# Patient Record
Sex: Female | Born: 1950 | Race: White | Hispanic: No | Marital: Married | Smoking: Never smoker
Health system: Southern US, Community
[De-identification: ages and names within clinical notes are randomized; demographics above are authoritative.]

## PROBLEM LIST (undated history)

## (undated) DIAGNOSIS — I499 Cardiac arrhythmia, unspecified: Secondary | ICD-10-CM

## (undated) DIAGNOSIS — F329 Major depressive disorder, single episode, unspecified: Secondary | ICD-10-CM

## (undated) DIAGNOSIS — F988 Other specified behavioral and emotional disorders with onset usually occurring in childhood and adolescence: Secondary | ICD-10-CM

## (undated) DIAGNOSIS — I639 Cerebral infarction, unspecified: Secondary | ICD-10-CM

## (undated) DIAGNOSIS — I341 Nonrheumatic mitral (valve) prolapse: Secondary | ICD-10-CM

## (undated) DIAGNOSIS — I480 Paroxysmal atrial fibrillation: Secondary | ICD-10-CM

## (undated) DIAGNOSIS — I514 Myocarditis, unspecified: Secondary | ICD-10-CM

## (undated) DIAGNOSIS — F32A Depression, unspecified: Secondary | ICD-10-CM

## (undated) DIAGNOSIS — G4730 Sleep apnea, unspecified: Secondary | ICD-10-CM

## (undated) DIAGNOSIS — C801 Malignant (primary) neoplasm, unspecified: Secondary | ICD-10-CM

## (undated) HISTORY — PX: CATARACT EXTRACTION: SUR2

## (undated) HISTORY — PX: OTHER SURGICAL HISTORY: SHX169

## (undated) HISTORY — DX: Myocarditis, unspecified: I51.4

## (undated) HISTORY — DX: Paroxysmal atrial fibrillation: I48.0

## (undated) HISTORY — DX: Cardiac arrhythmia, unspecified: I49.9

## (undated) HISTORY — PX: OOPHORECTOMY: SHX86

## (undated) HISTORY — DX: Cerebral infarction, unspecified: I63.9

## (undated) HISTORY — DX: Malignant (primary) neoplasm, unspecified: C80.1

---

## 1991-04-02 HISTORY — PX: BREAST SURGERY: SHX581

## 1999-04-02 DIAGNOSIS — C801 Malignant (primary) neoplasm, unspecified: Secondary | ICD-10-CM

## 1999-04-02 HISTORY — PX: BREAST LUMPECTOMY: SHX2

## 1999-04-02 HISTORY — DX: Malignant (primary) neoplasm, unspecified: C80.1

## 2009-08-15 ENCOUNTER — Encounter: Admission: RE | Admit: 2009-08-15 | Discharge: 2009-08-15 | Payer: Self-pay | Admitting: Family Medicine

## 2013-04-01 DIAGNOSIS — I639 Cerebral infarction, unspecified: Secondary | ICD-10-CM

## 2013-04-01 HISTORY — DX: Cerebral infarction, unspecified: I63.9

## 2013-07-20 ENCOUNTER — Ambulatory Visit (INDEPENDENT_AMBULATORY_CARE_PROVIDER_SITE_OTHER): Payer: No Typology Code available for payment source | Admitting: General Surgery

## 2013-07-20 ENCOUNTER — Telehealth (INDEPENDENT_AMBULATORY_CARE_PROVIDER_SITE_OTHER): Payer: Self-pay | Admitting: *Deleted

## 2013-07-20 ENCOUNTER — Encounter (INDEPENDENT_AMBULATORY_CARE_PROVIDER_SITE_OTHER): Payer: Self-pay | Admitting: General Surgery

## 2013-07-20 VITALS — BP 126/70 | HR 72 | Temp 98.7°F | Resp 14 | Ht 66.0 in | Wt 150.4 lb

## 2013-07-20 DIAGNOSIS — R928 Other abnormal and inconclusive findings on diagnostic imaging of breast: Secondary | ICD-10-CM

## 2013-07-20 NOTE — Patient Instructions (Signed)
The small mass in your right breast has been biopsied. It shows a complex sclerosing lesion or possibly a radial scar. This is not cancer, but there is low but definite risk that there could be an early breast cancer in the same area.  Because of this, and because of your past history of breast cancer, this area should be conservatively excised, as we have discussed.  You will be scheduled for a right breast lumpectomy with radioactive seed localization in the near future.     Lumpectomy A lumpectomy is a form of "breast conserving" or "breast preservation" surgery. It may also be referred to as a partial mastectomy. During a lumpectomy, the portion of the breast that contains the cancerous tumor or breast mass (the lump) is removed. Some normal tissue around the lump may also be removed to make sure all the tumor has been removed. This surgery should take 40 minutes or less. LET [facility] CARE PROVIDER KNOW ABOUT:  Any allergies you have.  All medicines you are taking, including vitamins, herbs, eye drops, creams, and over-the-counter medicines.  Previous problems you or members of your family have had with the use of anesthetics.  Any blood disorders you have.  Previous surgeries you have had.  Medical conditions you have. RISKS AND COMPLICATIONS Generally, this is a safe procedure. However, as with any procedure, complications can occur. Possible complications include:  Bleeding.  Infection.  Pain.  Temporary swelling.  Change in the shape of the breast, particularly if a large portion is removed. BEFORE THE PROCEDURE  Ask your health care provider about changing or stopping your regular medicines.  Do not eat or drink anything for 7 8 hours before the surgery or as directed by your health care provider. Ask your health care provider if you can take a sip of water with any approved medicines.  On the day of surgery, your healthcare provider will use a mammogram or  ultrasound to locate and mark the tumor in your breast. These markings on your breast will show where the cut (incision) will be made. PROCEDURE   An IV tube will be put into one of your veins.  You may be given medicine to help you relax before the surgery (sedative). You will be given one of the following:  A medicine that numbs the area (local anesthesia).  A medicine that makes you go to sleep (general anesthesia).  Your health care provider will use a kind of electric scalpel that uses heat to minimize bleeding (electrocautery knife).  A curved incision (like a smile or frown) that follows the natural curve of your breast is made, to allow for minimal scarring and better healing.  The tumor will be removed with some of the surrounding tissue. This will be sent to the lab for analysis. Your health care provider may also remove your lymph nodes at this time if needed.  Sometimes, but not always, a rubber tube called a drain will be surgically inserted into your breast area or armpit to collect excess fluid that may accumulate in the space where the tumor was. This drain is connected to a plastic bulb on the outside of your body. This drain creates suction to help remove the fluid.  The incisions will be closed with stitches (sutures).  A bandage may be placed over the incisions. AFTER THE PROCEDURE  You will be taken to the recovery area.  You will be given medicine for pain.  A small rubber drain may be placed  in the breast for 2 3 days to prevent a collection of blood (hematoma) from developing in the breast. You will be given instructions on caring for the drain before you go home.  A pressure bandage (dressing) will be applied for 1 2 days to prevent bleeding. Ask your health care provider how to care for your bandage at home. Document Released: 04/29/2006 Document Revised: 11/18/2012 Document Reviewed: 08/21/2012 [facility] Patient Information 2014 [place].

## 2013-07-20 NOTE — Telephone Encounter (Signed)
Pt's films have been placed for pick up by [facility].

## 2013-07-20 NOTE — Progress Notes (Signed)
Patient ID: Ashley White, female   DOB: [birth year], 63 y.o.   MRN: [id number]  Chief Complaint  Patient presents with  . New Evaluation    eval Rt sclerosing lesion    HPI Ashley White is a 63 y.o. female.  She is referred by Dr. Marcelo Baldy at [facility] for evaluation and management of a small right breast mass at 6:00 position which has been biopsied, showing a complex sclerosing lesion or a radial scar. Dr. Matilde Haymaker at [place]  is her PCP.   The patient has a history of a left partial mastectomy and sentinel node biopsy in [place] in 2001. She says this was stage I, she received radiation therapy, chemotherapy and 5 years of tamoxifen. No known recurrence. She also had a left breast biopsy for benign process in 1993.  She and her husband moved here in 2011. Last mammogram was about 18 months ago. Recent screening mammogram showed a 4 mm mass in the right breast, middle depth, 6:00 position that was taller than wide. Ultrasound confirmed the same dimensions. Axilla looked normal. Old lumpectomy scar in the upper left breast was noted but no abnormal findings on the left.  US guided biopsy shows sclerosing lesion, benign, consistent with radial scar or complex sclerosing lesion. She was referred for conservative excision  Morbidities include mitral valve prolapse but no sequelae and no regurgitation, cataract surgery, left salpingo-oophorectomy for dermoid tumor, but otherwise basically very healthy.  Family history reveals breast cancer in a paternal grandmother in her 17s.  HPI  Past Medical History  Diagnosis Date  . Heart murmur   . Cancer 2001    left breast    History reviewed. No pertinent past surgical history.  Family History  Problem Relation Age of Onset  . Heart disease Mother   . Cancer Father     colon  . Cancer Paternal Grandmother     breast    Social History History  Substance Use Topics  . Smoking status: Never Smoker   . Smokeless tobacco: Never Used  .  Alcohol Use: Yes     Comment: occasionally    Allergies  Allergen Reactions  . Penicillins     Current Outpatient Prescriptions  Medication Sig Dispense Refill  . cetirizine (ZYRTEC) 10 MG tablet Take 10 mg by mouth daily.      . fluticasone (FLONASE) 50 MCG/ACT nasal spray       . lamoTRIgine (LAMICTAL) 100 MG tablet       . PARoxetine (PAXIL) 20 MG tablet        No current facility-administered medications for this visit.    Review of Systems Review of Systems  Constitutional: Negative for fever, chills and unexpected weight change.  HENT: Negative for congestion, hearing loss, sore throat, trouble swallowing and voice change.   Eyes: Negative for visual disturbance.  Respiratory: Negative for cough and wheezing.        Allergies  Cardiovascular: Negative for chest pain, palpitations and leg swelling.  Gastrointestinal: Negative for nausea, vomiting, abdominal pain, diarrhea, constipation, blood in stool, abdominal distention and anal bleeding.  Genitourinary: Negative for hematuria, vaginal bleeding and difficulty urinating.  Musculoskeletal: Negative for arthralgias.  Skin: Negative for rash and wound.  Neurological: Negative for seizures, syncope and headaches.  Hematological: Negative for adenopathy. Does not bruise/bleed easily.  Psychiatric/Behavioral: Negative for confusion.    Blood pressure 126/70, pulse 72, temperature 98.7 F (37.1 C), temperature source Oral, resp. rate 14, height 5\' 6"  (  1.676 m), weight 150 lb 6.4 oz (68.221 kg).  Physical Exam Physical Exam  Constitutional: She is oriented to person, place, and time. She appears well-developed and well-nourished. No distress.  HENT:  Head: Normocephalic and atraumatic.  Nose: Nose normal.  Mouth/Throat: No oropharyngeal exudate.  Eyes: Conjunctivae and EOM are normal. Pupils are equal, round, and reactive to light. Left eye exhibits no discharge. No scleral icterus.  Neck: Neck supple. No JVD present. No  tracheal deviation present. No thyromegaly present.  Cardiovascular: Normal rate, regular rhythm, normal heart sounds and intact distal pulses.   No murmur heard. Pulmonary/Chest: Effort normal and breath sounds normal. No respiratory distress. She has no wheezes. She has no rales. She exhibits no tenderness.    2 scars left breast, left axillary scar. No mass or axillary mass in the left. Right breast reveals a small bruise and biopsy site 6:00. No mass no other skin changes. No axillary adenopathy.  Abdominal: Soft. Bowel sounds are normal. She exhibits no distension and no mass. There is no tenderness. There is no rebound and no guarding.  Small scar at the lower umbilical rim  Musculoskeletal: She exhibits no edema and no tenderness.  Lymphadenopathy:    She has no cervical adenopathy.  Neurological: She is alert and oriented to person, place, and time. She exhibits normal muscle tone. Coordination normal.  Skin: Skin is warm. No rash noted. She is not diaphoretic. No erythema. No pallor.  Psychiatric: She has a normal mood and affect. Her behavior is normal. Judgment and thought content normal.    Data Reviewed Mammograms, ultrasound, pathology report.  Assessment    4 mm right breast mass, 6 clock position, complex coursing lesion or radial scar. Conservative excision is indicated to exclude occult breast cancer  History of stage I invasive cancer left breast, upper inner quadrant. Diagnosis 2001. No evidence of recurrence 14 years following lobectomy, sentinel node biopsy, radiation therapy, chemotherapy, and tamoxifen.  Mitral valve prolapse, asymptomatic History cataract surgery History right tibial plateau fracture History left salpingo-oophorectomy for dermoid tumor     Plan    Scheduled for a left lumpectomy with radioactive seed localization.  I discussed the indications, details, techniques, and numerous risk of the surgery with the patient and her husband. She's  aware of the risk of bleeding, infection, cosmetic deformity, nerve damage, chronic pain, reoperation if cancer with positive margins, cardiac pulmonary and thromboembolic problems. She understands these issues well. At this time all her questions were answered. She agrees with this plan.        Edsel Petrin. Dalbert Batman, M.D., [facility] General and Minimally invasive Surgery Breast and Colorectal Surgery Office:   [phone] Pager:   [phone]  07/20/2013, 5:28 PM

## 2013-07-21 ENCOUNTER — Ambulatory Visit (INDEPENDENT_AMBULATORY_CARE_PROVIDER_SITE_OTHER): Payer: No Typology Code available for payment source | Admitting: General Surgery

## 2013-08-05 ENCOUNTER — Encounter (HOSPITAL_BASED_OUTPATIENT_CLINIC_OR_DEPARTMENT_OTHER): Payer: Self-pay | Admitting: *Deleted

## 2013-08-05 NOTE — Progress Notes (Signed)
Bring all medications and CPAP machine. Requested sleep study, last office notes and last EKG from Dr. Nehemiah Settle office.

## 2013-08-06 NOTE — H&P (Signed)
Ashley White   MRN:  [id number]   Description: 63 year old female  Provider: Adin Hector, MD  Department: [facility]               Diagnoses      Abnormal mammogram    -  Primary      793.80             Reason for Visit      New Evaluation      eval Rt sclerosing lesion              Current Vitals Most recent update: 07/20/2013  4:23 PM by Jeanann Lewandowsky, CMA      BP Pulse Temp(Src) Resp Ht Wt      126/70 72 98.7 F (37.1 C) (Oral) 14 5\' 6"  (1.676 m) 150 lb 6.4 oz (68.221 kg)      BMI 24.29 kg/m2                  Vitals History Recorded              History and Physical    Adin Hector, MD    Status: Signed            Patient ID: Ashley White, female   DOB: [birth year], 63 y.o.   MRN: [id number]            Note: This dictation was prepared with Dragon/digital dictation along with [facility] technology. Any transcriptional errors that result from this process are unintentional.    HPI Ashley White is a 63 y.o. female.  She is referred by Dr. Marcelo Baldy at [facility] for evaluation and management of a small right breast mass at 6:00 position which has been biopsied, showing a complex sclerosing lesion or a radial scar. Dr. Matilde Haymaker at [place]  is her PCP.    The patient has a history of a left partial mastectomy and sentinel node biopsy in [place] in 2001. She says this was stage I, she received radiation therapy, chemotherapy and 5 years of tamoxifen. No known recurrence. She also had a left breast biopsy for benign process in 1993.   She and her husband moved here in 2011. Last mammogram was about 18 months ago. Recent screening mammogram showed a 4 mm mass in the right breast, middle depth, 6:00 position that was taller than wide. Ultrasound confirmed the same dimensions. Axilla looked normal. Old lumpectomy scar in the upper left breast was noted but no abnormal findings on the left.   US guided biopsy shows sclerosing lesion, benign,  consistent with radial scar or complex sclerosing lesion. She was referred for conservative excision   Morbidities include mitral valve prolapse but no sequelae and no regurgitation, cataract surgery, left salpingo-oophorectomy for dermoid tumor, but otherwise basically very healthy.   Family history reveals breast cancer in a paternal grandmother in her 74s.         Past Medical History   Diagnosis  Date   .  Heart murmur     .  Cancer  2001       left breast        History reviewed. No pertinent past surgical history.    Family History   Problem  Relation  Age of Onset   .  Heart disease  Mother     .  Cancer  Father         colon   .  Cancer  Paternal Grandmother         breast        Social History History   Substance Use Topics   .  Smoking status:  Never Smoker    .  Smokeless tobacco:  Never Used   .  Alcohol Use:  Yes         Comment: occasionally         Allergies   Allergen  Reactions   .  Penicillins           Current Outpatient Prescriptions   Medication  Sig  Dispense  Refill   .  cetirizine (ZYRTEC) 10 MG tablet  Take 10 mg by mouth daily.         .  fluticasone (FLONASE) 50 MCG/ACT nasal spray           .  lamoTRIgine (LAMICTAL) 100 MG tablet           .  PARoxetine (PAXIL) 20 MG tablet                     Review of Systems   Constitutional: Negative for fever, chills and unexpected weight change.  HENT: Negative for congestion, hearing loss, sore throat, trouble swallowing and voice change.   Eyes: Negative for visual disturbance.  Respiratory: Negative for cough and wheezing.         Allergies  Cardiovascular: Negative for chest pain, palpitations and leg swelling.  Gastrointestinal: Negative for nausea, vomiting, abdominal pain, diarrhea, constipation, blood in stool, abdominal distention and anal bleeding.  Genitourinary: Negative for hematuria, vaginal bleeding and difficulty urinating.  Musculoskeletal: Negative for  arthralgias.  Skin: Negative for rash and wound.  Neurological: Negative for seizures, syncope and headaches.  Hematological: Negative for adenopathy. Does not bruise/bleed easily.  Psychiatric/Behavioral: Negative for confusion.      Blood pressure 126/70, pulse 72, temperature 98.7 F (37.1 C), temperature source Oral, resp. rate 14, height 5\' 6"  (1.676 m), weight 150 lb 6.4 oz (68.221 kg).   Physical Exam   Constitutional: She is oriented to person, place, and time. She appears well-developed and well-nourished. No distress.  HENT:   Head: Normocephalic and atraumatic.   Nose: Nose normal.   Mouth/Throat: No oropharyngeal exudate.  Eyes: Conjunctivae and EOM are normal. Pupils are equal, round, and reactive to light. Left eye exhibits no discharge. No scleral icterus.  Neck: Neck supple. No JVD present. No tracheal deviation present. No thyromegaly present.  Cardiovascular: Normal rate, regular rhythm, normal heart sounds and intact distal pulses.    No murmur heard. Pulmonary/Chest: Effort normal and breath sounds normal. No respiratory distress. She has no wheezes. She has no rales. She exhibits no tenderness.    2 scars left breast, left axillary scar. No mass or axillary mass in the left. Right breast reveals a small bruise and biopsy site 6:00. No mass no other skin changes. No axillary adenopathy.  Abdominal: Soft. Bowel sounds are normal. She exhibits no distension and no mass. There is no tenderness. There is no rebound and no guarding.  Small scar at the lower umbilical rim  Musculoskeletal: She exhibits no edema and no tenderness.  Lymphadenopathy:    She has no cervical adenopathy.  Neurological: She is alert and oriented to person, place, and time. She exhibits normal muscle tone. Coordination normal.  Skin: Skin is warm. No rash noted. She is not diaphoretic. No erythema. No pallor.  Psychiatric: She has a normal mood and  affect. Her behavior is normal. Judgment and  thought content normal.      Data Reviewed Mammograms, ultrasound, pathology report.   Assessment    4 mm right breast mass, 6 clock position, complex sclerosing lesion or radial scar. Conservative excision is indicated to exclude occult breast cancer   History of stage I invasive cancer left breast, upper inner quadrant. Diagnosis 2001. No evidence of recurrence 14 years following lobectomy, sentinel node biopsy, radiation therapy, chemotherapy, and tamoxifen.   Mitral valve prolapse, asymptomatic History cataract surgery History right tibial plateau fracture History left salpingo-oophorectomy for dermoid tumor      Plan    Scheduled for a left lumpectomy with radioactive seed localization.   I discussed the indications, details, techniques, and numerous risk of the surgery with the patient and her husband. She's aware of the risk of bleeding, infection, cosmetic deformity, nerve damage, chronic pain, reoperation if cancer with positive margins, cardiac pulmonary and thromboembolic problems. She understands these issues well. At this time all her questions were answered. She agrees with this plan.           Edsel Petrin. Dalbert Batman, M.D., [facility] General and Minimally invasive Surgery Breast and Colorectal Surgery Office:   [phone] Pager:   [phone]

## 2013-08-09 ENCOUNTER — Encounter (HOSPITAL_BASED_OUTPATIENT_CLINIC_OR_DEPARTMENT_OTHER): Payer: No Typology Code available for payment source | Admitting: Certified Registered"

## 2013-08-09 ENCOUNTER — Ambulatory Visit (HOSPITAL_BASED_OUTPATIENT_CLINIC_OR_DEPARTMENT_OTHER)
Admission: RE | Admit: 2013-08-09 | Discharge: 2013-08-09 | Disposition: A | Discharge status: Home / Self Care | Payer: No Typology Code available for payment source | Source: Ambulatory Visit | Attending: General Surgery | Admitting: General Surgery

## 2013-08-09 ENCOUNTER — Encounter (HOSPITAL_BASED_OUTPATIENT_CLINIC_OR_DEPARTMENT_OTHER): Payer: Self-pay | Admitting: Certified Registered"

## 2013-08-09 ENCOUNTER — Encounter (HOSPITAL_BASED_OUTPATIENT_CLINIC_OR_DEPARTMENT_OTHER)
Admission: RE | Disposition: A | Discharge status: Home / Self Care | Payer: Self-pay | Source: Ambulatory Visit | Attending: General Surgery

## 2013-08-09 ENCOUNTER — Ambulatory Visit (HOSPITAL_BASED_OUTPATIENT_CLINIC_OR_DEPARTMENT_OTHER): Payer: No Typology Code available for payment source | Admitting: Certified Registered"

## 2013-08-09 DIAGNOSIS — R928 Other abnormal and inconclusive findings on diagnostic imaging of breast: Secondary | ICD-10-CM | POA: Diagnosis present

## 2013-08-09 DIAGNOSIS — R920 Mammographic microcalcification found on diagnostic imaging of breast: Secondary | ICD-10-CM

## 2013-08-09 DIAGNOSIS — L905 Scar conditions and fibrosis of skin: Secondary | ICD-10-CM | POA: Insufficient documentation

## 2013-08-09 DIAGNOSIS — N6019 Diffuse cystic mastopathy of unspecified breast: Secondary | ICD-10-CM | POA: Insufficient documentation

## 2013-08-09 DIAGNOSIS — Z853 Personal history of malignant neoplasm of breast: Secondary | ICD-10-CM

## 2013-08-09 DIAGNOSIS — Z923 Personal history of irradiation: Secondary | ICD-10-CM | POA: Insufficient documentation

## 2013-08-09 DIAGNOSIS — Z9221 Personal history of antineoplastic chemotherapy: Secondary | ICD-10-CM | POA: Insufficient documentation

## 2013-08-09 DIAGNOSIS — Z880 Allergy status to penicillin: Secondary | ICD-10-CM | POA: Insufficient documentation

## 2013-08-09 DIAGNOSIS — Z79899 Other long term (current) drug therapy: Secondary | ICD-10-CM | POA: Insufficient documentation

## 2013-08-09 DIAGNOSIS — N6089 Other benign mammary dysplasias of unspecified breast: Secondary | ICD-10-CM

## 2013-08-09 DIAGNOSIS — Z9010 Acquired absence of unspecified breast and nipple: Secondary | ICD-10-CM | POA: Insufficient documentation

## 2013-08-09 DIAGNOSIS — Z803 Family history of malignant neoplasm of breast: Secondary | ICD-10-CM | POA: Insufficient documentation

## 2013-08-09 HISTORY — DX: Nonrheumatic mitral (valve) prolapse: I34.1

## 2013-08-09 HISTORY — DX: Other specified behavioral and emotional disorders with onset usually occurring in childhood and adolescence: F98.8

## 2013-08-09 HISTORY — DX: Major depressive disorder, single episode, unspecified: F32.9

## 2013-08-09 HISTORY — DX: Depression, unspecified: F32.A

## 2013-08-09 HISTORY — PX: BREAST LUMPECTOMY WITH RADIOACTIVE SEED LOCALIZATION: SHX6424

## 2013-08-09 HISTORY — DX: Sleep apnea, unspecified: G47.30

## 2013-08-09 LAB — POCT HEMOGLOBIN-HEMACUE: Hemoglobin: 14.6 g/dL (ref 12.0–15.0)

## 2013-08-09 SURGERY — BREAST LUMPECTOMY WITH RADIOACTIVE SEED LOCALIZATION
Anesthesia: General | Laterality: Right

## 2013-08-09 MED ORDER — HYDROCODONE-ACETAMINOPHEN 5-325 MG PO TABS: 1.0000 | ORAL_TABLET | Freq: Four times a day (QID) | ORAL | Status: DC | PRN

## 2013-08-09 MED ORDER — MIDAZOLAM HCL 5 MG/5ML IJ SOLN
INTRAMUSCULAR | Status: DC | PRN
  Administered 2013-08-09: 2 mg via INTRAVENOUS

## 2013-08-09 MED ORDER — BUPIVACAINE-EPINEPHRINE (PF) 0.5% -1:200000 IJ SOLN
INTRAMUSCULAR | Status: DC | PRN
  Administered 2013-08-09: 7 mL

## 2013-08-09 MED ORDER — FENTANYL CITRATE 0.05 MG/ML IJ SOLN
INTRAMUSCULAR | Status: AC
  Filled 2013-08-09: qty 6

## 2013-08-09 MED ORDER — FENTANYL CITRATE 0.05 MG/ML IJ SOLN: 50.0000 ug | INTRAMUSCULAR | Status: DC | PRN

## 2013-08-09 MED ORDER — HYDROMORPHONE HCL PF 1 MG/ML IJ SOLN
0.2500 mg | INTRAMUSCULAR | Status: DC | PRN
  Administered 2013-08-09: 0.5 mg via INTRAVENOUS

## 2013-08-09 MED ORDER — FENTANYL CITRATE 0.05 MG/ML IJ SOLN
INTRAMUSCULAR | Status: DC | PRN
  Administered 2013-08-09: 50 ug via INTRAVENOUS

## 2013-08-09 MED ORDER — MIDAZOLAM HCL 2 MG/2ML IJ SOLN
INTRAMUSCULAR | Status: AC
  Filled 2013-08-09: qty 2

## 2013-08-09 MED ORDER — CEFAZOLIN SODIUM-DEXTROSE 2-3 GM-% IV SOLR
2.0000 g | INTRAVENOUS | Status: AC
  Administered 2013-08-09: 2 g via INTRAVENOUS

## 2013-08-09 MED ORDER — DEXAMETHASONE SODIUM PHOSPHATE 4 MG/ML IJ SOLN
INTRAMUSCULAR | Status: DC | PRN
  Administered 2013-08-09: 10 mg via INTRAVENOUS

## 2013-08-09 MED ORDER — 0.9 % SODIUM CHLORIDE (POUR BTL) OPTIME
TOPICAL | Status: DC | PRN
  Administered 2013-08-09: 600 mL

## 2013-08-09 MED ORDER — OXYCODONE HCL 5 MG/5ML PO SOLN: 5.0000 mg | Freq: Once | ORAL | Status: AC | PRN

## 2013-08-09 MED ORDER — LACTATED RINGERS IV SOLN
INTRAVENOUS | Status: DC
  Administered 2013-08-09 (×2): via INTRAVENOUS

## 2013-08-09 MED ORDER — PROPOFOL 10 MG/ML IV BOLUS
INTRAVENOUS | Status: DC | PRN
  Administered 2013-08-09: 150 mg via INTRAVENOUS

## 2013-08-09 MED ORDER — PROMETHAZINE HCL 25 MG/ML IJ SOLN: 6.2500 mg | INTRAMUSCULAR | Status: DC | PRN

## 2013-08-09 MED ORDER — HYDROMORPHONE HCL PF 1 MG/ML IJ SOLN
INTRAMUSCULAR | Status: AC
  Filled 2013-08-09: qty 1

## 2013-08-09 MED ORDER — BUPIVACAINE-EPINEPHRINE (PF) 0.5% -1:200000 IJ SOLN
INTRAMUSCULAR | Status: AC
  Filled 2013-08-09: qty 30

## 2013-08-09 MED ORDER — CEFAZOLIN SODIUM-DEXTROSE 2-3 GM-% IV SOLR
INTRAVENOUS | Status: AC
  Filled 2013-08-09: qty 50

## 2013-08-09 MED ORDER — MIDAZOLAM HCL 2 MG/2ML IJ SOLN: 1.0000 mg | INTRAMUSCULAR | Status: DC | PRN

## 2013-08-09 MED ORDER — OXYCODONE HCL 5 MG PO TABS
ORAL_TABLET | ORAL | Status: AC
  Filled 2013-08-09: qty 1

## 2013-08-09 MED ORDER — LIDOCAINE HCL (CARDIAC) 20 MG/ML IV SOLN
INTRAVENOUS | Status: DC | PRN
  Administered 2013-08-09: 60 mg via INTRAVENOUS

## 2013-08-09 MED ORDER — OXYCODONE HCL 5 MG PO TABS
5.0000 mg | ORAL_TABLET | Freq: Once | ORAL | Status: AC | PRN
  Administered 2013-08-09: 5 mg via ORAL

## 2013-08-09 MED ORDER — ONDANSETRON HCL 4 MG/2ML IJ SOLN
INTRAMUSCULAR | Status: DC | PRN
  Administered 2013-08-09: 4 mg via INTRAVENOUS

## 2013-08-09 MED ORDER — CHLORHEXIDINE GLUCONATE 4 % EX LIQD: 1.0000 "application " | Freq: Once | CUTANEOUS | Status: DC

## 2013-08-09 SURGICAL SUPPLY — 61 items
APPLIER CLIP 9.375 MED OPEN (MISCELLANEOUS)
BENZOIN TINCTURE PRP APPL 2/3 (GAUZE/BANDAGES/DRESSINGS) IMPLANT
BINDER BREAST LRG (GAUZE/BANDAGES/DRESSINGS) IMPLANT
BINDER BREAST MEDIUM (GAUZE/BANDAGES/DRESSINGS) IMPLANT
BINDER BREAST XLRG (GAUZE/BANDAGES/DRESSINGS) IMPLANT
BINDER BREAST XXLRG (GAUZE/BANDAGES/DRESSINGS) IMPLANT
BLADE 10 SAFETY STRL DISP (BLADE) IMPLANT
BLADE 15 SAFETY STRL DISP (BLADE) ×2 IMPLANT
BLADE HEX COATED 2.75 (ELECTRODE) ×2 IMPLANT
CANISTER SUC SOCK COL 7IN (MISCELLANEOUS) ×2 IMPLANT
CANISTER SUCT 1200ML W/VALVE (MISCELLANEOUS) ×2 IMPLANT
CHLORAPREP W/TINT 26ML (MISCELLANEOUS) ×2 IMPLANT
CLIP APPLIE 9.375 MED OPEN (MISCELLANEOUS) IMPLANT
COVER MAYO STAND STRL (DRAPES) ×2 IMPLANT
COVER PROBE W GEL 5X96 (DRAPES) ×2 IMPLANT
COVER TABLE BACK 60X90 (DRAPES) ×2 IMPLANT
DECANTER SPIKE VIAL GLASS SM (MISCELLANEOUS) IMPLANT
DERMABOND ADVANCED (GAUZE/BANDAGES/DRESSINGS) ×1
DERMABOND ADVANCED .7 DNX12 (GAUZE/BANDAGES/DRESSINGS) ×1 IMPLANT
DEVICE DUBIN W/COMP PLATE 8390 (MISCELLANEOUS) ×2 IMPLANT
DRAIN CHANNEL 19F RND (DRAIN) IMPLANT
DRAPE LAPAROSCOPIC ABDOMINAL (DRAPES) ×2 IMPLANT
DRAPE UTILITY XL STRL (DRAPES) ×2 IMPLANT
DRSG PAD ABDOMINAL 8X10 ST (GAUZE/BANDAGES/DRESSINGS) IMPLANT
ELECT REM PT RETURN 9FT ADLT (ELECTROSURGICAL) ×2
ELECTRODE REM PT RTRN 9FT ADLT (ELECTROSURGICAL) ×1 IMPLANT
EVACUATOR SILICONE 100CC (DRAIN) IMPLANT
GLOVE BIOGEL PI IND STRL 6.5 (GLOVE) ×1 IMPLANT
GLOVE BIOGEL PI INDICATOR 6.5 (GLOVE) ×1
GLOVE ECLIPSE 6.5 STRL STRAW (GLOVE) ×2 IMPLANT
GLOVE EUDERMIC 7 POWDERFREE (GLOVE) ×2 IMPLANT
GLOVE EXAM NITRILE LRG STRL (GLOVE) ×2 IMPLANT
GOWN STRL REUS W/ TWL LRG LVL3 (GOWN DISPOSABLE) ×1 IMPLANT
GOWN STRL REUS W/ TWL XL LVL3 (GOWN DISPOSABLE) ×1 IMPLANT
GOWN STRL REUS W/TWL LRG LVL3 (GOWN DISPOSABLE) ×1
GOWN STRL REUS W/TWL XL LVL3 (GOWN DISPOSABLE) ×1
KIT MARKER MARGIN INK (KITS) IMPLANT
NEEDLE HYPO 25X1 1.5 SAFETY (NEEDLE) ×2 IMPLANT
NS IRRIG 1000ML POUR BTL (IV SOLUTION) ×2 IMPLANT
PACK BASIN DAY SURGERY FS (CUSTOM PROCEDURE TRAY) ×2 IMPLANT
PENCIL BUTTON HOLSTER BLD 10FT (ELECTRODE) ×2 IMPLANT
PIN SAFETY STERILE (MISCELLANEOUS) ×2 IMPLANT
SHEET MEDIUM DRAPE 40X70 STRL (DRAPES) IMPLANT
SLEEVE SCD COMPRESS KNEE MED (MISCELLANEOUS) ×2 IMPLANT
SPONGE GAUZE 4X4 12PLY STER LF (GAUZE/BANDAGES/DRESSINGS) IMPLANT
SPONGE LAP 18X18 X RAY DECT (DISPOSABLE) IMPLANT
SPONGE LAP 4X18 X RAY DECT (DISPOSABLE) ×2 IMPLANT
STRIP CLOSURE SKIN 1/2X4 (GAUZE/BANDAGES/DRESSINGS) IMPLANT
SUT ETHILON 3 0 FSL (SUTURE) IMPLANT
SUT MNCRL AB 4-0 PS2 18 (SUTURE) ×2 IMPLANT
SUT SILK 2 0 SH (SUTURE) ×2 IMPLANT
SUT VIC AB 2-0 CT1 27 (SUTURE)
SUT VIC AB 2-0 CT1 TAPERPNT 27 (SUTURE) IMPLANT
SUT VIC AB 3-0 SH 27 (SUTURE)
SUT VIC AB 3-0 SH 27X BRD (SUTURE) IMPLANT
SUT VICRYL 3-0 CR8 SH (SUTURE) ×2 IMPLANT
SYR CONTROL 10ML LL (SYRINGE) ×2 IMPLANT
TOWEL OR 17X24 6PK STRL BLUE (TOWEL DISPOSABLE) ×2 IMPLANT
TOWEL OR NON WOVEN STRL DISP B (DISPOSABLE) ×2 IMPLANT
TUBE CONNECTING 20X1/4 (TUBING) ×2 IMPLANT
YANKAUER SUCT BULB TIP NO VENT (SUCTIONS) ×2 IMPLANT

## 2013-08-09 NOTE — Op Note (Signed)
Patient Name:           Ashley White   Date of Surgery:        08/09/2013  Note: This dictation was prepared with Dragon/digital dictation along with [facility] technology. Any transcriptional errors that result from this process are unintentional.   Pre op Diagnosis:    4 mm right breast mass, 6 clock position, complex sclerosing lesion or radial scar.  Post op Diagnosis:    Same  Procedure:                 Left breast lumpectomy with radioactive seed localization  Surgeon:                     Edsel Petrin. Dalbert Batman, M.D., FACS  Assistant:                      None   Operative Indications:   Ashley White is a 63 y.o. female. She is referred by Dr. Marcelo Baldy at [facility] for evaluation and management of a small right breast mass at 6:00 position which has been biopsied, showing a complex sclerosing lesion or a radial scar. Dr. Matilde Haymaker at [place] is her PCP.  The patient has a history of a left partial mastectomy and sentinel node biopsy in [place] in 2001. She says this was stage I, she received radiation therapy, chemotherapy and 5 years of tamoxifen. No known recurrence.  She also had a left breast biopsy for benign process in 1993.  She and her husband moved here in 2011. Last mammogram was about 18 months ago. Recent screening mammogram showed a 4 mm mass in the right breast, middle depth, 6:00 position that was taller than wide. Ultrasound confirmed the same dimensions. Axilla looked normal. Old lumpectomy scar in the upper left breast was noted but no abnormal findings on the left.  US guided biopsy shows sclerosing lesion, benign, consistent with radial scar or complex sclerosing lesion. She was referred for conservative excision Exam reveals no palpable mass. She is brought to the operating room electively  Operative Findings:       Preoperatively we could localize the radiopharmaceutical at the 6:00 position of the left breast. The specimen mammogram showed a single radioactive seed.  Once the specimen was removed all the radioactivity was in the specimen and there was no radioactivity remaining in the breast. The more biopsy marker clip and the radioactive seeds appear centered within the specimen.  Procedure in Detail:          The radioactive seed was inserted into the left breast a few days ago. The patient was brought to the operating room and underwent general anesthesia with LMA device. The left breast was prepped and draped in a sterile fashion. The neoprobe isolated the maximal radioactivity which was at the 6:00 position about 2 cm below the areolar margin. 0.5% Marcaine with epinephrine was used as local infiltration anesthetic. A radially oriented incision was made at the 6 clock position the left breast. Dissection was carried into the breast tissue, and using the neoprobe as a guide, I dissected around the radioactivity. The specimen was removed. Specimen mammogram looked good as described above. The specimen was marked and sent to pathology. Hemostasis was excellent and achieved with electrocautery. It was irrigated with saline. The breast layers were closed with multiple interrupted sutures of 3-0 Vicryl and the skin closed with a running subcuticular suture of 4-0 Monocryl and  Dermabond. Patient tolerated procedure well taken to PACU in stable condition. EBL 10 cc. Counts correct. Complications none.     Edsel Petrin. Dalbert Batman, M.D., FACS General and Minimally Invasive Surgery Breast and Colorectal Surgery  08/09/2013 1:23 PM

## 2013-08-09 NOTE — Interval H&P Note (Signed)
History and Physical Interval Note:  08/09/2013 12:18 PM  Ashley White  has presented today for surgery, with the diagnosis of Abnormal mammogram right breast  The goals and the  various methods of treatment have been discussed with the patient and family. After consideration of risks, benefits and other options for treatment, the patient has consented to  Procedure(s): BREAST LUMPECTOMY WITH RADIOACTIVE SEED LOCALIZATION (Right) as a surgical intervention .  The patient's history has been reviewed, patient examined today, no change in status, stable for surgery.  I have reviewed the patient's chart and labs.  Questions were answered to the patient's satisfaction.     Adin Hector

## 2013-08-09 NOTE — Anesthesia Postprocedure Evaluation (Signed)
  Anesthesia Post-op Note  Patient: Ashley White  Procedure(s) Performed: Procedure(s): BREAST LUMPECTOMY WITH RADIOACTIVE SEED LOCALIZATION (Right)  Patient Location: PACU  Anesthesia Type:General  Level of Consciousness: awake and alert   Airway and Oxygen Therapy: Patient Spontanous Breathing  Post-op Pain: mild  Post-op Assessment: Post-op Vital signs reviewed  Post-op Vital Signs: stable  Last Vitals:  Filed Vitals:   08/09/13 1507  BP: 132/69  Pulse: 72  Temp: 36.4 C  Resp: 16    Complications: No apparent anesthesia complications

## 2013-08-09 NOTE — Anesthesia Procedure Notes (Addendum)
Procedure Name: LMA Insertion Date/Time: 08/09/2013 12:44 PM Performed by: Vercie Pokorny Pre-anesthesia Checklist: Patient identified, Emergency Drugs available, Suction available and Patient being monitored Patient Re-evaluated:Patient Re-evaluated prior to inductionOxygen Delivery Method: Circle System Utilized Preoxygenation: Pre-oxygenation with 100% oxygen Intubation Type: IV induction Ventilation: Mask ventilation without difficulty LMA: LMA inserted LMA Size: 4.0 Number of attempts: 1 Airway Equipment and Method: bite block Placement Confirmation: positive ETCO2 Tube secured with: Tape Dental Injury: Teeth and Oropharynx as per pre-operative assessment    Procedure Name: LMA Insertion Date/Time: 08/09/2013 12:44 PM Performed by: Dalton Mille Pre-anesthesia Checklist: Patient identified, Emergency Drugs available, Suction available and Patient being monitored Patient Re-evaluated:Patient Re-evaluated prior to inductionOxygen Delivery Method: Circle System Utilized Preoxygenation: Pre-oxygenation with 100% oxygen Intubation Type: IV induction Ventilation: Mask ventilation without difficulty LMA: LMA inserted LMA Size: 4.0 Number of attempts: 1 Airway Equipment and Method: bite block Placement Confirmation: positive ETCO2 Tube secured with: Tape Dental Injury: Teeth and Oropharynx as per pre-operative assessment

## 2013-08-09 NOTE — Discharge Instructions (Signed)
[facility] °Office Phone Number [phone] ° °BREAST BIOPSY/ PARTIAL MASTECTOMY: POST OP INSTRUCTIONS ° °Always review your discharge instruction sheet given to you by the facility where your surgery was performed. ° °IF YOU HAVE DISABILITY OR FAMILY LEAVE FORMS, YOU MUST BRING THEM TO THE OFFICE FOR PROCESSING.  DO NOT GIVE THEM TO YOUR DOCTOR. ° °1. A prescription for pain medication may be given to you upon discharge.  Take your pain medication as prescribed, if needed.  If narcotic pain medicine is not needed, then you may take acetaminophen (Tylenol) or ibuprofen (Advil) as needed. °2. Take your usually prescribed medications unless otherwise directed °3. If you need a refill on your pain medication, please contact your pharmacy.  They will contact our office to request authorization.  Prescriptions will not be filled after 5pm or on week-ends. °4. You should eat very light the first 24 hours after surgery, such as soup, crackers, pudding, etc.  Resume your normal diet the day after surgery. °5. Most patients will experience some swelling and bruising in the breast.  Ice packs and a good support bra will help.  Swelling and bruising can take several days to resolve.  °6. It is common to experience some constipation if taking pain medication after surgery.  Increasing fluid intake and taking a stool softener will usually help or prevent this problem from occurring.  A mild laxative (Milk of Magnesia or Miralax) should be taken according to package directions if there are no bowel movements after 48 hours. °7. Unless discharge instructions indicate otherwise, you may remove your bandages 24-48 hours after surgery, and you may shower at that time.  You may have steri-strips (small skin tapes) in place directly over the incision.  These strips should be left on the skin for 7-10 days.  If your surgeon used skin glue on the incision, you may shower in 24 hours.  The glue will flake off over the  next 2-3 weeks.  Any sutures or staples will be removed at the office during your follow-up visit. °8. ACTIVITIES:  You may resume regular daily activities (gradually increasing) beginning the next day.  Wearing a good support bra or sports bra minimizes pain and swelling.  You may have sexual intercourse when it is comfortable. °a. You may drive when you no longer are taking prescription pain medication, you can comfortably wear a seatbelt, and you can safely maneuver your car and apply brakes. °b. RETURN TO WORK:  ______________________________________________________________________________________ °9. You should see your doctor in the office for a follow-up appointment approximately two weeks after your surgery.  Your doctor’s nurse will typically make your follow-up appointment when she calls you with your pathology report.  Expect your pathology report 2-3 business days after your surgery.  You may call to check if you do not hear from us after three days. °10. OTHER INSTRUCTIONS: _______________________________________________________________________________________________ _____________________________________________________________________________________________________________________________________ °_____________________________________________________________________________________________________________________________________ °_____________________________________________________________________________________________________________________________________ ° °WHEN TO CALL YOUR DOCTOR: °1. Fever over 101.0 °2. Nausea and/or vomiting. °3. Extreme swelling or bruising. °4. Continued bleeding from incision. °5. Increased pain, redness, or drainage from the incision. ° °The clinic staff is available to answer your questions during regular business hours.  Please don’t hesitate to call and ask to speak to one of the nurses for clinical concerns.  If you have a medical emergency, go to the nearest  emergency room or call 911.  A surgeon from [facility] is always on call at the hospital. ° °For further questions, please visit [email or web address]  ° ° °  Post Anesthesia Home Care Instructions ° °Activity: °Get plenty of rest for the remainder of the day. A responsible adult should stay with you for 24 hours following the procedure.  °For the next 24 hours, DO NOT: °-Drive a car °-Operate machinery °-Drink alcoholic beverages °-Take any medication unless instructed by your physician °-Make any legal decisions or sign important papers. ° °Meals: °Start with liquid foods such as gelatin or soup. Progress to regular foods as tolerated. Avoid greasy, spicy, heavy foods. If nausea and/or vomiting occur, drink only clear liquids until the nausea and/or vomiting subsides. Call your physician if vomiting continues. ° °Special Instructions/Symptoms: °Your throat may feel dry or sore from the anesthesia or the breathing tube placed in your throat during surgery. If this causes discomfort, gargle with warm salt water. The discomfort should disappear within 24 hours. ° °

## 2013-08-09 NOTE — Transfer of Care (Signed)
Immediate Anesthesia Transfer of Care Note  Patient: Ashley White  Procedure(s) Performed: Procedure(s): BREAST LUMPECTOMY WITH RADIOACTIVE SEED LOCALIZATION (Right)  Patient Location: PACU  Anesthesia Type:General  Level of Consciousness: awake, alert , oriented and patient cooperative  Airway & Oxygen Therapy: Patient Spontanous Breathing and Patient connected to face mask oxygen  Post-op Assessment: Report given to PACU RN and Post -op Vital signs reviewed and stable  Post vital signs: Reviewed and stable  Complications: No apparent anesthesia complications

## 2013-08-09 NOTE — Anesthesia Preprocedure Evaluation (Signed)
Anesthesia Evaluation  Patient identified by MRN, date of birth, ID band Patient awake    Airway Mallampati: I  Neck ROM: Full    Dental  (+) Teeth Intact   Pulmonary sleep apnea ,  breath sounds clear to auscultation        Cardiovascular negative cardio ROS  + Valvular Problems/Murmurs MR Rhythm:Regular Rate:Normal  MVP   Neuro/Psych    GI/Hepatic negative GI ROS, Neg liver ROS,   Endo/Other  negative endocrine ROS  Renal/GU negative Renal ROS     Musculoskeletal   Abdominal   Peds  Hematology negative hematology ROS (+)   Anesthesia Other Findings   Reproductive/Obstetrics                           Anesthesia Physical Anesthesia Plan  ASA: II  Anesthesia Plan: General   Post-op Pain Management:    Induction: Intravenous  Airway Management Planned: LMA  Additional Equipment:   Intra-op Plan:   Post-operative Plan: Extubation in OR  Informed Consent: I have reviewed the patients History and Physical, chart, labs and discussed the procedure including the risks, benefits and alternatives for the proposed anesthesia with the patient or authorized representative who has indicated his/her understanding and acceptance.   Dental advisory given  Plan Discussed with: CRNA and Surgeon  Anesthesia Plan Comments:         Anesthesia Quick Evaluation

## 2013-08-10 ENCOUNTER — Encounter (HOSPITAL_BASED_OUTPATIENT_CLINIC_OR_DEPARTMENT_OTHER): Payer: Self-pay | Admitting: General Surgery

## 2013-08-11 NOTE — Progress Notes (Signed)
Quick Note:  Inform patient of Pathology report,.Tell her that there is no evidence of malignancy. Obviously excellent news. I will discuss in detail with her at next office visit.  hmi ______

## 2013-08-12 ENCOUNTER — Telehealth (INDEPENDENT_AMBULATORY_CARE_PROVIDER_SITE_OTHER): Payer: Self-pay

## 2013-08-12 NOTE — Telephone Encounter (Signed)
Per Dr Darrel Hoover request pt notified of path result. Pt will keep 5-26 ov.

## 2013-08-24 ENCOUNTER — Encounter (INDEPENDENT_AMBULATORY_CARE_PROVIDER_SITE_OTHER): Payer: Self-pay | Admitting: General Surgery

## 2013-08-24 ENCOUNTER — Ambulatory Visit (INDEPENDENT_AMBULATORY_CARE_PROVIDER_SITE_OTHER): Payer: No Typology Code available for payment source | Admitting: General Surgery

## 2013-08-24 ENCOUNTER — Other Ambulatory Visit (INDEPENDENT_AMBULATORY_CARE_PROVIDER_SITE_OTHER): Payer: Self-pay

## 2013-08-24 VITALS — BP 126/74 | HR 75 | Temp 97.2°F | Ht 66.0 in | Wt 146.0 lb

## 2013-08-24 DIAGNOSIS — D0590 Unspecified type of carcinoma in situ of unspecified breast: Secondary | ICD-10-CM

## 2013-08-24 DIAGNOSIS — R928 Other abnormal and inconclusive findings on diagnostic imaging of breast: Secondary | ICD-10-CM

## 2013-08-24 NOTE — Patient Instructions (Signed)
You are recovering from your right lumpectomy without any obvious surgical complications.  We have reviewed your pathology report which shows no cancer, radial scar, hyperplasia, fibrocystic disease, and calcifications.  Nothing further needs to be done at this point.  Be sure that you get annual mammography and annual position exam.  See Dr. Dalbert Batman if there are any problems.

## 2013-08-24 NOTE — Progress Notes (Signed)
Patient ID: Ashley White, female   DOB: 05-15-1950, 63 y.o.   MRN: [id number] History: This patient returns for a postop check. She underwent right breast lumpectomy with radioactive seed localization on 08/09/2013. She has a history of left breast cancer treated [place] in 2001.     Fortunately, her recent right lumpectomy is benign, showing radial score, usual ductal hyperplasia, calcifications. She feels good. She thinks she is healing well. She is here today with her husband.  Exam: Patient looks well. No distress. Right breast is healing well. The radial incision at 6:00 shows no hematoma or infection. Contour is actually very good. Minimal volume loss. Slight lift. 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 Diagnosis 2001.[place], [place].   No evidence of recurrence 14 years following lumpectomy, sentinel node biopsy, radiation therapy, chemotherapy, and tamoxifen.  3)   Mitral valve prolapse, asymptomatic  4)   History cataract surgery  5)   History right tibial plateau fracture  6)   History left salpingo-oophorectomy for dermoid tumor   Plan: We spent a long time discussing her history of breast cancer and her current histologic findings. I told her that her biggest risk was her history of left breast cancer. We talked about survival issues. We talked about wound healing and cosmetics.  My current recommendations are annual mammography and annual physician breast exam with her primary care physician. Return to see me as needed.   Edsel Petrin. Dalbert Batman, M.D., [facility] General and Minimally invasive Surgery Breast and Colorectal Surgery Office:   [phone] Pager:   [phone]

## 2013-08-26 ENCOUNTER — Encounter (INDEPENDENT_AMBULATORY_CARE_PROVIDER_SITE_OTHER): Payer: Self-pay

## 2013-11-23 ENCOUNTER — Encounter: Payer: Self-pay | Admitting: Cardiology

## 2013-11-24 ENCOUNTER — Other Ambulatory Visit: Payer: Self-pay | Admitting: Cardiology

## 2013-11-24 ENCOUNTER — Ambulatory Visit
Admission: RE | Admit: 2013-11-24 | Discharge: 2013-11-24 | Disposition: A | Discharge status: Home / Self Care | Payer: No Typology Code available for payment source | Source: Ambulatory Visit | Attending: Cardiology | Admitting: Cardiology

## 2013-11-24 DIAGNOSIS — I4891 Unspecified atrial fibrillation: Secondary | ICD-10-CM

## 2013-11-25 ENCOUNTER — Encounter: Payer: Self-pay | Admitting: Internal Medicine

## 2013-12-24 ENCOUNTER — Encounter (HOSPITAL_COMMUNITY): Payer: Self-pay | Admitting: Emergency Medicine

## 2013-12-24 ENCOUNTER — Inpatient Hospital Stay (HOSPITAL_COMMUNITY)
Admission: EM | Admit: 2013-12-24 | Discharge: 2013-12-27 | DRG: 062 | Disposition: A | Discharge status: Home / Self Care | Payer: No Typology Code available for payment source | Attending: Neurology | Admitting: Neurology

## 2013-12-24 ENCOUNTER — Emergency Department (HOSPITAL_COMMUNITY): Payer: No Typology Code available for payment source

## 2013-12-24 DIAGNOSIS — E876 Hypokalemia: Secondary | ICD-10-CM | POA: Diagnosis present

## 2013-12-24 DIAGNOSIS — R4701 Aphasia: Secondary | ICD-10-CM | POA: Diagnosis present

## 2013-12-24 DIAGNOSIS — F1010 Alcohol abuse, uncomplicated: Secondary | ICD-10-CM | POA: Diagnosis present

## 2013-12-24 DIAGNOSIS — I4891 Unspecified atrial fibrillation: Secondary | ICD-10-CM | POA: Diagnosis present

## 2013-12-24 DIAGNOSIS — F3289 Other specified depressive episodes: Secondary | ICD-10-CM | POA: Diagnosis present

## 2013-12-24 DIAGNOSIS — Z853 Personal history of malignant neoplasm of breast: Secondary | ICD-10-CM

## 2013-12-24 DIAGNOSIS — I480 Paroxysmal atrial fibrillation: Secondary | ICD-10-CM

## 2013-12-24 DIAGNOSIS — I6350 Cerebral infarction due to unspecified occlusion or stenosis of unspecified cerebral artery: Secondary | ICD-10-CM

## 2013-12-24 DIAGNOSIS — I6340 Cerebral infarction due to embolism of unspecified cerebral artery: Principal | ICD-10-CM | POA: Diagnosis present

## 2013-12-24 DIAGNOSIS — Z9989 Dependence on other enabling machines and devices: Secondary | ICD-10-CM

## 2013-12-24 DIAGNOSIS — G4733 Obstructive sleep apnea (adult) (pediatric): Secondary | ICD-10-CM | POA: Diagnosis present

## 2013-12-24 DIAGNOSIS — E785 Hyperlipidemia, unspecified: Secondary | ICD-10-CM

## 2013-12-24 DIAGNOSIS — G8190 Hemiplegia, unspecified affecting unspecified side: Secondary | ICD-10-CM | POA: Diagnosis present

## 2013-12-24 DIAGNOSIS — I639 Cerebral infarction, unspecified: Secondary | ICD-10-CM

## 2013-12-24 DIAGNOSIS — F329 Major depressive disorder, single episode, unspecified: Secondary | ICD-10-CM | POA: Diagnosis present

## 2013-12-24 LAB — COMPREHENSIVE METABOLIC PANEL
ALT: 23 U/L (ref 0–35)
ANION GAP: 13 (ref 5–15)
AST: 26 U/L (ref 0–37)
Albumin: 3.4 g/dL — ABNORMAL LOW (ref 3.5–5.2)
Alkaline Phosphatase: 77 U/L (ref 39–117)
BUN: 18 mg/dL (ref 6–23)
CALCIUM: 9.1 mg/dL (ref 8.4–10.5)
CO2: 25 mEq/L (ref 19–32)
Chloride: 103 mEq/L (ref 96–112)
Creatinine, Ser: 0.57 mg/dL (ref 0.50–1.10)
GFR calc Af Amer: >90 mL/min (ref >90)
Glucose, Bld: 106 mg/dL — ABNORMAL HIGH (ref 70–99)
Potassium: 3.9 mEq/L (ref 3.7–5.3)
Sodium: 141 mEq/L (ref 137–147)
Total Bilirubin: 0.2 mg/dL — ABNORMAL LOW (ref 0.3–1.2)
Total Protein: 6.2 g/dL (ref 6.0–8.3)

## 2013-12-24 LAB — URINALYSIS, ROUTINE W REFLEX MICROSCOPIC
BILIRUBIN URINE: NEGATIVE
GLUCOSE, UA: NEGATIVE mg/dL
HGB URINE DIPSTICK: NEGATIVE
Ketones, ur: NEGATIVE mg/dL
Leukocytes, UA: NEGATIVE
Nitrite: NEGATIVE
PH: 6.5 (ref 5.0–8.0)
Protein, ur: NEGATIVE mg/dL
SPECIFIC GRAVITY, URINE: 1.005 (ref 1.005–1.030)
UROBILINOGEN UA: 0.2 mg/dL (ref 0.0–1.0)

## 2013-12-24 LAB — CBC
HEMATOCRIT: 39 % (ref 36.0–46.0)
Hemoglobin: 13.6 g/dL (ref 12.0–15.0)
MCH: 30.6 pg (ref 26.0–34.0)
MCHC: 34.9 g/dL (ref 30.0–36.0)
MCV: 87.8 fL (ref 78.0–100.0)
Platelets: 195 10*3/uL (ref 150–400)
RBC: 4.44 MIL/uL (ref 3.87–5.11)
RDW: 12.4 % (ref 11.5–15.5)
WBC: 6.3 10*3/uL (ref 4.0–10.5)

## 2013-12-24 LAB — I-STAT CHEM 8, ED
BUN: 18 mg/dL (ref 6–23)
Calcium, Ion: 1.15 mmol/L (ref 1.13–1.30)
Chloride: 103 mEq/L (ref 96–112)
Creatinine, Ser: 0.6 mg/dL (ref 0.50–1.10)
Glucose, Bld: 107 mg/dL — ABNORMAL HIGH (ref 70–99)
HEMATOCRIT: 43 % (ref 36.0–46.0)
HEMOGLOBIN: 14.6 g/dL (ref 12.0–15.0)
Potassium: 3.6 mEq/L — ABNORMAL LOW (ref 3.7–5.3)
SODIUM: 142 meq/L (ref 137–147)
TCO2: 24 mmol/L (ref 0–100)

## 2013-12-24 LAB — DIFFERENTIAL
BASOS ABS: 0 10*3/uL (ref 0.0–0.1)
BASOS PCT: 1 % (ref 0–1)
Eosinophils Absolute: 0.1 10*3/uL (ref 0.0–0.7)
Eosinophils Relative: 2 % (ref 0–5)
Lymphocytes Relative: 36 % (ref 12–46)
Lymphs Abs: 2.3 10*3/uL (ref 0.7–4.0)
MONO ABS: 0.4 10*3/uL (ref 0.1–1.0)
MONOS PCT: 7 % (ref 3–12)
Neutro Abs: 3.5 10*3/uL (ref 1.7–7.7)
Neutrophils Relative %: 56 % (ref 43–77)

## 2013-12-24 LAB — RAPID URINE DRUG SCREEN, HOSP PERFORMED
Amphetamines: NOT DETECTED
Barbiturates: NOT DETECTED
Benzodiazepines: NOT DETECTED
COCAINE: NOT DETECTED
Opiates: NOT DETECTED
TETRAHYDROCANNABINOL: NOT DETECTED

## 2013-12-24 LAB — MRSA PCR SCREENING: MRSA by PCR: NEGATIVE

## 2013-12-24 LAB — PROTIME-INR
INR: 1.11 (ref 0.00–1.49)
Prothrombin Time: 14.3 seconds (ref 11.6–15.2)

## 2013-12-24 LAB — ETHANOL: Alcohol, Ethyl (B): <10 mg/dL (ref 0–11)

## 2013-12-24 LAB — I-STAT TROPONIN, ED: TROPONIN I, POC: 0 ng/mL (ref 0.00–0.08)

## 2013-12-24 LAB — APTT: aPTT: 28 seconds (ref 24–37)

## 2013-12-24 MED ORDER — SODIUM CHLORIDE 0.9 % IV SOLN
INTRAVENOUS | Status: DC
  Administered 2013-12-24 – 2013-12-26 (×3): via INTRAVENOUS

## 2013-12-24 MED ORDER — ACETAMINOPHEN 325 MG PO TABS
650.0000 mg | ORAL_TABLET | ORAL | Status: DC | PRN
  Filled 2013-12-24: qty 2

## 2013-12-24 MED ORDER — STROKE: EARLY STAGES OF RECOVERY BOOK
Freq: Once | Status: AC
  Administered 2013-12-24: 21:00:00
  Filled 2013-12-24: qty 1

## 2013-12-24 MED ORDER — INFLUENZA VAC SPLIT QUAD 0.5 ML IM SUSY
0.5000 mL | PREFILLED_SYRINGE | INTRAMUSCULAR | Status: AC
  Administered 2013-12-25: 0.5 mL via INTRAMUSCULAR
  Filled 2013-12-24: qty 0.5

## 2013-12-24 MED ORDER — PANTOPRAZOLE SODIUM 40 MG IV SOLR
40.0000 mg | Freq: Every day | INTRAVENOUS | Status: DC
  Administered 2013-12-24 – 2013-12-26 (×3): 40 mg via INTRAVENOUS
  Filled 2013-12-24 (×3): qty 40

## 2013-12-24 MED ORDER — LABETALOL HCL 5 MG/ML IV SOLN: 10.0000 mg | INTRAVENOUS | Status: DC | PRN

## 2013-12-24 MED ORDER — ACETAMINOPHEN 650 MG RE SUPP: 650.0000 mg | RECTAL | Status: DC | PRN

## 2013-12-24 MED ORDER — SENNOSIDES-DOCUSATE SODIUM 8.6-50 MG PO TABS
1.0000 | ORAL_TABLET | Freq: Every evening | ORAL | Status: DC | PRN
Start: 2013-12-24 — End: 2013-12-27
  Filled 2013-12-24: qty 1

## 2013-12-24 MED ORDER — ALTEPLASE (STROKE) FULL DOSE INFUSION
0.9000 mg/kg | Freq: Once | INTRAVENOUS | Status: AC
Start: 2013-12-24 — End: 2013-12-24
  Administered 2013-12-24: 63 mg via INTRAVENOUS
  Filled 2013-12-24: qty 63

## 2013-12-24 NOTE — Consult Note (Signed)
Referring Physician: ED    Chief Complaint: code stroke, aphasia, right hemiparesis, confusion  HPI:                                                                                                                                         Ashley White is an 63 y.o. female with a past medical history significant for newly diagnosed atrial fibrillation no taking anticoagulants, OSA on CPAP, left breast cancer status post lumpectomy, brought in by EMS as a code stroke due to acute onset of the above stated symptoms. Husband is at the bedside and stated that they were home after having dinner when she became less responsive and was not talking properly. Husband called EMS immediately and she was found to have right hemiparesis, aphasia, and confusion. Brought to the ED where she had NIHSS 3 and CT brain showed no acute intracranial abnormality. EKG showed sinus rhythm.   Date last known well: 12/24/13 Time last known well: 7:40pm tPA Given: yes NIHSS: 3   Past Medical History  Diagnosis Date  . Cancer 2001    left breast  . Mitral valve prolapse   . Heart murmur     mitral valve  . Sleep apnea     uses CPAP  . Depression   . ADD (attention deficit disorder)     Past Surgical History  Procedure Laterality Date  . Oophorectomy Left   . Right knee tibia surgery      2006  . Breast surgery      left cyst removed  . Breast lumpectomy with radioactive seed localization Right 08/09/2013    Procedure: BREAST LUMPECTOMY WITH RADIOACTIVE SEED LOCALIZATION;  Surgeon: Adin Hector, MD;  Location: [facility];  Service: General;  Laterality: Right;  . Cataract extraction      Family History  Problem Relation Age of Onset  . Heart disease Mother   . Cancer Father     colon  . Cancer Paternal Grandmother     breast   Social History:  reports that she has never smoked. She has never used smokeless tobacco. She reports that she drinks alcohol. She reports that she uses  illicit drugs.  Allergies:  Allergies  Allergen Reactions  . Penicillins     Medications:  I have reviewed the patient's current medications.  ROS:                                                                                                                                       History obtained from chart review ,EMS, and husband  General ROS: negative for - chills, fatigue, fever, night sweats, weight gain or weight loss Psychological ROS: negative for - behavioral disorder, hallucinations, memory difficulties, mood swings or suicidal ideation Ophthalmic ROS: negative for - blurry vision, double vision, eye pain or loss of vision ENT ROS: negative for - epistaxis, nasal discharge, oral lesions, sore throat, tinnitus or vertigo Allergy and Immunology ROS: negative for - hives or itchy/watery eyes Hematological and Lymphatic ROS: negative for - bleeding problems, bruising or swollen lymph nodes Endocrine ROS: negative for - galactorrhea, hair pattern changes, polydipsia/polyuria or temperature intolerance Respiratory ROS: negative for - cough, hemoptysis, shortness of breath or wheezing Cardiovascular ROS: negative for - chest pain, dyspnea on exertion, edema or irregular heartbeat Gastrointestinal ROS: negative for - abdominal pain, diarrhea, hematemesis, nausea/vomiting or stool incontinence Genito-Urinary ROS: negative for - dysuria, hematuria, incontinence or urinary frequency/urgency Musculoskeletal ROS: negative for - joint swelling or muscular weakness Neurological ROS: as noted in HPI Dermatological ROS: negative for rash and skin lesion changes  Physical exam: pleasant female in no apparent distress. Weight 70.2 kg (154 lb 12.2 oz). BP 130/70 P 82, afebrile. Head: normocephalic. Neck: supple, no bruits, no JVD. Cardiac: no murmurs. Lungs:  clear. Abdomen: soft, no tender, no mass. Extremities: no edema. Neurologic Examination:                                                                                                      General: Mental Status: Alert and awake, oriented, thought content appropriate. Global aphasia.  Able to follow simple step commands without difficulty. Cranial Nerves: II: Discs flat bilaterally; Visual fields grossly normal, pupils equal, round, reactive to light and accommodation III,IV, VI: ptosis not present, extra-ocular motions intact bilaterally V,VII: smile symmetric, facial light touch sensation normal bilaterally VIII: hearing normal bilaterally IX,X: gag reflex present XI: bilateral shoulder shrug XII: midline tongue extension without atrophy or fasciculations Motor: Right : Upper extremity   5/5    Left:     Upper extremity   5/5  Lower extremity   5/5     Lower extremity   5/5 Tone and bulk:normal tone throughout; no atrophy noted Sensory: Pinprick and light touch intact unreliable  Deep Tendon Reflexes:  Right: Upper Extremity   Left: Upper extremity   biceps (C-5 to C-6) 2/4   biceps (C-5 to C-6) 2/4 tricep (C7) 2/4    triceps (C7) 2/4 Brachioradialis (C6) 2/4  Brachioradialis (C6) 2/4  Lower Extremity Lower Extremity  quadriceps (L-2 to L-4) 2/4   quadriceps (L-2 to L-4) 2/4 Achilles (S1) 2/4   Achilles (S1) 2/4  Plantars: Right: downgoing   Left: downgoing Cerebellar: normal finger-to-nose,  normal heel-to-shin test Gait:  No tested. CV: pulses palpable throughout    Results for orders placed during the hospital encounter of 12/24/13 (from the past 48 hour(s))  CBC     Status: None   Collection Time    12/24/13  8:09 PM      Result Value Ref Range   WBC 6.3  4.0 - 10.5 K/uL   RBC 4.44  3.87 - 5.11 MIL/uL   Hemoglobin 13.6  12.0 - 15.0 g/dL   HCT 39.0  36.0 - 46.0 %   MCV 87.8  78.0 - 100.0 fL   MCH 30.6  26.0 - 34.0 pg   MCHC 34.9  30.0 - 36.0 g/dL   RDW 12.4   11.5 - 15.5 %   Platelets 195  150 - 400 K/uL  DIFFERENTIAL     Status: None   Collection Time    12/24/13  8:09 PM      Result Value Ref Range   Neutrophils Relative % 56  43 - 77 %   Neutro Abs 3.5  1.7 - 7.7 K/uL   Lymphocytes Relative 36  12 - 46 %   Lymphs Abs 2.3  0.7 - 4.0 K/uL   Monocytes Relative 7  3 - 12 %   Monocytes Absolute 0.4  0.1 - 1.0 K/uL   Eosinophils Relative 2  0 - 5 %   Eosinophils Absolute 0.1  0.0 - 0.7 K/uL   Basophils Relative 1  0 - 1 %   Basophils Absolute 0.0  0.0 - 0.1 K/uL  I-STAT TROPOININ, ED     Status: None   Collection Time    12/24/13  8:16 PM      Result Value Ref Range   Troponin i, poc 0.00  0.00 - 0.08 ng/mL   Comment 3            Comment: Due to the release kinetics of cTnI,     a negative result within the first hours     of the onset of symptoms does not rule out     myocardial infarction with certainty.     If myocardial infarction is still suspected,     repeat the test at appropriate intervals.  I-STAT CHEM 8, ED     Status: Abnormal   Collection Time    12/24/13  8:17 PM      Result Value Ref Range   Sodium 142  137 - 147 mEq/L   Potassium 3.6 (*) 3.7 - 5.3 mEq/L   Chloride 103  96 - 112 mEq/L   BUN 18  6 - 23 mg/dL   Creatinine, Ser 0.60  0.50 - 1.10 mg/dL   Glucose, Bld 107 (*) 70 - 99 mg/dL   Calcium, Ion 1.15  1.13 - 1.30 mmol/L   TCO2 24  0 - 100 mmol/L   Hemoglobin 14.6  12.0 - 15.0 g/dL   HCT 43.0  36.0 - 46.0 %   Ct Head Wo Contrast  12/24/2013   CLINICAL DATA:  Right-sided weakness  EXAM: CT HEAD WITHOUT CONTRAST  TECHNIQUE: Contiguous axial images were obtained from the base of the skull through the vertex without intravenous contrast.  COMPARISON:  None.  FINDINGS: There is no evidence of mass effect, midline shift or extra-axial fluid collections. There is no evidence of a space-occupying lesion or intracranial hemorrhage. There is no evidence of a cortical-based area of acute infarction. Mild periventricular  white matter low attenuation likely secondary to microangiopathy.  The ventricles and sulci are appropriate for the patient's age. The basal cisterns are patent.  Visualized portions of the orbits are unremarkable. The visualized portions of the paranasal sinuses and mastoid air cells are unremarkable.  The osseous structures are unremarkable.  IMPRESSION: No acute intracranial pathology. These results were called by telephone at the time of interpretation on 12/24/2013 at 8:26 pm to Dr. Aram Beecham, who verbally acknowledged these results.   Electronically Signed   By: Kathreen Devoid   On: 12/24/2013 20:26    Assessment: 63 y.o. female with acute onset global aphasia, right hemiparesis (resolved), and confusion. NIHSS 3. CT brain unremarkable for acute abnormality. 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 Although NIHSS 3, will pursue IV thrombolysis due to the potential disabling residual aphasia from this stroke. Admit to NICU and complete stroke work up. Follow post IV tpa protocol. Stroke team will resume care tomorrow.   Stroke Risk Factors - atrial fibrillation  Plan: 1. HgbA1c, fasting lipid panel 2. MRI, MRA  of the brain without contrast 3. Echocardiogram 4. Carotid dopplers 5. Prophylactic therapy-aspirin as per post IV tpa protocol. 6. Risk factor modification 7. Telemetry monitoring 8. Frequent neuro checks 9. PT/OT SLP   Dorian Pod ,MD [facility] [phone]  12/24/2013, 8:38 PM

## 2013-12-24 NOTE — Progress Notes (Signed)
RN attempted cpap with pt. But pt. Did not tolerate. Pt. Stated she will just wait til her husband brings her cpap from home. Pt. States she prefers to use her own mask as well. RT informed pt. To notify if she changed her mind and wanted try again.

## 2013-12-24 NOTE — Code Documentation (Signed)
63 yo wf brought to [facility] by [facility] for sudden onset garbled speech and Rt side weakness.  Per husband the pt became mute then developed jumbled speech & Rt side weakness.  By the time of arrival to [facility] the weakness had resolved but the pt still had significant word salad.  NIH 3 for aphasia though no dysarthria.  See doc flowsheets for code stroke times.  Decision made to give tPA by Dr. Armida Sans due to life altering deficits.  Plan to tx to 3M09

## 2013-12-24 NOTE — ED Notes (Signed)
Pt here from home with husband with sudden onset right sided weakness and aphasia. Pt husband at bedside. Pt able to follow commands, but is having trouble with finding her words. Pt is oriented to person.

## 2013-12-25 ENCOUNTER — Inpatient Hospital Stay (HOSPITAL_COMMUNITY): Payer: No Typology Code available for payment source

## 2013-12-25 DIAGNOSIS — I059 Rheumatic mitral valve disease, unspecified: Secondary | ICD-10-CM

## 2013-12-25 DIAGNOSIS — I6340 Cerebral infarction due to embolism of unspecified cerebral artery: Principal | ICD-10-CM

## 2013-12-25 DIAGNOSIS — E785 Hyperlipidemia, unspecified: Secondary | ICD-10-CM

## 2013-12-25 LAB — LIPID PANEL
Cholesterol: 186 mg/dL (ref 0–200)
HDL: 51 mg/dL (ref >39)
LDL Cholesterol: 120 mg/dL — ABNORMAL HIGH (ref 0–99)
Total CHOL/HDL Ratio: 3.6 RATIO
Triglycerides: 77 mg/dL (ref <150)
VLDL: 15 mg/dL (ref 0–40)

## 2013-12-25 LAB — HEMOGLOBIN A1C
HEMOGLOBIN A1C: 6.1 % — AB (ref <5.7)
MEAN PLASMA GLUCOSE: 128 mg/dL — AB (ref <117)

## 2013-12-25 MED ORDER — ASPIRIN 325 MG PO TABS
ORAL_TABLET | ORAL | Status: AC
  Administered 2013-12-25: 22:00:00
  Filled 2013-12-25: qty 1

## 2013-12-25 MED ORDER — ASPIRIN 325 MG PO TABS
325.0000 mg | ORAL_TABLET | Freq: Every day | ORAL | Status: DC
  Administered 2013-12-25 – 2013-12-26 (×2): 325 mg via ORAL
  Filled 2013-12-25: qty 1

## 2013-12-25 MED ORDER — ATORVASTATIN CALCIUM 10 MG PO TABS
10.0000 mg | ORAL_TABLET | Freq: Every day | ORAL | Status: DC
  Administered 2013-12-25: 10 mg via ORAL
  Filled 2013-12-25 (×2): qty 1

## 2013-12-25 NOTE — Progress Notes (Signed)
STROKE TEAM PROGRESS NOTE   HISTORY  Ashley White is a 63 y.o. female with a past medical history significant for newly diagnosed atrial fibrillation no taking anticoagulants, OSA on CPAP, left breast cancer status post lumpectomy, brought in by EMS as a code stroke due to acute onset of code stroke, aphasia, right hemiparesis, confusion Husband is at the bedside and stated that they were home after having dinner when she became less responsive and was not talking properly. Husband called EMS immediately and she was found to have right hemiparesis, aphasia, and confusion.  Brought to the ED where she had NIHSS 3 and CT brain showed no acute intracranial abnormality.  EKG showed sinus rhythm.   Date last known well: 12/24/13  Time last known well: 7:40pm  tPA Given: yes  NIHSS: 3    SUBJECTIVE (INTERVAL HISTORY) She reports moderate improvement in Speech-language. She still has some difficulties with word finding. She is sitting up in breakfast.  OBJECTIVE Temp:  [97.4 F (36.3 C)-98.2 F (36.8 C)] 97.4 F (36.3 C) (09/26 0815) Pulse Rate:  [52-81] 69 (09/26 0730) Cardiac Rhythm:  [-]  Resp:  [13-25] 16 (09/26 0730) BP: (83-140)/(40-74) 113/54 mmHg (09/26 0730) SpO2:  [94 %-100 %] 97 % (09/26 0730) Weight:  [154 lb 5.2 oz (70 kg)-154 lb 12.2 oz (70.2 kg)] 154 lb 5.2 oz (70 kg) (09/25 2115)  No results found for this basename: GLUCAP,  in the last 168 hours  Recent Labs Lab 12/24/13 2009 12/24/13 2017  NA 141 142  K 3.9 3.6*  CL 103 103  CO2 25  --   GLUCOSE 106* 107*  BUN 18 18  CREATININE 0.57 0.60  CALCIUM 9.1  --     Recent Labs Lab 12/24/13 2009  AST 26  ALT 23  ALKPHOS 77  BILITOT 0.2*  PROT 6.2  ALBUMIN 3.4*    Recent Labs Lab 12/24/13 2009 12/24/13 2017  WBC 6.3  --   NEUTROABS 3.5  --   HGB 13.6 14.6  HCT 39.0 43.0  MCV 87.8  --   PLT 195  --    No results found for this basename: CKTOTAL, CKMB, CKMBINDEX, TROPONINI,  in the last 168  hours  Recent Labs  12/24/13 2009  LABPROT 14.3  INR 1.11    Recent Labs  12/24/13 2040  COLORURINE YELLOW  LABSPEC 1.005  PHURINE 6.5  GLUCOSEU NEGATIVE  HGBUR NEGATIVE  BILIRUBINUR NEGATIVE  KETONESUR NEGATIVE  PROTEINUR NEGATIVE  UROBILINOGEN 0.2  NITRITE NEGATIVE  LEUKOCYTESUR NEGATIVE       Component Value Date/Time   CHOL 186 12/25/2013 0251   TRIG 77 12/25/2013 0251   HDL 51 12/25/2013 0251   CHOLHDL 3.6 12/25/2013 0251   VLDL 15 12/25/2013 0251   LDLCALC 120* 12/25/2013 0251   No results found for this basename: HGBA1C      Component Value Date/Time   LABOPIA NONE DETECTED 12/24/2013 2040   COCAINSCRNUR NONE DETECTED 12/24/2013 2040   LABBENZ NONE DETECTED 12/24/2013 2040   AMPHETMU NONE DETECTED 12/24/2013 2040   THCU NONE DETECTED 12/24/2013 2040   LABBARB NONE DETECTED 12/24/2013 2040     Recent Labs Lab 12/24/13 2009  ETH <10    Ct Head Wo Contrast 12/24/2013    No acute intracranial pathology.     PHYSICAL EXAM GENERAL: She is in no acute distress.  HEENT: Normocephalic and atraumatic.  ABDOMEN: soft  EXTREMITIES: No edema   BACK:Normal.  SKIN: Normal by inspection.  MENTAL STATUS: She is awake and alert. She has a mild to moderate word finding difficulties. She does comprehend well. Speech is normal. She is able to name 5 out of 5 objects with some difficulties however. There is also mild to moderate difficulty with repetition.   CRANIAL NERVES: Pupils are OD 4.5MM OS 4MM , round and reactive to light; extra ocular movements are full, there is no significant nystagmus; visual fields are full; upper and lower facial muscles are normal in strength and symmetric, there is no flattening of the nasolabial folds; tongue is midline; uvula is midline; shoulder elevation is normal.  MOTOR: Normal tone, bulk and strength; no pronator drift.  COORDINATION: Left finger to nose is normal, right finger to nose is normal, No rest tremor; no intention  tremor; no postural tremor; no bradykinesia.     ASSESSMENT/PLAN  Ms. Ashley White is a 63 y.o. female with history of newly diagnosed atrial fibrillation and obstructive sleep apnea  presenting as a code stroke, with aphasia, right hemiparesis, and confusion. She will need anticoagulation long-term but this can wait for a week or so. . She did receive 63 mg IV t-PA  at 2045 due to aphasia. Initial CT scan showed no acute intracranial pathology. A followup CT as well as an MRI/MRA is pending.  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  MRI  pending  MRA  pending  Carotid Doppler  pending  2D Echo  pending                                                                        aspirin 81 mg orally every day prior to admission, now on No anti coagulation or antithrombotics secondary to TPA therapy  LDL 120 - add statin   HgbA1c pending  SCDs for VTE prophylaxis  General with thin liquids.   Bedrest  Resultant Mild to moderate aphasia.  Therapy recommendations:  Pending  Ongoing aggressive risk factor management  Risk factor education  Disposition:  Pending   Hyperlipidemia  Home meds:  No statin prior to admission.  LDL 120, goal < 100 (<70 for diabetics) - no statin prior to admission - Lipitor started  Continue statin at discharge   Other Stroke Risk Factors Advanced age ETOH use Obstructive sleep apnea, on CPAP at home Atrial fibrillation  Other Active Problems  Mild hypokalemia  Other Pertinent History    Hospital day # 1  Lowry Ram [facility] Pager [phone] 12/25/2013, 8:38 AM     To contact Stroke Continuity provider, please refer to [email or web address] After hours, contact General Neurology

## 2013-12-25 NOTE — Progress Notes (Signed)
  Echocardiogram 2D Echocardiogram has been performed.  Darlina Sicilian M 12/25/2013, 11:17 AM

## 2013-12-25 NOTE — Progress Notes (Signed)
PT Cancellation Note  Patient Details Name: Ashley White MRN: [id number] DOB: June 28, 1950   Cancelled Treatment:    Reason Eval/Treat Not Completed: Medical issues which prohibited therapy;Patient not medically ready. Pt on bedrest till 9pm secondary to TPA administration.    Elie Confer Phil Campbell, [place] 12/25/2013, 2:15 PM

## 2013-12-26 ENCOUNTER — Inpatient Hospital Stay (HOSPITAL_COMMUNITY): Payer: No Typology Code available for payment source

## 2013-12-26 MED ORDER — ASPIRIN EC 81 MG PO TBEC: 81.0000 mg | DELAYED_RELEASE_TABLET | Freq: Every day | ORAL | Status: DC

## 2013-12-26 MED ORDER — ASPIRIN EC 325 MG PO TBEC
325.0000 mg | DELAYED_RELEASE_TABLET | Freq: Every day | ORAL | Status: DC
  Administered 2013-12-27: 325 mg via ORAL
  Filled 2013-12-26: qty 1

## 2013-12-26 NOTE — Evaluation (Addendum)
Occupational Therapy Evaluation Patient Details Name: Ashley White MRN: [id number] DOB: [birth year] Today's Date: 12/26/2013    History of Present Illness 63 y.o. female with a past medical history significant for newly diagnosed atrial fibrillation no taking anticoagulants, OSA on CPAP, left breast cancer status post lumpectomy, brought in by EMS as a code stroke due to acute onset of code stroke, aphasia, right hemiparesis, confusion.  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     Clinical Impression   Pt s/p above. Education provided during session. OT to sign off.     Follow Up Recommendations  No OT follow up    Equipment Recommendations  None recommended by OT    Recommendations for Other Services       Precautions / Restrictions Precautions Precautions: None Restrictions Weight Bearing Restrictions: No      Mobility Bed Mobility          General bed mobility comments: not assessed  Transfers Overall transfer level: Modified independent Equipment used: None                      ADL Overall ADL's : Modified independent    *educated on BE FAST stroke education and importance of getting help right away.                                         Vision    Pt wears glasses most of the time Pt reports no change from baseline Visual Fields: No apparent deficits   Tracking/Visual Pursuits: Able to track stimulus in all quads without difficulty (cues to keep head still) Saccades: Other (comment) (difficult)-educated on what pt could be doing to work on this; told pt to ask MD about driving * tried to have pt juggle and count by 2's and it was very difficult (pt/spouse report this was difficult PTA and pt states she has ADD)           Perception     Praxis      Pertinent Vitals/Pain Pain Assessment: No/denies pain     Hand Dominance     Extremity/Trunk  Assessment Upper Extremity Assessment Upper Extremity Assessment: Overall WFL for tasks assessed   Lower Extremity Assessment Lower Extremity Assessment: Defer to PT evaluation   Cervical / Trunk Assessment Cervical / Trunk Assessment: Normal   Communication Communication Communication: Expressive difficulties   Cognition Arousal/Alertness: Awake/alert Behavior During Therapy: WFL for tasks assessed/performed Overall Cognitive Status: History of cognitive impairments - at baseline (reports ADD)                     General Comments       Exercises       Shoulder Instructions      Home Living Family/patient expects to be discharged to:: Private residence Living Arrangements: Spouse/significant other Available Help at Discharge: Family;Available 24 hours/day Type of Home: House Home Access: Level entry     Home Layout: Two level;Bed/bath upstairs Alternate Level Stairs-Number of Steps: 9-10 Alternate Level Stairs-Rails: Left           Home Equipment: None      Lives With: Spouse    Prior Functioning/Environment Level of Independence: Independent        Comments: pt drives and is independent    OT Diagnosis:  OT Problem List:     OT Treatment/Interventions:         OT Frequency:     Barriers to D/C:            Co-evaluation              End of Session    Activity Tolerance: Patient tolerated treatment well Patient left: in bed;with family/visitor present   Time: 4765-4650 OT Time Calculation (min): 12 min Charges:  OT General Charges $OT Visit: 1 Procedure OT Evaluation $Initial OT Evaluation Tier I: 1 Procedure G-CodesBenito Mccreedy OTR/L [id number] 12/26/2013, 6:07 PM

## 2013-12-26 NOTE — Evaluation (Signed)
Speech Language Pathology Evaluation Patient Details Name: Ashley White MRN: [id number] DOB: [birth year] Today's Date: 12/26/2013 Time: 1194-1740 SLP Time Calculation (min): 20 min  Problem List:  Patient Active Problem List   Diagnosis Date Noted  . Stroke, acute, embolic 81/44/8185  . Abnormal mammogram 07/20/2013   Past Medical History:  Past Medical History  Diagnosis Date  . Cancer 2001    left breast  . Mitral valve prolapse   . Heart murmur     mitral valve  . Sleep apnea     uses CPAP  . Depression   . ADD (attention deficit disorder)    Past Surgical History:  Past Surgical History  Procedure Laterality Date  . Oophorectomy Left   . Right knee tibia surgery      2006  . Breast surgery      left cyst removed  . Breast lumpectomy with radioactive seed localization Right 08/09/2013    Procedure: BREAST LUMPECTOMY WITH RADIOACTIVE SEED LOCALIZATION;  Surgeon: Ashley Hector, MD;  Location: [facility];  Service: General;  Laterality: Right;  . Cataract extraction     HPI:  63 y.o. female with a past medical history significant for newly diagnosed atrial fibrillation no taking anticoagulants, OSA on CPAP, left breast cancer status post lumpectomy, brought in by EMS as a code stroke due to acute onset of code stroke, aphasia, right hemiparesis, confusion.  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    Assessment / Plan / Recommendation Clinical Impression  Pt presents with resolving aphasia, approaching baseline per spouse's and her own description.  Pt with baseline mild dysfluency, fast rate with frequent interjections.  She describes mild word-retrieval difficulties since her 48s.  Expressive and receptive language are [facility] per portions of the [facility] Diagnostic Aphasia Exam.  Pt with reading and writing performance [facility] as well.  No SLP f/u is warranted - pt/husband agree.  SLP will sign off.      SLP Assessment  Patient does not need any further Speech Lanaguage Pathology Services    Follow Up Recommendations  None    Frequency and Duration        Pertinent Vitals/Pain Pain Assessment: No/denies pain   SLP Goals     SLP Evaluation Prior Functioning  Cognitive/Linguistic Baseline: Within functional limits  Lives With: Spouse   Cognition  Overall Cognitive Status: Within Functional Limits for tasks assessed Orientation Level: Oriented X4    Comprehension  Auditory Comprehension Overall Auditory Comprehension: Appears within functional limits for tasks assessed Yes/No Questions: Within Functional Limits Commands: Within Functional Limits Conversation: Complex Visual Recognition/Discrimination Discrimination: Within Function Limits Reading Comprehension Reading Status: Within funtional limits    Expression Expression Primary Mode of Expression: Verbal Verbal Expression Overall Verbal Expression: Appears within functional limits for tasks assessed Level of Generative/Spontaneous Verbalization: Conversation Repetition: No impairment Naming: Impairment Responsive: 76-100% accurate (mild dysnomia at baseline) Confrontation: Within functional limits Convergent: 75-100% accurate Divergent: 75-100% accurate Pragmatics: No impairment Written Expression Written Expression: Within Functional Limits   Oral / Motor Oral Motor/Sensory Function Overall Oral Motor/Sensory Function: Appears within functional limits for tasks assessed Motor Speech Overall Motor Speech: Appears within functional limits for tasks assessed   Ashley White L. Tivis Ringer, [place] CCC/SLP Pager [phone]      Ashley White 12/26/2013, 11:40 AM

## 2013-12-26 NOTE — Progress Notes (Signed)
OT Cancellation Note  Patient Details Name: Ashley White MRN: [id number] DOB: 03-12-1951   Cancelled Treatment:    Reason Eval/Treat Not Completed: Patient at procedure or test/ unavailable  Benito Mccreedy OTR/L [phone] 12/26/2013, 10:15 AM

## 2013-12-26 NOTE — Progress Notes (Signed)
STROKE TEAM PROGRESS NOTE   HISTORY  Ashley White is a 63 y.o. female with a past medical history significant for newly diagnosed atrial fibrillation no taking anticoagulants, OSA on CPAP, left breast cancer status post lumpectomy, brought in by EMS as a code stroke due to acute onset of code stroke, aphasia, right hemiparesis, confusion Husband is at the bedside and stated that they were home after having dinner when she became less responsive and was not talking properly. Husband called EMS immediately and she was found to have right hemiparesis, aphasia, and confusion.  Brought to the ED where she had NIHSS 3 and CT brain showed no acute intracranial abnormality.  EKG showed sinus rhythm.   Date last known well: 12/24/13  Time last known well: 7:40pm  tPA Given: yes  NIHSS: 3    SUBJECTIVE (INTERVAL HISTORY) She reports marked improvement in Speech-language. She is at baseline. Apparently always had some word finding difficulty at baseline.   OBJECTIVE Temp:  [97.4 F (36.3 C)-98.2 F (36.8 C)] 97.4 F (36.3 C) (09/26 0815) Pulse Rate:  [52-81] 69 (09/26 0730) Cardiac Rhythm:  [-]  Resp:  [13-25] 16 (09/26 0730) BP: (83-140)/(40-74) 113/54 mmHg (09/26 0730) SpO2:  [94 %-100 %] 97 % (09/26 0730) Weight:  [154 lb 5.2 oz (70 kg)-154 lb 12.2 oz (70.2 kg)] 154 lb 5.2 oz (70 kg) (09/25 2115)  No results found for this basename: GLUCAP,  in the last 168 hours  Recent Labs Lab 12/24/13 2009 12/24/13 2017  NA 141 142  K 3.9 3.6*  CL 103 103  CO2 25  --   GLUCOSE 106* 107*  BUN 18 18  CREATININE 0.57 0.60  CALCIUM 9.1  --     Recent Labs Lab 12/24/13 2009  AST 26  ALT 23  ALKPHOS 77  BILITOT 0.2*  PROT 6.2  ALBUMIN 3.4*    Recent Labs Lab 12/24/13 2009 12/24/13 2017  WBC 6.3  --   NEUTROABS 3.5  --   HGB 13.6 14.6  HCT 39.0 43.0  MCV 87.8  --   PLT 195  --    No results found for this basename: CKTOTAL, CKMB, CKMBINDEX, TROPONINI,  in the last 168  hours  Recent Labs  12/24/13 2009  LABPROT 14.3  INR 1.11    Recent Labs  12/24/13 2040  COLORURINE YELLOW  LABSPEC 1.005  PHURINE 6.5  GLUCOSEU NEGATIVE  HGBUR NEGATIVE  BILIRUBINUR NEGATIVE  KETONESUR NEGATIVE  PROTEINUR NEGATIVE  UROBILINOGEN 0.2  NITRITE NEGATIVE  LEUKOCYTESUR NEGATIVE       Component Value Date/Time   CHOL 186 12/25/2013 0251   TRIG 77 12/25/2013 0251   HDL 51 12/25/2013 0251   CHOLHDL 3.6 12/25/2013 0251   VLDL 15 12/25/2013 0251   LDLCALC 120* 12/25/2013 0251   Lab Results  Component Value Date   HGBA1C 6.1* 12/25/2013      Component Value Date/Time   LABOPIA NONE DETECTED 12/24/2013 2040   COCAINSCRNUR NONE DETECTED 12/24/2013 2040   LABBENZ NONE DETECTED 12/24/2013 2040   AMPHETMU NONE DETECTED 12/24/2013 2040   THCU NONE DETECTED 12/24/2013 2040   LABBARB NONE DETECTED 12/24/2013 2040     Recent Labs Lab 12/24/13 2009  ETH <10    Ct Head Wo Contrast 12/24/2013    No acute intracranial pathology.   MRI / MRA HEAD 12/26/2013 1. Small, acute left MCA territory infarct in the region of the posterior left insula.  2. No major intracranial arterial occlusion or  significant proximal stenosis. Diminished flow versus occlusion of a left MCA insular  branch vessel.  3. Irregularity and mild narrowing of the distal right vertebral artery and basilar artery, although evaluation is limited by motion.      PHYSICAL EXAM GENERAL: She is in no acute distress.  HEENT: Normocephalic and atraumatic.  ABDOMEN: soft  EXTREMITIES: No edema   BACK:Normal.  SKIN: Normal by inspection.    MENTAL STATUS: She is awake and alert. She has a mild to moderate word finding difficulties. She does comprehend well. Speech is normal. She is able to name 5 out of 5 objects with mild difficulties however. There is also mild to moderate difficulty with repetition.   CRANIAL NERVES: Pupils are OD 4.5MM OS 4MM , round and reactive to light; extra ocular movements  are full, there is no significant nystagmus; visual fields are full; upper and lower facial muscles are normal in strength and symmetric, there is no flattening of the nasolabial folds; tongue is midline; uvula is midline; shoulder elevation is normal.  MOTOR: Normal tone, bulk and strength; no pronator drift.  COORDINATION: Left finger to nose is normal, right finger to nose is normal, No rest tremor; no intention tremor; no postural tremor; no bradykinesia.     ASSESSMENT/PLAN  Ms. Ashley White is a 63 y.o. female with history of newly diagnosed atrial fibrillation and obstructive sleep apnea  presenting as a code stroke, with aphasia, right hemiparesis, and confusion. She will need anticoagulation long-term but this can wait for a week or so..She did receive 63 mg IV t-PA  at 2045 due to aphasia. Initial CT scan showed no acute intracranial pathology. A followup CT as well as an MRI/MRA is pending.  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  MRI - Small, acute left MCA territory infarct in the region of the posterior left insula.   MRA - Irregularity and mild narrowing of the distal right vertebral artery and basilar artery, although evaluation is limited by motion.  Carotid Doppler  pending  2D Echo - ejection fraction 60-65%. No cardiac source of emboli identified.                                                                        aspirin 81 mg orally every day prior to admission, now on. Now on aspirin 325 mg daily.  LDL 120 - add statin   HgbA1c - 6.1  SCDs for VTE prophylaxis  General with thin liquids.   Bedrest  Resultant Mild to moderate aphasia.  Therapy recommendations:   Ongoing aggressive risk factor management  Risk factor education  Disposition:  Pending   Hyperlipidemia  Home meds:  No statin prior to admission.  LDL 120, goal < 100 (<70 for diabetics) - no statin prior to admission - Lipitor started  Continue statin at  discharge   Other Stroke Risk Factors Advanced age ETOH use Obstructive sleep apnea, on CPAP at home Atrial fibrillation - social worker consult to evaluate feasibility of Eliquis therapy.  Other Active Problems  Mild hypokalemia  S/P TPA therapy  Other Pertinent History    Hospital day # 2  Mikey Bussing [facility] Pager [phone] 12/26/2013, 8:13 AM  To contact Stroke Continuity provider, please refer to [email or web address] After hours, contact General Neurology

## 2013-12-26 NOTE — Evaluation (Signed)
Physical Therapy Evaluation Patient Details Name: Ashley White MRN: [id number] DOB: [birth year] Today's Date: 12/26/2013   History of Present Illness  63 y.o. female with a past medical history significant for newly diagnosed atrial fibrillation no taking anticoagulants, OSA on CPAP, left breast cancer status post lumpectomy, brought in by EMS as a code stroke due to acute onset of code stroke, aphasia, right hemiparesis, confusion.  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    Clinical Impression  Patient evaluated by Physical Therapy with no further acute PT needs identified. All education has been completed and the patient has no further questions. Pt demo good balance and scored 8 on DGI, indicating pt is at low risk for falls. Pt with some difficulties finding words, pt and husband report this is her baseline. See below for any follow-up Physial Therapy or equipment needs. PT is signing off. Thank you for this referral.     Follow Up Recommendations No PT follow up    Equipment Recommendations  None recommended by PT    Recommendations for Other Services       Precautions / Restrictions Precautions Precautions: None Restrictions Weight Bearing Restrictions: No      Mobility  Bed Mobility Overal bed mobility: Modified Independent                Transfers Overall transfer level: Modified independent Equipment used: None             General transfer comment: no LOB or sway noted  Ambulation/Gait Ambulation/Gait assistance: Supervision Ambulation Distance (Feet): 600 Feet (200', 400') Assistive device: None Gait Pattern/deviations: Step-through pattern;Decreased stride length;Scissoring (IR bil LEs; baseline for pt ) Gait velocity: decreased Gait velocity interpretation: Below normal speed for age/gender General Gait Details: per pt and husband pt at her baseline; pt with IR bil LEs; required  sitting break after 200' due to "light dizziness"; after subsided pt able to ambulate unit; see DGI  Stairs Stairs: Yes Stairs assistance: Supervision Stair Management: One rail Left;Alternating pattern;Forwards Number of Stairs: 5 General stair comments: no LOB noted  Wheelchair Mobility    Modified Rankin (Stroke Patients Only) Modified Rankin (Stroke Patients Only) Pre-Morbid Rankin Score: No symptoms Modified Rankin: Moderately severe disability     Balance Overall balance assessment:  (see DGI)                               Standardized Balance Assessment Standardized Balance Assessment : Dynamic Gait Index   Dynamic Gait Index Level Surface: Moderate Impairment Change in Gait Speed: Moderate Impairment Gait with Horizontal Head Turns: Moderate Impairment Gait with Vertical Head Turns: Moderate Impairment Gait and Pivot Turn: Moderate Impairment Step Over Obstacle: Moderate Impairment Step Around Obstacles: Moderate Impairment Steps: Moderate Impairment Total Score: 8       Pertinent Vitals/Pain Pain Assessment: No/denies pain    Home Living Family/patient expects to be discharged to:: Private residence Living Arrangements: Spouse/significant other Available Help at Discharge: Family;Available 24 hours/day Type of Home: House Home Access: Level entry     Home Layout: Two level;Bed/bath upstairs Home Equipment: None      Prior Function Level of Independence: Independent         Comments: pt drives and is independent     Hand Dominance        Extremity/Trunk Assessment   Upper Extremity Assessment: Defer to OT  evaluation           Lower Extremity Assessment: Overall WFL for tasks assessed      Cervical / Trunk Assessment: Normal  Communication   Communication: No difficulties  Cognition Arousal/Alertness: Awake/alert Behavior During Therapy: WFL for tasks assessed/performed Overall Cognitive Status: Within Functional  Limits for tasks assessed                      General Comments General comments (skin integrity, edema, etc.): educated pt and husband on s/s of stroke "BE FAST"    Exercises        Assessment/Plan    PT Assessment Patent does not need any further PT services  PT Diagnosis     PT Problem List    PT Treatment Interventions     PT Goals (Current goals can be found in the Care Plan section) Acute Rehab PT Goals Patient Stated Goal: home soon PT Goal Formulation: No goals set, d/c therapy    Frequency     Barriers to discharge        Co-evaluation               End of Session Equipment Utilized During Treatment: Gait belt Activity Tolerance: Patient tolerated treatment well Patient left: in chair;with call bell/phone within reach;with family/visitor present Nurse Communication: Mobility status         Time: 9357-0177 PT Time Calculation (min): 15 min   Charges:   PT Evaluation $Initial PT Evaluation Tier I: 1 Procedure PT Treatments $Gait Training: 8-22 mins   PT G CodesGustavus Bryant, [place]  [phone] 12/26/2013, 3:06 PM

## 2013-12-26 NOTE — Progress Notes (Signed)
VASCULAR LAB PRELIMINARY  PRELIMINARY  PRELIMINARY  PRELIMINARY  Carotid Dopplers completed.    Preliminary report:  1-39% ICA stenosis.  Vertebral artery flow is antegrade.   Mearl Olver, RVT 12/26/2013, 5:27 PM

## 2013-12-26 NOTE — Progress Notes (Signed)
Patient  Transferred from [facility] via bed. Patient alert and oriented x4 oriented to unit and room  Was informed at hand off from nurse that patient had a brief episode of nose bleed before transfer. No active bleeding noted. Will continue to monitor.

## 2013-12-27 DIAGNOSIS — I4891 Unspecified atrial fibrillation: Secondary | ICD-10-CM | POA: Diagnosis present

## 2013-12-27 DIAGNOSIS — E785 Hyperlipidemia, unspecified: Secondary | ICD-10-CM | POA: Diagnosis present

## 2013-12-27 DIAGNOSIS — Z9989 Dependence on other enabling machines and devices: Secondary | ICD-10-CM

## 2013-12-27 DIAGNOSIS — G4733 Obstructive sleep apnea (adult) (pediatric): Secondary | ICD-10-CM | POA: Diagnosis present

## 2013-12-27 LAB — GLUCOSE, CAPILLARY: Glucose-Capillary: 83 mg/dL (ref 70–99)

## 2013-12-27 MED ORDER — ATORVASTATIN CALCIUM 40 MG PO TABS: 40.0000 mg | ORAL_TABLET | Freq: Every day | ORAL | Status: DC

## 2013-12-27 MED ORDER — APIXABAN 5 MG PO TABS
5.0000 mg | ORAL_TABLET | Freq: Two times a day (BID) | ORAL | Status: DC
Start: 2013-12-27 — End: 2018-07-03

## 2013-12-27 MED ORDER — PANTOPRAZOLE SODIUM 40 MG PO TBEC
40.0000 mg | DELAYED_RELEASE_TABLET | Freq: Every day | ORAL | Status: DC
  Filled 2013-12-27: qty 1

## 2013-12-27 MED ORDER — APIXABAN 5 MG PO TABS
5.0000 mg | ORAL_TABLET | Freq: Two times a day (BID) | ORAL | Status: DC
  Administered 2013-12-27: 5 mg via ORAL
  Filled 2013-12-27: qty 1

## 2013-12-27 NOTE — Discharge Instructions (Signed)

## 2013-12-27 NOTE — Progress Notes (Signed)
CARE MANAGEMENT NOTE 12/27/2013  Patient:  Ashley White, Ashley White   Account Number:  [id number]  Date Initiated:  12/27/2013  Documentation initiated by:  Olga Coaster  Subjective/Objective Assessment:   ADMITTED WITH STROKE     Action/Plan:   CM FOLLOWING FOR DCP   Anticipated DC Date:  12/30/2013   Anticipated DC Plan:  [place]  CM consult          Status of service:  In process, will continue to follow Medicare Important Message given?   (If response is "NO", the following Medicare IM given date fields will be blank)  Per UR Regulation:  Reviewed for med. necessity/level of care/duration of stay  Comments:  9/28/2015Mindi Slicker RN,BSN,MHA [phone]

## 2013-12-27 NOTE — Progress Notes (Signed)
BENEFIT CHECK FOR ELIQUIS  ---12/27/2013 1426 by Juanito Doom- per rep at [facility]  eliquis: $0/ patient has met out of pocket for the year (if out of pocket wasn't met it would be a $65)/ Josem Kaufmann is required ph # [phone] op 4  patient can use any major retail pharmacies

## 2013-12-27 NOTE — Progress Notes (Signed)
Patient is discharged from room 4N31 at this time. Alert and in stable condition. IV site d/c'd as well as tele. Instructions read to patient and husband and understanding verbalized. Left unit via wheelchair with belongings at side.

## 2013-12-27 NOTE — Clinical Social Work Note (Signed)
CSW Consult Acknowledged:   CSW reviewed consult for evaluating affordability of Eliquis therapy. CSW informed Case Manager, RN regarding consult. CSW will sign off.    [place], MSW, [place]

## 2013-12-27 NOTE — Discharge Summary (Signed)
Stroke Discharge Summary  Patient ID: Ashley White   MRN: [id number]      DOB: [birth year]  Date of Admission: 12/24/2013 Date of Discharge: 12/27/2013  Attending Physician:  Rosalin Hawking, MD, Stroke MD  Patient's PCP:  Lynne Logan, MD  DISCHARGE DIAGNOSIS:  Principal Problem:   Stroke, acute, embolic - L MCA, secondary to known atrial fibrillation Active Problems:   Atrial fibrillation   Other and unspecified hyperlipidemia   OSA on CPAP  BMI: Body mass index is 24.16 kg/(m^2).  Past Medical History  Diagnosis Date  . Cancer 2001    left breast  . Mitral valve prolapse   . Heart murmur     mitral valve  . Sleep apnea     uses CPAP  . Depression   . ADD (attention deficit disorder)    Past Surgical History  Procedure Laterality Date  . Oophorectomy Left   . Right knee tibia surgery      2006  . Breast surgery      left cyst removed  . Breast lumpectomy with radioactive seed localization Right 08/09/2013    Procedure: BREAST LUMPECTOMY WITH RADIOACTIVE SEED LOCALIZATION;  Surgeon: Adin Hector, MD;  Location: [place];  Service: General;  Laterality: Right;  . Cataract extraction        Medication List    STOP taking these medications       aspirin EC 81 MG tablet      TAKE these medications       apixaban 5 MG Tabs tablet  Commonly known as:  ELIQUIS  Take 1 tablet (5 mg total) by mouth 2 (two) times daily.     atorvastatin 40 MG tablet  Commonly known as:  LIPITOR  Take 1 tablet (40 mg total) by mouth daily at 6 PM.     cetirizine 10 MG tablet  Commonly known as:  ZYRTEC  Take 10 mg by mouth at bedtime.     Fish Oil 1000 MG Caps  Take 1,000 mg by mouth at bedtime.     fluticasone 50 MCG/ACT nasal spray  Commonly known as:  FLONASE  Place 2 sprays into both nostrils at bedtime.     lamoTRIgine 100 MG tablet  Commonly known as:  LAMICTAL  Take 100 mg by mouth at bedtime.     metoprolol succinate 25 MG 24 hr tablet   Commonly known as:  TOPROL-XL  Take 25 mg by mouth daily with lunch.     PARoxetine 20 MG tablet  Commonly known as:  PAXIL  Take 20 mg by mouth at bedtime.        LABORATORY STUDIES CBC    Component Value Date/Time   WBC 6.3 12/24/2013 2009   RBC 4.44 12/24/2013 2009   HGB 14.6 12/24/2013 2017   HCT 43.0 12/24/2013 2017   PLT 195 12/24/2013 2009   MCV 87.8 12/24/2013 2009   MCH 30.6 12/24/2013 2009   MCHC 34.9 12/24/2013 2009   RDW 12.4 12/24/2013 2009   LYMPHSABS 2.3 12/24/2013 2009   MONOABS 0.4 12/24/2013 2009   EOSABS 0.1 12/24/2013 2009   BASOSABS 0.0 12/24/2013 2009   CMP    Component Value Date/Time   NA 142 12/24/2013 2017   K 3.6* 12/24/2013 2017   CL 103 12/24/2013 2017   CO2 25 12/24/2013 2009   GLUCOSE 107* 12/24/2013 2017   BUN 18 12/24/2013 2017   CREATININE 0.60 12/24/2013 2017   CALCIUM  9.1 12/24/2013 2009   PROT 6.2 12/24/2013 2009   ALBUMIN 3.4* 12/24/2013 2009   AST 26 12/24/2013 2009   ALT 23 12/24/2013 2009   ALKPHOS 77 12/24/2013 2009   BILITOT 0.2* 12/24/2013 2009   GFRNONAA >90 12/24/2013 2009   GFRAA >90 12/24/2013 2009   COAGS Lab Results  Component Value Date   INR 1.11 12/24/2013   Lipid Panel    Component Value Date/Time   CHOL 186 12/25/2013 0251   TRIG 77 12/25/2013 0251   HDL 51 12/25/2013 0251   CHOLHDL 3.6 12/25/2013 0251   VLDL 15 12/25/2013 0251   LDLCALC 120* 12/25/2013 0251   HgbA1C  Lab Results  Component Value Date   HGBA1C 6.1* 12/25/2013   Cardiac Panel (last 3 results) No results found for this basename: CKTOTAL, CKMB, TROPONINI, RELINDX,  in the last 72 hours Urinalysis    Component Value Date/Time   COLORURINE YELLOW 12/24/2013 2040   APPEARANCEUR CLEAR 12/24/2013 2040   LABSPEC 1.005 12/24/2013 2040   PHURINE 6.5 12/24/2013 2040   GLUCOSEU NEGATIVE 12/24/2013 2040   HGBUR NEGATIVE 12/24/2013 2040   BILIRUBINUR NEGATIVE 12/24/2013 2040   KETONESUR NEGATIVE 12/24/2013 2040   PROTEINUR NEGATIVE 12/24/2013 2040   UROBILINOGEN 0.2  12/24/2013 2040   NITRITE NEGATIVE 12/24/2013 2040   LEUKOCYTESUR NEGATIVE 12/24/2013 2040   Urine Drug Screen     Component Value Date/Time   LABOPIA NONE DETECTED 12/24/2013 2040   COCAINSCRNUR NONE DETECTED 12/24/2013 2040   LABBENZ NONE DETECTED 12/24/2013 2040   AMPHETMU NONE DETECTED 12/24/2013 2040   THCU NONE DETECTED 12/24/2013 2040   LABBARB NONE DETECTED 12/24/2013 2040    Alcohol Level    Component Value Date/Time   ETH <10 12/24/2013 2009     SIGNIFICANT DIAGNOSTIC STUDIES  Ct Head Wo Contrast 12/25/2013    New low density focus in the region of the posterior left insula, consistent with evolving acute infarct. No intracranial hemorrhage.    12/24/2013   No acute intracranial pathology.   Mr Brain Wo Contrast 12/26/2013     Small, acute left MCA territory infarct in the region of the posterior left insula.   Mr Jodene Nam Head/brain Wo Cm 12/26/2013   1. No major intracranial arterial occlusion or significant proximal stenosis. Diminished flow versus occlusion of a left MCA insular branch vessel. 2. Irregularity and mild narrowing of the distal right vertebral artery and basilar artery, although evaluation is limited by motion.     Carotid Doppler  No evidence of hemodynamically significant internal carotid artery stenosis. Vertebral artery flow is antegrade.   2D Echocardiogram  EF 55-60% with no source of embolus.      HISTORY OF PRESENT ILLNESS Ashley White is a 63 y.o. female with a past medical history significant for newly diagnosed atrial fibrillation not taking anticoagulants (low ChadsVasc2), OSA on CPAP, left breast cancer status post lumpectomy, brought in by EMS as a code stroke due to acute onset of code stroke, aphasia, right hemiparesis, confusion. Husband is at the bedside and stated that they were home after having dinner when she became less responsive and was not talking properly 12/24/2013 at 740p. Husband called EMS immediately and she was found to have right  hemiparesis, aphasia, and confusion. Brought to the ED where she had NIHSS 3 and CT brain showed no acute intracranial abnormality. EKG showed sinus rhythm. NIHSS: 3. She was administered IV tPA and admitted for further workup and treatment.    HOSPITAL COURSE Ms.  Ashley White is a 63 y.o. female with history of newly diagnosed atrial fibrillation and obstructive sleep apnea presenting as a code stroke, with aphasia, right hemiparesis, and confusion. She received IV t-PA due to aphasia. Initial CT scan showed no acute intracranial pathology.   Stroke:  L MCA infarct, embolic secondary to known atrial fibrillation.  MRI - Small, acute left MCA territory infarct in the region of the posterior left insula.  MRA - Irregularity and mild narrowing of the distal right vertebral artery and basilar artery, although evaluation is limited by motion.  Carotid Doppler no source of embolus 2D Echo - ejection fraction 60-65%. No cardiac source of emboli identified. aspirin 81 mg orally every day prior to admission, changed to Eliquis 5 mg bid at time of discharge given lower bleeding risk when compared with warfarin. Patient and husband agreed neither wanted to try warfarin and that a NOAC was the best option for them. LDL 120 - add statin  HgbA1c - 6.1  Resultant Mild to moderate aphasia.  Therapy recommendations: none Ongoing aggressive risk factor management  Hyperlipidemia  Home meds: No statin prior to admission.  LDL 120, goal < 100 (<70 for diabetics) - no statin prior to admission - Lipitor started  Continue statin at discharge  Other Stroke Risk Factors  ETOH use  Obstructive sleep apnea, on CPAP at home   Other Active Problems  Mild hypokalemia, 3.6    DISCHARGE EXAM Blood pressure 120/72, pulse 77, temperature 98.7 F (37.1 C), temperature source Oral, resp. rate 18, height 5\' 7"  (1.702 m), weight 70 kg (154 lb 5.2 oz), SpO2 98.00%.  GENERAL: She is in no acute distress.  HEENT:  Normocephalic and atraumatic.  ABDOMEN: soft  EXTREMITIES: No edema  BACK:Normal.  SKIN: Normal by inspection.  MENTAL STATUS: She is awake and alert. She has a mild to moderate word finding difficulties. She does comprehend well. Speech is normal. She is able to name 5 out of 5 objects with mild difficulties however. There is also mild to moderate difficulty with repetition.  CRANIAL NERVES: Pupils are OD 4.5MM OS 4MM , round and reactive to light; extra ocular movements are full, there is no significant nystagmus; visual fields are full; upper and lower facial muscles are normal in strength and symmetric, there is no flattening of the nasolabial folds; tongue is midline; uvula is midline; shoulder elevation is normal.  MOTOR: Normal tone, bulk and strength; no pronator drift.  COORDINATION: Left finger to nose is normal, right finger to nose is normal, No rest tremor; no intention tremor; no postural tremor; no bradykinesia.    Discharge Diet   General thin liquids  DISCHARGE PLAN  Disposition:  Home with husband   eliquis (apixaban) 5 mg bid for secondary stroke prevention. I called [employer] at [phone] for prior approval. They denied coverage because the patient has not tried warfarin. Case Manager gave patient a card for 30 days free drug. MD will have to follow up for prior authorization following discharge as process to override their protocol takes 24h and patient ready for discharge now. Request to override warfarin has to be in writing from medical provider to [employer]. Their # [phone]. I called and spoke with the patient and her husband to inform them.  Ongoing risk factor control by Primary Care Physician.  Follow-up Lynne Logan, MD in 2 weeks.  Follow-up with Dr. Rosalin Hawking, Stroke Clinic in 2 months.  45 minutes were spent preparing discharge.  Burnetta Sabin, MSN, RN, ANVP-BC, ANP-BC, [facility] Pager:  [phone] 12/27/2013 4:12 PM   I, the attending vascular neurologist, have personally obtained a history, examined the patient, evaluated laboratory data, individually viewed imaging studies, and formulated the assessment and plan of care.  I have made any additions or clarifications directly to the above note and agree with the findings and plan as currently documented.   Rosalin Hawking, MD PhD Stroke Neurology 12/28/2013 10:33 PM

## 2014-01-26 ENCOUNTER — Ambulatory Visit (INDEPENDENT_AMBULATORY_CARE_PROVIDER_SITE_OTHER): Payer: No Typology Code available for payment source | Admitting: Nurse Practitioner

## 2014-01-26 ENCOUNTER — Encounter: Payer: Self-pay | Admitting: Nurse Practitioner

## 2014-01-26 VITALS — BP 111/69 | HR 80 | Ht 66.0 in | Wt 149.0 lb

## 2014-01-26 DIAGNOSIS — I6340 Cerebral infarction due to embolism of unspecified cerebral artery: Secondary | ICD-10-CM

## 2014-01-26 DIAGNOSIS — I480 Paroxysmal atrial fibrillation: Secondary | ICD-10-CM

## 2014-01-26 DIAGNOSIS — I639 Cerebral infarction, unspecified: Secondary | ICD-10-CM

## 2014-01-26 NOTE — Patient Instructions (Signed)
Continue eliquis (apixaban) for atrial fibrillation and for secondary stroke prevention and maintain strict control of hypertension with blood pressure goal below 130/90, and lipids with LDL cholesterol goal below 100 mg/dL.  Followup in the future with Dr. Erlinda Hong in 3-4 months, sooner as needed.   Stroke Prevention Some medical conditions and behaviors are associated with an increased chance of having a stroke. You may prevent a stroke by making healthy choices and managing medical conditions. HOW CAN I REDUCE MY RISK OF HAVING A STROKE?   Stay physically active. Get at least 30 minutes of activity on most or all days.  Do not smoke. It may also be helpful to avoid exposure to secondhand smoke.  Limit alcohol use. Moderate alcohol use is considered to be:  No more than 2 drinks per day for men.  No more than 1 drink per day for nonpregnant women.  Eat healthy foods. This involves:  Eating 5 or more servings of fruits and vegetables a day.  Making dietary changes that address high blood pressure (hypertension), high cholesterol, diabetes, or obesity.  Manage your cholesterol levels.  Making food choices that are high in fiber and low in saturated fat, trans fat, and cholesterol may control cholesterol levels.  Take any prescribed medicines to control cholesterol as directed by your health care provider.  Manage your diabetes.  Controlling your carbohydrate and sugar intake is recommended to manage diabetes.  Take any prescribed medicines to control diabetes as directed by your health care provider.  Control your hypertension.  Making food choices that are low in salt (sodium), saturated fat, trans fat, and cholesterol is recommended to manage hypertension.  Take any prescribed medicines to control hypertension as directed by your health care provider.  Maintain a healthy weight.  Reducing calorie intake and making food choices that are low in sodium, saturated fat, trans fat,  and cholesterol are recommended to manage weight.  Stop drug abuse.  Avoid taking birth control pills.  Talk to your health care provider about the risks of taking birth control pills if you are over 68 years old, smoke, get migraines, or have ever had a blood clot.  Get evaluated for sleep disorders (sleep apnea).  Talk to your health care provider about getting a sleep evaluation if you snore a lot or have excessive sleepiness.  Take medicines only as directed by your health care provider.  For some people, aspirin or blood thinners (anticoagulants) are helpful in reducing the risk of forming abnormal blood clots that can lead to stroke. If you have the irregular heart rhythm of atrial fibrillation, you should be on a blood thinner unless there is a good reason you cannot take them.  Understand all your medicine instructions.  Make sure that other conditions (such as anemia or atherosclerosis) are addressed. SEEK IMMEDIATE MEDICAL CARE IF:   You have sudden weakness or numbness of the face, arm, or leg, especially on one side of the body.  Your face or eyelid droops to one side.  You have sudden confusion.  You have trouble speaking (aphasia) or understanding.  You have sudden trouble seeing in one or both eyes.  You have sudden trouble walking.  You have dizziness.  You have a loss of balance or coordination.  You have a sudden, severe headache with no known cause.  You have new chest pain or an irregular heartbeat. Any of these symptoms may represent a serious problem that is an emergency. Do not wait to see if the  symptoms will go away. Get medical help at once. Call your local emergency services (911 in [place] Do not drive yourself to the hospital. Document Released: 04/25/2004 Document Revised: 08/02/2013 Document Reviewed: 09/18/2012 [facility] Patient Information 2015 [place], [place]. This information is not intended to replace advice given to you by your health care  provider. Make sure you discuss any questions you have with your health care provider.

## 2014-01-26 NOTE — Progress Notes (Signed)
PATIENT: Ashley White DOB: [birth year]  REASON FOR VISIT: hospital follow up for stroke HISTORY FROM: patient and husband  HISTORY OF PRESENT ILLNESS: Ashley White is a 63 y.o. Female who comes to the office for first hospital follow up post hospital discharge for stroke. She has a past medical history significant for newly diagnosed atrial fibrillation not taking anticoagulants, on baby aspirin daily only (low ChadsVasc2), OSA on CPAP, left breast cancer status post lumpectomy, brought in by EMS as a code stroke due to acute onset of aphasia, right hemiparesis, confusion. Husband stated that they were home after having dinner when she became less responsive and was not talking properly 12/24/2013 at 740p. Husband called EMS immediately and she was found to have right hemiparesis, aphasia, and confusion. Brought to the ED where she had NIHSS 3 and CT brain showed no acute intracranial abnormality. EKG showed sinus rhythm. NIHSS: 3. She was administered IV tPA and admitted for further workup and treatment. MRI showed a small, acute left MCA territory infarct in the region of the posterior left insula.  MRA showed irregularity and mild narrowing of the distal right vertebral artery and basilar artery, although evaluation was limited by motion. Carotid Doppler with no source of embolus. 2D Echo with ejection fraction of 60-65%. No cardiac source of emboli identified. She tolerated tPa well and was back to baseline in 24 hours. She had no therapy needs. She was started on Eliquis daily and has tolerated it well without side effects. She has chosen not to continue with a statin medication, trying to reduce lipids with diet and exercise.  REVIEW OF SYSTEMS: Full 14 system review of systems performed and notable only for: palpitations, apnea, depression  ALLERGIES: Allergies  Allergen Reactions  . Penicillins Other (See Comments)    Childhood allergic reaction    HOME MEDICATIONS: Outpatient  Prescriptions Prior to Visit  Medication Sig Dispense Refill  . apixaban (ELIQUIS) 5 MG TABS tablet Take 1 tablet (5 mg total) by mouth 2 (two) times daily.  60 tablet  2  . cetirizine (ZYRTEC) 10 MG tablet Take 10 mg by mouth at bedtime.       . fluticasone (FLONASE) 50 MCG/ACT nasal spray Place 2 sprays into both nostrils at bedtime.       . lamoTRIgine (LAMICTAL) 100 MG tablet Take 100 mg by mouth at bedtime.       . metoprolol succinate (TOPROL-XL) 25 MG 24 hr tablet Take 25 mg by mouth daily with lunch.       . Omega-3 Fatty Acids (FISH OIL) 1000 MG CAPS Take 1,000 mg by mouth at bedtime.       [name] PARoxetine (PAXIL) 20 MG tablet Take 20 mg by mouth at bedtime.       [name] atorvastatin (LIPITOR) 40 MG tablet Take 1 tablet (40 mg total) by mouth daily at 6 PM.  30 tablet  2   No facility-administered medications prior to visit.    PHYSICAL EXAM Filed Vitals:   01/26/14 1124  BP: 111/69  Pulse: 80  Height: 5\' 6"  (1.676 m)  Weight: 149 lb (67.586 kg)   Body mass index is 24.06 kg/(m^2).  Visual Acuity Screening   Right eye Left eye Both eyes  Without correction:     With correction: 20/40 20/40 20/40    Generalized: Well developed, in no acute distress, pleasant Caucasian female Head: normocephalic and atraumatic. Oropharynx benign  Neck: Supple, no carotid bruits  Cardiac: Irregular rate and  rhythm, no murmur  Musculoskeletal: No deformity   Neurological examination  Mentation: Alert oriented to time, place, history taking. Follows all commands. She has a mild to moderate word finding difficulties. She does comprehend well.  Cranial nerve II-XII: Fundoscopic exam not done. Pupils were equal round reactive to light extraocular movements were full, visual field were full on confrontational test. Facial sensation and strength were normal. hearing was intact to finger rubbing bilaterally. Uvula tongue midline. head turning and shoulder shrug and were normal and symmetric.Tongue protrusion  into cheek strength was normal. Motor: The motor testing reveals 5 over 5 strength of all 4 extremities. Good symmetric motor tone is noted throughout.  Sensory: Sensory testing is intact to soft touch on all 4 extremities. No evidence of extinction is noted.  Coordination: Cerebellar testing reveals good finger-nose-finger and heel-to-shin bilaterally.  Gait and station: Gait is normal. Tandem gait is normal. Romberg is negative. Reflexes: Deep tendon reflexes are symmetric and normal bilaterally.  NIHSS: 0 MRs: 1  ASSESSMENT: [name] is a 63 y.o. female with history of newly diagnosed atrial fibrillation and obstructive sleep apnea presenting as a code stroke, with aphasia, right hemiparesis, and confusion. She received IV t-PA due to aphasia.  MRI showed a Left MCA infarct, embolic secondary to known atrial fibrillation. She is back to her baseline with mild anomia.  PLAN: I had a long discussion with the patient and family regarding her recent stroke, discussed results of evaluation in the hospital and answered questions. Continue eliquis (apixaban) for atrial fibrillation and for secondary stroke prevention and maintain strict control of hypertension with blood pressure goal below 130/90, and lipids with LDL cholesterol goal below 100 mg/dL.  Followup in the future with Dr. Erlinda Hong in 3-4 months, sooner as needed.  Rudi Rummage Floetta Brickey, MSN, FNP-BC, A/GNP-C 01/26/2014, 11:34 AM [facility] [address], [place], [place] [address] [phone]  Note: This document was prepared with digital dictation and possible smart phrase technology. Any transcriptional errors that result from this process are unintentional.

## 2014-04-27 NOTE — Progress Notes (Signed)
I reviewed above note and agree with the assessment and plan.  Rosalin Hawking, MD PhD Stroke Neurology 04/27/2014 12:48 PM

## 2014-04-28 ENCOUNTER — Ambulatory Visit (INDEPENDENT_AMBULATORY_CARE_PROVIDER_SITE_OTHER): Payer: No Typology Code available for payment source | Admitting: Neurology

## 2014-04-28 ENCOUNTER — Encounter: Payer: Self-pay | Admitting: Neurology

## 2014-04-28 VITALS — BP 93/64 | HR 140 | Ht 66.0 in | Wt 153.0 lb

## 2014-04-28 DIAGNOSIS — I6340 Cerebral infarction due to embolism of unspecified cerebral artery: Secondary | ICD-10-CM

## 2014-04-28 DIAGNOSIS — I480 Paroxysmal atrial fibrillation: Secondary | ICD-10-CM

## 2014-04-28 DIAGNOSIS — I639 Cerebral infarction, unspecified: Secondary | ICD-10-CM

## 2014-04-28 DIAGNOSIS — E785 Hyperlipidemia, unspecified: Secondary | ICD-10-CM

## 2014-04-28 NOTE — Patient Instructions (Signed)
-   continue eliquis for stroke prevention, do not miss doses - continue diet and exercise for HLD control. Recommend to check fasting lipid in March with PCP for cholesterol monitoring. - continue to follow up with cardiology for afib  - Follow up with your primary care physician for stroke risk factor modification. Recommend maintain blood pressure goal <130/80, diabetes with hemoglobin A1c goal below 6.5% and lipids with LDL cholesterol goal below 70 mg/dL.  - follow up in 6 months.

## 2014-04-28 NOTE — Progress Notes (Signed)
STROKE NEUROLOGY FOLLOW UP NOTE  NAME: Ashley White DOB: January 18, 1951  REASON FOR VISIT: stroke follow up HISTORY FROM: chart and pt  Today we had the pleasure of seeing Ashley White in follow-up at our Neurology Clinic. Pt was accompanied by no one.   History Summary Ashley White is a 64 y.o. Female with PMH significant for newly diagnosed atrial fibrillation not taking anticoagulants, on baby aspirin daily only (low ChadsVasc2), OSA on CPAP, left breast cancer status post lumpectomy was admitted on 12/24/13 due to acute onset of aphasia, right hemiparesis, confusion. In ER her NIHSS 3 and CT brain showed no acute intracranial abnormality. EKG showed sinus rhythm. She was administered IV tPA and admitted for further workup and treatment. MRI showed a small, acute left MCA territory infarct in the region of the posterior left insula. MRA showed irregularity and mild narrowing of the distal right vertebral artery and basilar artery, although evaluation was limited by motion. Carotid Doppler with no source of embolus. 2D Echo with ejection fraction of 60-65%. No cardiac source of emboli identified. She tolerated tPA well and was back to baseline in 24 hours. She had no therapy needs. She was discharged on Eliquis and lipitor.  Follow up 01/26/14 (LL) - she is on eliquis and has tolerated it well without side effects. However, she did not take lipitor and she has chosen not to continue with a statin medication, trying to reduce lipids with diet and exercise.  Interval History During the interval time, the patient has been doing well. No residue deficit left and no recurrent neuro symptoms. She is back to her baseline. She continues with eliquis compliant without side effect. She is going to see her PCP in March. BP 101/69.    REVIEW OF SYSTEMS: Full 14 system review of systems performed and notable only for those listed below and in HPI above, all others are negative:  Constitutional:     Cardiovascular:  Ear/Nose/Throat:   Skin:  Eyes:   Respiratory:   Gastroitestinal:  Nausea, abdominal pain Genitourinary:  Hematology/Lymphatic:   Endocrine: heat intolerance Musculoskeletal:  Back pain, neck pain Allergy/Immunology:   Neurological:   Psychiatric:  Sleep: apnea  The following represents the patient's updated allergies and side effects list: Allergies  Allergen Reactions  . Penicillins Other (See Comments)    Childhood allergic reaction    The neurologically relevant items on the patient's problem list were reviewed on today's visit.  Neurologic Examination  A problem focused neurological exam (12 or more points of the single system neurologic examination, vital signs counts as 1 point, cranial nerves count for 8 points) was performed.  Blood pressure 93/64, pulse 140, height 5\' 6"  (1.676 m), weight 153 lb (69.4 kg).  General - Well nourished, well developed, in no apparent distress.  Ophthalmologic - Sharp disc margins OU.  Cardiovascular - Regular rate and rhythm with no murmur, no afib but tachycardia.  Mental Status -  Level of arousal and orientation to time, place, and person were intact. Language including expression, naming, repetition, comprehension was assessed and found intact.  Cranial Nerves II - XII - II - Visual field intact OU. III, IV, VI - Extraocular movements intact. V - Facial sensation intact bilaterally. VII - Facial movement intact bilaterally. VIII - Hearing & vestibular intact bilaterally. X - Palate elevates symmetrically. XI - Chin turning & shoulder shrug intact bilaterally. XII - Tongue protrusion intact.  Motor Strength - The patient's strength was normal in all  extremities and pronator drift was absent.  Bulk was normal and fasciculations were absent.   Motor Tone - Muscle tone was assessed at the neck and appendages and was normal.  Reflexes - The patient's reflexes were normal in all extremities and she had no  pathological reflexes.  Sensory - Light touch, temperature/pinprick, vibration and proprioception, and Romberg testing were assessed and were normal.    Coordination - The patient had normal movements in the hands and feet with no ataxia or dysmetria.  Tremor was absent.  Gait and Station - The patient's transfers, posture, gait, station, and turns were observed as normal.  Data reviewed: I personally reviewed the images and agree with the radiology interpretations.  Ct Head Wo Contrast 12/25/2013 New low density focus in the region of the posterior left insula, consistent with evolving acute infarct. No intracranial hemorrhage.  12/24/2013 No acute intracranial pathology.   Mr Brain Wo Contrast 12/26/2013 Small, acute left MCA territory infarct in the region of the posterior left insula.   Mr Jodene Nam Head/brain Wo Cm 12/26/2013 1. No major intracranial arterial occlusion or significant proximal stenosis. Diminished flow versus occlusion of a left MCA insular branch vessel. 2. Irregularity and mild narrowing of the distal right vertebral artery and basilar artery, although evaluation is limited by motion.   Carotid Doppler No evidence of hemodynamically significant internal carotid artery stenosis. Vertebral artery flow is antegrade.   2D Echocardiogram EF 55-60% with no source of embolus  Component     Latest Ref Rng 12/25/2013  Cholesterol     0 - 200 mg/dL 186  Triglycerides     <150 mg/dL 77  HDL     >39 mg/dL 51  Total CHOL/HDL Ratio      3.6  VLDL     0 - 40 mg/dL 15  LDL (calc)     0 - 99 mg/dL 120 (H)  Hgb A1c MFr Bld     <5.7 % 6.1 (H)  Mean Plasma Glucose     <117 mg/dL 128 (H)    Assessment: As you may recall, she is a 64 y.o. Caucasian female with PMH of newly diagnosed atrial fibrillation not taking anticoagulants, on baby aspirin daily only (low ChadsVasc2), OSA on CPAP, left breast cancer s/p lumpectomy in 2001 was admitted on 12/24/13 due to acute  onset of aphasia, right hemiparesis, confusion. NIHSS=3, s/p tPA. MRI showed MRI showed a small, acute left MCA territory infarct in the region of the posterior left insula.She was started on eliquis and lipitor. Currently, she is doing well without side effect from eliquis. She optioned no statin but diet and exercise control. No residue deficit.  Plan:  - continue eliquis for stroke prevention - diet and exercise for HLD. Encourage to check fasting lipid panel in March with PCP and decide on statin based on the result - continue to follow up with cardiology for afib - Follow up with your primary care physician for stroke risk factor modification. Recommend maintain blood pressure goal <130/80, diabetes with hemoglobin A1c goal below 6.5% and lipids with LDL cholesterol goal below 70 mg/dL.  - RTC in 6 months  No orders of the defined types were placed in this encounter.    Meds ordered this encounter  Medications  . DISCONTD: flecainide (TAMBOCOR) 50 MG tablet    Sig: Take 50 mg by mouth 2 (two) times daily.    Refill:  12  . flecainide (TAMBOCOR) 50 MG tablet    Sig: Take 50  mg by mouth 2 (two) times daily. 2 TABLETS EVERY MORNING & 1 TABLET EVERY EVENING    Patient Instructions  - continue eliquis for stroke prevention, do not miss doses - continue diet and exercise for HLD control. Recommend to check fasting lipid in March with PCP for cholesterol monitoring. - continue to follow up with cardiology for afib  - Follow up with your primary care physician for stroke risk factor modification. Recommend maintain blood pressure goal <130/80, diabetes with hemoglobin A1c goal below 6.5% and lipids with LDL cholesterol goal below 70 mg/dL.  - follow up in 6 months.    Rosalin Hawking, MD PhD [facility] [address] [place], [place] [address] [phone]

## 2014-05-09 ENCOUNTER — Telehealth: Payer: Self-pay | Admitting: Neurology

## 2014-05-09 NOTE — Telephone Encounter (Signed)
Spoke with patient and states that testing was for fasting lipid panel, called Dr Lynnda Child office and was informed that normally they do not draw blood at another doctor's request but will make an exception this one time. Gave Tammy the Dx code E78.5 for Hyperlipidemia, informed that patient must be fasting. Also faxed over office notes from patient's last visit on 04/28/14, and Tammy was given our fax number to send results of blood work

## 2014-05-09 NOTE — Telephone Encounter (Signed)
Patient is calling about her upcoming cholestrol test and need to know the diagnosis code for exactly what you want.  Please call.  Can leave message.

## 2014-10-27 ENCOUNTER — Encounter: Payer: Self-pay | Admitting: Neurology

## 2014-10-27 ENCOUNTER — Ambulatory Visit (INDEPENDENT_AMBULATORY_CARE_PROVIDER_SITE_OTHER): Payer: 59 | Admitting: Neurology

## 2014-10-27 VITALS — BP 92/66 | HR 91 | Ht 65.0 in | Wt 153.4 lb

## 2014-10-27 DIAGNOSIS — I480 Paroxysmal atrial fibrillation: Secondary | ICD-10-CM

## 2014-10-27 DIAGNOSIS — I639 Cerebral infarction, unspecified: Secondary | ICD-10-CM

## 2014-10-27 DIAGNOSIS — I6340 Cerebral infarction due to embolism of unspecified cerebral artery: Secondary | ICD-10-CM

## 2014-10-27 DIAGNOSIS — E785 Hyperlipidemia, unspecified: Secondary | ICD-10-CM | POA: Diagnosis not present

## 2014-10-27 NOTE — Patient Instructions (Signed)
-   continue eliquis and lipitor for stroke prevention - continue to follow up with Cardiology for afib - Follow up with your primary care physician for stroke risk factor modification. Recommend maintain blood pressure goal <130/80, diabetes with hemoglobin A1c goal below 6.5% and lipids with LDL cholesterol goal below 70 mg/dL.  - fax [place] the recent cholesterol result for documentation.  - RTC in 6 months

## 2014-10-27 NOTE — Progress Notes (Signed)
STROKE NEUROLOGY FOLLOW UP NOTE  NAME: Ashley White DOB: 13-Jun-1950  REASON FOR VISIT: stroke follow up HISTORY FROM: chart and pt  Today we had the pleasure of seeing [name] in follow-up at our Neurology Clinic. Pt was accompanied by no one.   History Summary Ashley White is a 64 y.o. Female with PMH significant for newly diagnosed atrial fibrillation not taking anticoagulants, on baby aspirin daily only (low ChadsVasc2), OSA on CPAP, left breast cancer status post lumpectomy was admitted on 12/24/13 due to acute onset of aphasia, right hemiparesis, confusion. In ER her NIHSS 3 and CT brain showed no acute intracranial abnormality. EKG showed sinus rhythm. She was administered IV tPA and admitted for further workup and treatment. MRI showed a small, acute left MCA territory infarct in the region of the posterior left insula. MRA showed irregularity and mild narrowing of the distal right vertebral artery and basilar artery, although evaluation was limited by motion. Carotid Doppler with no source of embolus. 2D Echo with ejection fraction of 60-65%. No cardiac source of emboli identified. She tolerated tPA well and was back to baseline in 24 hours. She had no therapy needs. She was discharged on Eliquis and lipitor.  Follow up 01/26/14 (LL) - she is on eliquis and has tolerated it well without side effects. However, she did not take lipitor and she has chosen not to continue with a statin medication, trying to reduce lipids with diet and exercise.  Follow up 04/28/14 - the patient has been doing well. No residue deficit left and no recurrent neuro symptoms. She is back to her baseline. She continues with eliquis compliant without side effect. She is going to see her PCP in March. BP 101/69.  Interval History During the interval time, pt has been doing well. On eliquis and liptor, no complains. Bp in good control at home 110/80 but today in clinic 92/44. She follow up with her cardiologist and  put on lipitor 40mg . Did not remember the latest LDL level with PCP. She is going to have colonoscopy done soon and may have to stop eliquis briefly for the procedure.  REVIEW OF SYSTEMS: Full 14 system review of systems performed and notable only for those listed below and in HPI above, all others are negative:  Constitutional:   Cardiovascular:  Ear/Nose/Throat:   Skin:  Eyes:   Respiratory:   Gastroitestinal:   Genitourinary:  Hematology/Lymphatic:   Endocrine: heat intolerance Musculoskeletal:   Allergy/Immunology:   Neurological:   Psychiatric: nervous and anxious Sleep:   The following represents the patient's updated allergies and side effects list: Allergies  Allergen Reactions  . Penicillins Other (See Comments)    Childhood allergic reaction    The neurologically relevant items on the patient's problem list were reviewed on today's visit.  Neurologic Examination  A problem focused neurological exam (12 or more points of the single system neurologic examination, vital signs counts as 1 point, cranial nerves count for 8 points) was performed.  Blood pressure 92/66, pulse 91, height 5\' 5"  (1.651 m), weight 153 lb 6.4 oz (69.582 kg).  General - Well nourished, well developed, in no apparent distress.  Ophthalmologic - Sharp disc margins OU.  Cardiovascular - Regular rate and rhythm with no murmur, no afib rhythm.  Mental Status -  Level of arousal and orientation to time, place, and person were intact. Language including expression, naming, repetition, comprehension was assessed and found intact.  Cranial Nerves II - XII - II -  Visual field intact OU. III, IV, VI - Extraocular movements intact. V - Facial sensation intact bilaterally. VII - Facial movement intact bilaterally. VIII - Hearing & vestibular intact bilaterally. X - Palate elevates symmetrically. XI - Chin turning & shoulder shrug intact bilaterally. XII - Tongue protrusion intact.  Motor  Strength - The patient's strength was normal in all extremities and pronator drift was absent.  Bulk was normal and fasciculations were absent.   Motor Tone - Muscle tone was assessed at the neck and appendages and was normal.  Reflexes - The patient's reflexes were normal in all extremities and she had no pathological reflexes.  Sensory - Light touch, temperature/pinprick, vibration and proprioception, and Romberg testing were assessed and were normal.    Coordination - The patient had normal movements in the hands and feet with no ataxia or dysmetria.  Tremor was absent.  Gait and Station - The patient's transfers, posture, gait, station, and turns were observed as normal.  Data reviewed: I personally reviewed the images and agree with the radiology interpretations.  Ct Head Wo Contrast 12/25/2013 New low density focus in the region of the posterior left insula, consistent with evolving acute infarct. No intracranial hemorrhage.  12/24/2013 No acute intracranial pathology.   Mr Brain Wo Contrast 12/26/2013 Small, acute left MCA territory infarct in the region of the posterior left insula.   Mr Jodene Nam Head/brain Wo Cm 12/26/2013 1. No major intracranial arterial occlusion or significant proximal stenosis. Diminished flow versus occlusion of a left MCA insular branch vessel. 2. Irregularity and mild narrowing of the distal right vertebral artery and basilar artery, although evaluation is limited by motion.   Carotid Doppler No evidence of hemodynamically significant internal carotid artery stenosis. Vertebral artery flow is antegrade.   2D Echocardiogram EF 55-60% with no source of embolus  Component     Latest Ref Rng 12/25/2013  Cholesterol     0 - 200 mg/dL 186  Triglycerides     <150 mg/dL 77  HDL     >39 mg/dL 51  Total CHOL/HDL Ratio      3.6  VLDL     0 - 40 mg/dL 15  LDL (calc)     0 - 99 mg/dL 120 (H)  Hgb A1c MFr Bld     <5.7 % 6.1 (H)  Mean Plasma  Glucose     <117 mg/dL 128 (H)    Assessment: As you may recall, she is a 64 y.o. Caucasian female with PMH of newly diagnosed atrial fibrillation not taking anticoagulants, on baby aspirin daily only (low ChadsVasc2), OSA on CPAP, left breast cancer s/p lumpectomy in 2001 was admitted on 12/24/13 due to acute onset of aphasia, right hemiparesis, confusion. NIHSS=3, s/p tPA. MRI showed MRI showed a small, acute left MCA territory infarct in the region of the posterior left insula.She was started on eliquis and lipitor. Currently, she is doing well without side effect from eliquis. She was started with lipitor 40 mg by her cardiology. No residue deficit.  Plan:  - continue eliquis and lipitor for stroke prevention - continue to follow up with Cardiology for afib - Follow up with your primary care physician for stroke risk factor modification. Recommend maintain blood pressure goal <130/80, diabetes with hemoglobin A1c goal below 6.5% and lipids with LDL cholesterol goal below 70 mg/dL.  - check BP at home  - RTC in 6 months  No orders of the defined types were placed in this encounter.    No  orders of the defined types were placed in this encounter.    Patient Instructions  - continue eliquis and lipitor for stroke prevention - continue to follow up with Cardiology for afib - Follow up with your primary care physician for stroke risk factor modification. Recommend maintain blood pressure goal <130/80, diabetes with hemoglobin A1c goal below 6.5% and lipids with LDL cholesterol goal below 70 mg/dL.  - fax [place] the recent cholesterol result for documentation.  - RTC in 6 months    Rosalin Hawking, MD PhD [facility] [address] [place], [place] [address] [phone]

## 2014-11-08 ENCOUNTER — Telehealth: Payer: Self-pay | Admitting: Neurology

## 2014-11-08 NOTE — Telephone Encounter (Signed)
Pt is calling back with lipid panel results: overall 210, triglycerides 62, LDL 133, HDL 65 and new dosage of lipitor 40mg  tablet once per day that she is taking

## 2014-11-10 NOTE — Telephone Encounter (Signed)
Called pt back and discussed with her over the phone. The numbers below are from 05/2014. She just started lipitor 40mg  about 2 weeks ago and she is going to follow up with PCP to keep monitoring the LDL. She expressed understanding and appreciation.  Rosalin Hawking, MD PhD Stroke Neurology 11/10/2014 4:16 PM

## 2015-01-30 ENCOUNTER — Other Ambulatory Visit: Payer: Self-pay | Admitting: Gastroenterology

## 2015-01-30 ENCOUNTER — Other Ambulatory Visit: Payer: Self-pay | Admitting: Neurology

## 2015-01-30 DIAGNOSIS — Z800 Family history of malignant neoplasm of digestive organs: Secondary | ICD-10-CM

## 2015-01-30 NOTE — Progress Notes (Signed)
New labs from PCP  01-05-2015  Cholesterol total 146 LDL 74 HDL 63 TG 46  Rosalin Hawking, MD PhD Stroke Neurology 01/30/2015 8:05 AM

## 2015-02-07 ENCOUNTER — Ambulatory Visit
Admission: RE | Admit: 2015-02-07 | Discharge: 2015-02-07 | Disposition: A | Discharge status: Home / Self Care | Payer: 59 | Source: Ambulatory Visit | Attending: Gastroenterology | Admitting: Gastroenterology

## 2015-02-07 DIAGNOSIS — Z800 Family history of malignant neoplasm of digestive organs: Secondary | ICD-10-CM

## 2015-05-08 ENCOUNTER — Ambulatory Visit: Payer: 59 | Admitting: Neurology

## 2015-05-09 ENCOUNTER — Encounter: Payer: Self-pay | Admitting: Neurology

## 2015-08-30 ENCOUNTER — Encounter: Payer: Self-pay | Admitting: Internal Medicine

## 2015-08-30 ENCOUNTER — Encounter: Payer: Self-pay | Admitting: Cardiology

## 2015-08-30 NOTE — Progress Notes (Signed)
Patient ID: Dynisha Badua Burkey, female   DOB: 04/05/1950, 65 y.o.   MRN: [id number]   [place], [place]    Date of visit:  08/30/2015 DOB:  1950/07/18    Age:  65 yrs. Medical record number:  [id number]     Account number:  [id number] Primary Care Provider: [name] ____________________________ CURRENT DIAGNOSES  1. Paroxysmal atrial fibrillation  2. Personal history of malignant neoplasm of breast  3. Cerebral Infarction Due To Embolism Of Left Middle Cerebral Artery  4. Long term (current) use of anticoagulants  5. Hyperlipidemia  6. Sleep apnea ____________________________ ALLERGIES  penicillin, Rash ____________________________ MEDICATIONS  1. lamotrigine 100 mg tablet, 1 p.o. daily  2. paroxetine 20 mg tablet, 1 p.o. daily  3. Zyrtec 10 mg tablet, 1 p.o. daily  4. Fish Oil Concentrate 1,000 mg capsule, Take as directed  5. atorvastatin 40 mg tablet, 1 p.o. daily  6. metoprolol succinate ER 25 mg tablet,extended release 24 hr, 1 p.o. daily  7. flecainide 50 mg tablet, 2 qam  1 qpm  8. Eliquis 5 mg tablet, BID  9. Flonase Allergy Relief 50 mcg/actuation nasal spray,suspension, PRN  10. flecainide 100 mg tablet, one tablet twice a day ____________________________ CHIEF COMPLAINTS  Followup of Paroxysmal atrial fibrillation ____________________________ HISTORY OF PRESENT ILLNESS The patient seen earlier for evaluation of atrial fibrillation. She has a history of paroxysmal atrial fibrillation with a previous CHA2DS2VASC score of one but had a stroke that she made a reasonable recovery from. Following that she was placed on anticoagulation and has been on flecainide and low-dose metoprolol and has been feeling well. She has had very little in the way of atrial fibrillation but recently developed recurrent atrial fibrillation. She noted that this caused difficulty with fatigue as well as lack of energy walking up hills. She has now been out of rhythm for about a week or so. She has been compliant with  her anticoagulation. She denies angina and has no PND orthopnea or edema. She does have known sleep apnea and wears a CPAP device. She denies PND, orthopnea or edema. ____________________________ PAST HISTORY  Past Medical Illnesses:  history of breast cancer 2001 treated with radiation, partial mastectomy and tamoxifen ([address]), depression, sleep apnea, CVA without deficits;  Cardiovascular Illnesses:  mitral valve prolapse, atrial fibrillation;  Surgical Procedures:  breast lumpectomy, cataract extraction, oophorectomy, left partial mastectomy 2001;  NYHA Classification:  I;  Canadian Angina Classification:  Class 0: Asymptomatic;  Cardiology Procedures-Invasive:  no previous interventional or invasive cardiology procedures;  Cardiology Procedures-Noninvasive:  event monitor August 2015, echocardiogram September 2015;  LVEF of 60% documented via echocardiogram on 12/25/2013,  CHADS Score:  2,  CHA2DS2-VASC Score:  3 ____________________________ CARDIO-PULMONARY TEST DATES EKG Date:  08/30/2015;  Holter/Event Monitor Date: 11/25/2013;  Echocardiography Date: 12/13/2013;  Chest Xray Date: 11/24/2013;   ____________________________ FAMILY HISTORY Father -- Father dead, Colorectal cancer Mother -- Mother dead, Congestive heart failure, Heart Attack Sister -- Sister alive with problem, Malignant melanoma ____________________________ SOCIAL HISTORY Alcohol Use:  socially;  Smoking:  nonsmoker;  Diet:  regular diet;  Exercise:  some exercise;  Occupation:  retired;  Residence:  lives with husband;   ____________________________ PHYSICAL EXAMINATION VITAL SIGNS  Blood Pressure:  100/64 Sitting, Right arm, regular cuff  , 102/58 Standing, Right arm and regular cuff   Pulse:  80/min. Weight:  163.00 lbs. Height:  66"BMI: 26  Constitutional:  pleasant white female, in no acute distress Skin:  warm and dry to touch, no  apparent skin lesions, or masses noted. Head:  normocephalic, normal hair  pattern, no masses or tenderness Chest:  normal symmetry, clear to auscultation. Cardiac:  regular rhythm, normal S1 and S2, No S3 or S4, no murmurs, gallops or rubs detected. Peripheral Pulses:  the femoral,dorsalis pedis, and posterior tibial pulses are full and equal bilaterally with no bruits auscultated. Neurological:  no gross motor or sensory deficits noted, affect appropriate, oriented x3. ____________________________ MOST RECENT LIPID PANEL 01/05/15  CHOL TOTL 146 mg/dl, LDL 74 NM, HDL 63 mg/dl, TRIGLYCER 46 mg/dl and CHOL/HDL 2.3 (Calc) ____________________________ IMPRESSIONS/PLAN  1. Paroxysmal atrial fibrillation with recent recurrence 2. Previous history of MCA stroke 3. Sleep apnea treated  RECOMMENDATIONS:  Patient has recurrent atrial fibrillation which is symptomatic. Extensive conversation about atrial fibrillation and its management. Recommended an echocardiogram to reassess atrial size. I would like her to increase her flecainide 100 mg twice daily. We talked about potential role of ablation at her age in terms of reduction in atrial fibrillation and also talked about its role in the overall management of atrial fibrillation as well as possibility of success with reduction or elimination of atrial fibrillation being around 60-70% with the first ablation. We talked about the role of subsequent ablations and she and her husband would be interested in talking with the electrophysiologist about this. EKG shows atrial fibrillation controlled response.  ____________________________ TODAYS ORDERS  1. 12 Lead EKG: Today  2.  Consult Dr. Rayann Heman: Schedule at convenience                       ____________________________ Cardiology Physician:  Kerry Hough MD [facility]

## 2015-09-18 ENCOUNTER — Encounter: Payer: Self-pay | Admitting: Internal Medicine

## 2015-09-20 ENCOUNTER — Ambulatory Visit (INDEPENDENT_AMBULATORY_CARE_PROVIDER_SITE_OTHER): Payer: BLUE CROSS/BLUE SHIELD | Admitting: Internal Medicine

## 2015-09-20 ENCOUNTER — Encounter: Payer: Self-pay | Admitting: Internal Medicine

## 2015-09-20 VITALS — BP 124/66 | HR 84 | Ht 66.0 in | Wt 165.0 lb

## 2015-09-20 DIAGNOSIS — I480 Paroxysmal atrial fibrillation: Secondary | ICD-10-CM

## 2015-09-20 DIAGNOSIS — G4733 Obstructive sleep apnea (adult) (pediatric): Secondary | ICD-10-CM | POA: Diagnosis not present

## 2015-09-20 DIAGNOSIS — Z9989 Dependence on other enabling machines and devices: Secondary | ICD-10-CM

## 2015-09-20 NOTE — Progress Notes (Signed)
Electrophysiology Office Note   Date:  09/20/2015   ID:  Ashley White, DOB 08-24-1950, MRN [id number]  PCP:  Lynne Logan, MD  Cardiologist:  Dr Wynonia Lawman Primary Electrophysiologist: Thompson Grayer, MD    Chief Complaint  Patient presents with  . Atrial Fibrillation     History of Present Illness: Ashley White is a 65 y.o. female who presents today for electrophysiology evaluation.   The patient reports initially being diagnosed with atrial fibrillation in 2015 after presenting to Dr Wynonia Lawman for evaluation of palpitations.  She wore an monitor which documented atrial fibrillation as the cause in August 2017.  She had a stroke 12/25/15.  She was initiated on eliquis.  She was placed on metoprolol.  She continues to have episodes of tachycardia.  She was then placed on flecainide 50mg  BID around October 2016.  She continued to have episodes of atrial fibrillation.  08/30/15 she was evaluated by Dr Wynonia Lawman and was placed on flecainide 100mg  BID.   She has done well without any further episodes of atrial fibrillation.She is tolerating this dose without side effects.  Today, she denies symptoms of palpitations, chest pain, shortness of breath, orthopnea, PND, lower extremity edema, claudication, dizziness, presyncope, syncope, bleeding, or neurologic sequela. The patient is tolerating medications without difficulties and is otherwise without complaint today.    Past Medical History  Diagnosis Date  . Cancer [facility]) 2001    left breast, treated with lumpectomy, radiation, and chemotherapy  . Mitral valve prolapse   . Sleep apnea     uses CPAP followed by Dr Maxwell Caul  . Depression   . ADD (attention deficit disorder)   . Stroke ([place]) 2015  . Paroxysmal atrial fibrillation (HCC)   . 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 Oophorectomy Left   . Right knee tibia surgery      2006  . Breast surgery  1993    left cyst removed  . Breast lumpectomy with  radioactive seed localization Right 08/09/2013    benign  . Cataract extraction    . Breast lumpectomy  2001    malignancy removed     Current Outpatient Prescriptions  Medication Sig Dispense Refill  . apixaban (ELIQUIS) 5 MG TABS tablet Take 1 tablet (5 mg total) by mouth 2 (two) times daily. 60 tablet 2  . atorvastatin (LIPITOR) 40 MG tablet Take 1 tablet (40 mg total) by mouth daily at 6 PM. 30 tablet 2  . cetirizine (ZYRTEC) 10 MG tablet Take 10 mg by mouth at bedtime.     . flecainide (TAMBOCOR) 100 MG tablet Take 1 tablet by mouth 2 (two) times daily.  12  . lamoTRIgine (LAMICTAL) 100 MG tablet Take 100 mg by mouth at bedtime.     . metoprolol succinate (TOPROL-XL) 25 MG 24 hr tablet Take 25 mg by mouth daily with lunch.     . Omega-3 Fatty Acids (FISH OIL) 1000 MG CAPS Take 1,000 mg by mouth at bedtime.     [name] PARoxetine (PAXIL) 20 MG tablet Take 20 mg by mouth at bedtime.      No current facility-administered medications for this visit.    Allergies:   Penicillins   Social History:  The patient  reports that she has never smoked. She has never used smokeless tobacco. She reports that she drinks alcohol. She reports that she does not use illicit drugs.   Family History:  The patient's  family history includes Cancer in her father and paternal grandmother; Heart disease in her mother.    ROS:  Please see the history of present illness.   All other systems are reviewed and negative.    PHYSICAL EXAM: VS:  BP 124/66 mmHg  Pulse 84  Ht 5\' 6"  (1.676 m)  Wt 165 lb (74.844 kg)  BMI 26.64 kg/m2 , BMI Body mass index is 26.64 kg/(m^2). GEN: Well nourished, well developed, in no acute distress HEENT: normal Neck: no JVD, carotid bruits, or masses Cardiac: RRR; no murmurs, rubs, or gallops,no edema  Respiratory:  clear to auscultation bilaterally, normal work of breathing GI: soft, nontender, nondistended, + BS MS: no deformity or atrophy Skin: warm and dry  Neuro:  Strength  and sensation are intact Psych: euthymic mood, full affect  EKG:  EKG is ordered today. The ekg ordered today shows sinus rhythm with PACs  Lipid Panel     Component Value Date/Time   CHOL 186 12/25/2013 0251   TRIG 77 12/25/2013 0251   HDL 51 12/25/2013 0251   CHOLHDL 3.6 12/25/2013 0251   VLDL 15 12/25/2013 0251   LDLCALC 120* 12/25/2013 0251     Wt Readings from Last 3 Encounters:  09/20/15 165 lb (74.844 kg)  10/27/14 153 lb 6.4 oz (69.582 kg)  04/28/14 153 lb (69.4 kg)      Other studies Reviewed: Additional studies/ records that were reviewed today include: Dr Ezekiel Slocumb office notes, recent event monitor, echo 09/18/14  Review of the above records today demonstrates: preserved EF, normal LA size, mild MR, mild TR   ASSESSMENT AND PLAN:  1.  Paroxysmal atrial fibrillation The patient has symptomatic paroxysmal atrial fibrillation.  She is better controlled currently with recently increased flecainide.  She is appropriately anticoagulated with eliquis (chads2vasc score is 3) given prior stroke. Therapeutic strategies for afib including medicine and ablation were discussed in detail with the patient today. Risk, benefits, and alternatives to EP study and radiofrequency ablation for afib were also discussed in detail today.  At this time, she would favor ongoing medical therapy.  She would like to follow-up in 3 months to see if she continues to have controlled afib.  If not, she would like to consider ablation at that time.  Lifestyle modification discussed at length today.  2. OSA Compliance with CPAP encouraged  3. Prior stroke Will require lifelong anticoagulation  Follow-up:  In 3 months Continue to follow with Dr Wynonia Lawman as scheduled  Current medicines are reviewed at length with the patient today.   The patient does not have concerns regarding her medicines.  The following changes were made today:  none   Signed, Thompson Grayer, MD  09/20/2015 11:25 AM     [place] [address] [place] [id number] [phone] (office) [phone] (fax)

## 2015-09-20 NOTE — Patient Instructions (Signed)

## 2015-12-11 ENCOUNTER — Encounter: Payer: Self-pay | Admitting: *Deleted

## 2015-12-25 ENCOUNTER — Ambulatory Visit (INDEPENDENT_AMBULATORY_CARE_PROVIDER_SITE_OTHER): Payer: BLUE CROSS/BLUE SHIELD | Admitting: Internal Medicine

## 2015-12-25 ENCOUNTER — Encounter: Payer: Self-pay | Admitting: Internal Medicine

## 2015-12-25 VITALS — BP 118/70 | HR 68 | Ht 66.0 in | Wt 164.8 lb

## 2015-12-25 DIAGNOSIS — G4733 Obstructive sleep apnea (adult) (pediatric): Secondary | ICD-10-CM

## 2015-12-25 DIAGNOSIS — I480 Paroxysmal atrial fibrillation: Secondary | ICD-10-CM | POA: Diagnosis not present

## 2015-12-25 LAB — CBC WITH DIFFERENTIAL/PLATELET
BASOS PCT: 0 %
Basophils Absolute: 0 cells/uL (ref 0–200)
Eosinophils Absolute: 207 cells/uL (ref 15–500)
Eosinophils Relative: 3 %
HCT: 43.5 % (ref 35.0–45.0)
HEMOGLOBIN: 14.4 g/dL (ref 11.7–15.5)
LYMPHS PCT: 27 %
Lymphs Abs: 1863 cells/uL (ref 850–3900)
MCH: 30.3 pg (ref 27.0–33.0)
MCHC: 33.1 g/dL (ref 32.0–36.0)
MCV: 91.6 fL (ref 80.0–100.0)
MONO ABS: 759 {cells}/uL (ref 200–950)
MPV: 11.2 fL (ref 7.5–12.5)
Monocytes Relative: 11 %
NEUTROS PCT: 59 %
Neutro Abs: 4071 cells/uL (ref 1500–7800)
Platelets: 254 10*3/uL (ref 140–400)
RBC: 4.75 MIL/uL (ref 3.80–5.10)
RDW: 13.5 % (ref 11.0–15.0)
WBC: 6.9 10*3/uL (ref 3.8–10.8)

## 2015-12-25 LAB — BASIC METABOLIC PANEL
BUN: 17 mg/dL (ref 7–25)
CHLORIDE: 105 mmol/L (ref 98–110)
CO2: 31 mmol/L (ref 20–31)
Calcium: 9.6 mg/dL (ref 8.6–10.4)
Creat: 0.82 mg/dL (ref 0.50–0.99)
GLUCOSE: 62 mg/dL — AB (ref 65–99)
Potassium: 4.3 mmol/L (ref 3.5–5.3)
SODIUM: 143 mmol/L (ref 135–146)

## 2015-12-25 NOTE — Patient Instructions (Signed)
Medication Instructions:  Your physician recommends that you continue on your current medications as directed. Please refer to the Current Medication list given to you today.   Labwork: Your physician recommends that you return for lab work today:BMP/CBC   Testing/Procedures: Your physician has requested that you have cardiac CT. Cardiac computed tomography (CT) is a painless test that uses an x-ray machine to take clear, detailed pictures of your heart. For further information please visit [facility]. Please follow instruction sheet as given.---office will arrange and call you with date time and instructions, week of 01/01/16   Your physician has recommended that you have an ablation. Catheter ablation is a medical procedure used to treat some cardiac arrhythmias (irregular heartbeats). During catheter ablation, a long, thin, flexible tube is put into a blood vessel in your groin (upper thigh), or neck. This tube is called an ablation catheter. It is then guided to your heart through the blood vessel. Radio frequency waves destroy small areas of heart tissue where abnormal heartbeats may cause an arrhythmia to start. Please see the instruction sheet given to you today.---01/09/16   Please check in at [facility] at 8:30am Do not eat or drink after midnight the night before your procedure Do not take any medications the morning of the procedure Plan for one night stay    Follow-Up:  Your physician recommends that you schedule a follow-up appointment in: 4 weeks from 01/09/16 with Roderic Palau, NP and 3 months from 10/10 with Dr Rayann Heman

## 2015-12-25 NOTE — Progress Notes (Signed)
Electrophysiology Office Note   Date:  12/25/2015   ID:  Ashley White, DOB 1950-05-15, MRN [id number]  PCP:  Ashley Logan, MD  Cardiologist:  Dr Wynonia Lawman Primary Electrophysiologist: Thompson Grayer, MD    Chief Complaint  Patient presents with  . Atrial Fibrillation     History of Present Illness: Ashley White is a 65 y.o. female who presents today for electrophysiology evaluation.   The patient reports initially being diagnosed with atrial fibrillation in 2015 after presenting to Dr Wynonia Lawman for evaluation of palpitations.  She wore an monitor which documented atrial fibrillation as the cause in August 2015.  She had a stroke 12/24/13.  She was initiated on eliquis.  She was placed on metoprolol.  She continues to have episodes of symptomatic afib.  She continues to have episodes despite medical therapy with flecainide.  She reports having fatigue with her afib. Today, she denies symptoms of palpitations, chest pain, shortness of breath, orthopnea, PND, lower extremity edema, claudication, dizziness, presyncope, syncope, bleeding, or neurologic sequela. The patient is tolerating medications without difficulties and is otherwise without complaint today.    Past Medical History:  Diagnosis Date  . ADD (attention deficit disorder)   . Cancer [facility]) 2001   left breast, treated with lumpectomy, radiation, and chemotherapy  . Depression   . Mitral valve prolapse   . 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 Paroxysmal atrial fibrillation (HCC)   . Sleep apnea    uses CPAP followed by Dr Maxwell Caul  . Stroke [facility]) 2015   Past Surgical History:  Procedure Laterality Date  . BREAST LUMPECTOMY  2001   malignancy removed  . BREAST LUMPECTOMY WITH RADIOACTIVE SEED LOCALIZATION Right 08/09/2013   benign  . BREAST SURGERY  1993   left cyst removed  . CATARACT EXTRACTION    . OOPHORECTOMY Left   . right knee tibia surgery     2006     Current Outpatient Prescriptions  Medication Sig Dispense Refill    . apixaban (ELIQUIS) 5 MG TABS tablet Take 1 tablet (5 mg total) by mouth 2 (two) times daily. 60 tablet 2  . cetirizine (ZYRTEC) 10 MG tablet Take 10 mg by mouth at bedtime.     . flecainide (TAMBOCOR) 100 MG tablet Take 1 tablet by mouth 2 (two) times daily.  12  . lamoTRIgine (LAMICTAL) 200 MG tablet Take 1 tablet by mouth at bedtime.  5  . metoprolol succinate (TOPROL-XL) 25 MG 24 hr tablet Take 25 mg by mouth daily with breakfast.     . Omega-3 Fatty Acids (FISH OIL) 1000 MG CAPS Take 1,000 mg by mouth at bedtime.     Ashley White PARoxetine (PAXIL) 20 MG tablet Take 20 mg by mouth at bedtime.      No current facility-administered medications for this visit.     Allergies:   Penicillins   Social History:  The patient  reports that she has never smoked. She has never used smokeless tobacco. She reports that she drinks alcohol. She reports that she does not use drugs.   Family History:  The patient's  family history includes Breast cancer in her paternal grandmother; Colon cancer in her father; Heart disease in her mother.    ROS:  Please see the history of present illness.   All other systems are reviewed and negative.    PHYSICAL EXAM: VS:  BP 118/70   Pulse 68   Ht 5\' 6"  (1.676 m)   Wt  164 lb 12.8 oz (74.8 kg)   BMI 26.60 kg/m  , BMI Body mass index is 26.6 kg/m. GEN: Well nourished, well developed, in no acute distress  HEENT: normal  Neck: no JVD, carotid bruits, or masses Cardiac: RRR; no murmurs, rubs, or gallops,no edema  Respiratory:  clear to auscultation bilaterally, normal work of breathing GI: soft, nontender, nondistended, + BS MS: no deformity or atrophy  Skin: warm and dry  Neuro:  Strength and sensation are intact Psych: euthymic mood, full affect  EKG:  EKG is ordered today. The ekg ordered today shows sinus rhythm with biatrial enlargement  Lipid Panel     Component Value Date/Time   CHOL 186 12/25/2013 0251   TRIG 77 12/25/2013 0251   HDL 51 12/25/2013  0251   CHOLHDL 3.6 12/25/2013 0251   VLDL 15 12/25/2013 0251   LDLCALC 120 (H) 12/25/2013 0251     Wt Readings from Last 3 Encounters:  12/25/15 164 lb 12.8 oz (74.8 kg)  09/20/15 165 lb (74.8 kg)  10/27/14 153 lb 6.4 oz (69.6 kg)      Other studies: echo 09/18/14  reveals preserved EF, normal LA size, mild MR, mild TR   ASSESSMENT AND PLAN:  1.  Paroxysmal atrial fibrillation The patient has symptomatic paroxysmal atrial fibrillation.  She has failed medical therapy with flecainide.  She is appropriately anticoagulated with eliquis (chads2vasc score is 3) given prior stroke. Therapeutic strategies for afib including medicine and ablation were discussed in detail with the patient today. Risk, benefits, and alternatives to EP study and radiofrequency ablation for afib were also discussed in detail today. These risks include but are not limited to stroke, bleeding, vascular damage, tamponade, perforation, damage to the esophagus, lungs, and other structures, pulmonary vein stenosis, worsening renal function, and death. The patient understands these risk and wishes to proceed.  We will therefore proceed with catheter ablation at the next available time.  Will obtain cardiac CT within 1 week of ablation.  2. OSA Compliance with CPAP encouraged  3. Prior stroke Will require lifelong anticoagulation  Signed, Thompson Grayer, MD  12/25/2015 10:25 AM     [place] [id number] [phone] (office) [phone] (fax)

## 2015-12-26 ENCOUNTER — Encounter: Payer: Self-pay | Admitting: Internal Medicine

## 2016-01-03 ENCOUNTER — Telehealth: Payer: Self-pay | Admitting: Internal Medicine

## 2016-01-03 NOTE — Telephone Encounter (Signed)
New Message  Pt voiced wanting to know if she can take her medications prior to her CT that's scheduled for tomorrow.  Please f/u with pt

## 2016-01-03 NOTE — Telephone Encounter (Signed)
Okay to take medications the morning of the procedure if needed with a small sip of water

## 2016-01-04 ENCOUNTER — Ambulatory Visit (HOSPITAL_COMMUNITY)
Admission: RE | Admit: 2016-01-04 | Discharge: 2016-01-04 | Disposition: A | Discharge status: Home / Self Care | Payer: BLUE CROSS/BLUE SHIELD | Source: Ambulatory Visit | Attending: Internal Medicine | Admitting: Internal Medicine

## 2016-01-04 ENCOUNTER — Encounter (HOSPITAL_COMMUNITY): Payer: Self-pay

## 2016-01-04 DIAGNOSIS — I480 Paroxysmal atrial fibrillation: Secondary | ICD-10-CM | POA: Diagnosis not present

## 2016-01-04 DIAGNOSIS — R911 Solitary pulmonary nodule: Secondary | ICD-10-CM | POA: Insufficient documentation

## 2016-01-04 MED ORDER — IOPAMIDOL (ISOVUE-370) INJECTION 76%
INTRAVENOUS | Status: AC
  Administered 2016-01-04: 80 mL
  Filled 2016-01-04: qty 100

## 2016-01-09 ENCOUNTER — Ambulatory Visit (HOSPITAL_BASED_OUTPATIENT_CLINIC_OR_DEPARTMENT_OTHER): Payer: BLUE CROSS/BLUE SHIELD

## 2016-01-09 ENCOUNTER — Encounter (HOSPITAL_COMMUNITY)
Admission: RE | Disposition: A | Discharge status: Home / Self Care | Payer: Self-pay | Source: Ambulatory Visit | Attending: Internal Medicine

## 2016-01-09 ENCOUNTER — Ambulatory Visit (HOSPITAL_COMMUNITY): Payer: BLUE CROSS/BLUE SHIELD | Admitting: Certified Registered Nurse Anesthetist

## 2016-01-09 ENCOUNTER — Ambulatory Visit (HOSPITAL_COMMUNITY)
Admission: RE | Admit: 2016-01-09 | Discharge: 2016-01-10 | Disposition: A | Discharge status: Home / Self Care | Payer: BLUE CROSS/BLUE SHIELD | Source: Ambulatory Visit | Attending: Internal Medicine | Admitting: Internal Medicine

## 2016-01-09 DIAGNOSIS — I059 Rheumatic mitral valve disease, unspecified: Secondary | ICD-10-CM | POA: Diagnosis present

## 2016-01-09 DIAGNOSIS — I480 Paroxysmal atrial fibrillation: Secondary | ICD-10-CM | POA: Insufficient documentation

## 2016-01-09 DIAGNOSIS — Z853 Personal history of malignant neoplasm of breast: Secondary | ICD-10-CM | POA: Diagnosis not present

## 2016-01-09 DIAGNOSIS — Z79899 Other long term (current) drug therapy: Secondary | ICD-10-CM | POA: Diagnosis not present

## 2016-01-09 DIAGNOSIS — Z880 Allergy status to penicillin: Secondary | ICD-10-CM | POA: Insufficient documentation

## 2016-01-09 DIAGNOSIS — Z7901 Long term (current) use of anticoagulants: Secondary | ICD-10-CM | POA: Diagnosis not present

## 2016-01-09 DIAGNOSIS — F329 Major depressive disorder, single episode, unspecified: Secondary | ICD-10-CM | POA: Insufficient documentation

## 2016-01-09 DIAGNOSIS — I4891 Unspecified atrial fibrillation: Secondary | ICD-10-CM

## 2016-01-09 DIAGNOSIS — Z8673 Personal history of transient ischemic attack (TIA), and cerebral infarction without residual deficits: Secondary | ICD-10-CM | POA: Insufficient documentation

## 2016-01-09 DIAGNOSIS — I483 Typical atrial flutter: Secondary | ICD-10-CM | POA: Insufficient documentation

## 2016-01-09 DIAGNOSIS — Z923 Personal history of irradiation: Secondary | ICD-10-CM | POA: Diagnosis not present

## 2016-01-09 DIAGNOSIS — G4733 Obstructive sleep apnea (adult) (pediatric): Secondary | ICD-10-CM | POA: Insufficient documentation

## 2016-01-09 DIAGNOSIS — Z9221 Personal history of antineoplastic chemotherapy: Secondary | ICD-10-CM | POA: Diagnosis not present

## 2016-01-09 DIAGNOSIS — I341 Nonrheumatic mitral (valve) prolapse: Secondary | ICD-10-CM | POA: Diagnosis not present

## 2016-01-09 DIAGNOSIS — G4730 Sleep apnea, unspecified: Secondary | ICD-10-CM | POA: Diagnosis not present

## 2016-01-09 HISTORY — PX: ELECTROPHYSIOLOGIC STUDY: SHX172A

## 2016-01-09 LAB — MRSA PCR SCREENING: MRSA by PCR: NEGATIVE

## 2016-01-09 LAB — POCT ACTIVATED CLOTTING TIME
ACTIVATED CLOTTING TIME: 307 s
ACTIVATED CLOTTING TIME: 345 s
ACTIVATED CLOTTING TIME: 202 s
ACTIVATED CLOTTING TIME: 175 s
ACTIVATED CLOTTING TIME: 197 s

## 2016-01-09 LAB — GLUCOSE, CAPILLARY: Glucose-Capillary: 92 mg/dL (ref 65–99)

## 2016-01-09 SURGERY — ATRIAL FIBRILLATION ABLATION
Anesthesia: General

## 2016-01-09 MED ORDER — PAROXETINE HCL 20 MG PO TABS
20.0000 mg | ORAL_TABLET | Freq: Every day | ORAL | Status: DC
  Administered 2016-01-09: 20 mg via ORAL
  Filled 2016-01-09: qty 1

## 2016-01-09 MED ORDER — HEPARIN SODIUM (PORCINE) 1000 UNIT/ML IJ SOLN
INTRAMUSCULAR | Status: DC | PRN
  Administered 2016-01-09: 12000 [IU] via INTRAVENOUS
  Administered 2016-01-09: 3000 [IU] via INTRAVENOUS

## 2016-01-09 MED ORDER — PROPOFOL 500 MG/50ML IV EMUL
INTRAVENOUS | Status: DC | PRN
  Administered 2016-01-09: 80 ug/kg/min via INTRAVENOUS

## 2016-01-09 MED ORDER — ISOPROTERENOL HCL 0.2 MG/ML IJ SOLN
INTRAMUSCULAR | Status: AC
  Filled 2016-01-09: qty 5

## 2016-01-09 MED ORDER — IOPAMIDOL (ISOVUE-370) INJECTION 76%
INTRAVENOUS | Status: AC
  Filled 2016-01-09: qty 50

## 2016-01-09 MED ORDER — ISOPROTERENOL HCL 0.2 MG/ML IJ SOLN
INTRAVENOUS | Status: DC | PRN
  Administered 2016-01-09: 20 ug/min via INTRAVENOUS

## 2016-01-09 MED ORDER — HYDROCODONE-ACETAMINOPHEN 5-325 MG PO TABS: 1.0000 | ORAL_TABLET | ORAL | Status: DC | PRN

## 2016-01-09 MED ORDER — PHENYLEPHRINE HCL 10 MG/ML IJ SOLN
INTRAVENOUS | Status: DC | PRN
  Administered 2016-01-09: 20 ug/min via INTRAVENOUS

## 2016-01-09 MED ORDER — BUPIVACAINE HCL (PF) 0.25 % IJ SOLN
INTRAMUSCULAR | Status: DC | PRN
  Administered 2016-01-09: 30 mL

## 2016-01-09 MED ORDER — LIDOCAINE 2% (20 MG/ML) 5 ML SYRINGE
INTRAMUSCULAR | Status: DC | PRN
  Administered 2016-01-09: 80 mg via INTRAVENOUS

## 2016-01-09 MED ORDER — ACETAMINOPHEN 325 MG PO TABS: 650.0000 mg | ORAL_TABLET | ORAL | Status: DC | PRN

## 2016-01-09 MED ORDER — ONDANSETRON HCL 4 MG/2ML IJ SOLN
INTRAMUSCULAR | Status: DC | PRN
  Administered 2016-01-09 (×2): 4 mg via INTRAVENOUS

## 2016-01-09 MED ORDER — DEXAMETHASONE SODIUM PHOSPHATE 10 MG/ML IJ SOLN
INTRAMUSCULAR | Status: DC | PRN
  Administered 2016-01-09: 10 mg via INTRAVENOUS

## 2016-01-09 MED ORDER — BUPIVACAINE HCL (PF) 0.25 % IJ SOLN
INTRAMUSCULAR | Status: AC
  Filled 2016-01-09: qty 30

## 2016-01-09 MED ORDER — IOPAMIDOL (ISOVUE-370) INJECTION 76%
INTRAVENOUS | Status: DC | PRN
  Administered 2016-01-09: 3 mL via INTRAVENOUS

## 2016-01-09 MED ORDER — MIDAZOLAM HCL 5 MG/5ML IJ SOLN
INTRAMUSCULAR | Status: DC | PRN
  Administered 2016-01-09: 2 mg via INTRAVENOUS

## 2016-01-09 MED ORDER — EPHEDRINE SULFATE 50 MG/ML IJ SOLN
INTRAMUSCULAR | Status: DC | PRN
  Administered 2016-01-09: 10 mg via INTRAVENOUS

## 2016-01-09 MED ORDER — FLECAINIDE ACETATE 100 MG PO TABS
100.0000 mg | ORAL_TABLET | Freq: Two times a day (BID) | ORAL | Status: DC
  Administered 2016-01-10: 100 mg via ORAL
  Filled 2016-01-09: qty 1

## 2016-01-09 MED ORDER — LAMOTRIGINE 200 MG PO TABS
200.0000 mg | ORAL_TABLET | Freq: Every day | ORAL | Status: DC
  Administered 2016-01-09: 200 mg via ORAL
  Filled 2016-01-09: qty 1

## 2016-01-09 MED ORDER — SODIUM CHLORIDE 0.9 % IV SOLN
250.0000 mL | INTRAVENOUS | Status: DC | PRN
Start: 2016-01-09 — End: 2016-01-10

## 2016-01-09 MED ORDER — APIXABAN 5 MG PO TABS
5.0000 mg | ORAL_TABLET | Freq: Two times a day (BID) | ORAL | Status: DC
  Administered 2016-01-09 – 2016-01-10 (×2): 5 mg via ORAL
  Filled 2016-01-09 (×2): qty 1

## 2016-01-09 MED ORDER — ONDANSETRON HCL 4 MG/2ML IJ SOLN: 4.0000 mg | Freq: Four times a day (QID) | INTRAMUSCULAR | Status: DC | PRN

## 2016-01-09 MED ORDER — PROPOFOL 10 MG/ML IV BOLUS
INTRAVENOUS | Status: DC | PRN
  Administered 2016-01-09: 50 mg via INTRAVENOUS
  Administered 2016-01-09: 40 mg via INTRAVENOUS
  Administered 2016-01-09: 30 mg via INTRAVENOUS

## 2016-01-09 MED ORDER — SODIUM CHLORIDE 0.9 % IV SOLN
INTRAVENOUS | Status: DC
  Administered 2016-01-09: 10:00:00 via INTRAVENOUS

## 2016-01-09 MED ORDER — PHENYLEPHRINE HCL 10 MG/ML IJ SOLN
INTRAMUSCULAR | Status: DC | PRN
  Administered 2016-01-09 (×2): 80 ug via INTRAVENOUS

## 2016-01-09 MED ORDER — SODIUM CHLORIDE 0.9% FLUSH: 3.0000 mL | INTRAVENOUS | Status: DC | PRN

## 2016-01-09 MED ORDER — HEPARIN SODIUM (PORCINE) 1000 UNIT/ML IJ SOLN
INTRAMUSCULAR | Status: AC
  Filled 2016-01-09: qty 1

## 2016-01-09 MED ORDER — FENTANYL CITRATE (PF) 100 MCG/2ML IJ SOLN
INTRAMUSCULAR | Status: DC | PRN
  Administered 2016-01-09: 25 ug via INTRAVENOUS
  Administered 2016-01-09: 50 ug via INTRAVENOUS
  Administered 2016-01-09 (×2): 25 ug via INTRAVENOUS

## 2016-01-09 MED ORDER — METOPROLOL SUCCINATE ER 25 MG PO TB24
25.0000 mg | ORAL_TABLET | Freq: Every day | ORAL | Status: DC
Start: 2016-01-10 — End: 2016-01-10
  Administered 2016-01-10: 25 mg via ORAL
  Filled 2016-01-09: qty 1

## 2016-01-09 MED ORDER — HEPARIN SODIUM (PORCINE) 1000 UNIT/ML IJ SOLN
INTRAMUSCULAR | Status: DC | PRN
  Administered 2016-01-09 (×2): 1000 [IU] via INTRAVENOUS

## 2016-01-09 MED ORDER — LACTATED RINGERS IV SOLN
INTRAVENOUS | Status: DC | PRN
  Administered 2016-01-09: 15:00:00 via INTRAVENOUS

## 2016-01-09 MED ORDER — PROTAMINE SULFATE 10 MG/ML IV SOLN
INTRAVENOUS | Status: DC | PRN
  Administered 2016-01-09: 10 mg via INTRAVENOUS

## 2016-01-09 MED ORDER — SODIUM CHLORIDE 0.9% FLUSH
3.0000 mL | Freq: Two times a day (BID) | INTRAVENOUS | Status: DC
  Administered 2016-01-09: 3 mL via INTRAVENOUS

## 2016-01-09 SURGICAL SUPPLY — 18 items
BAG SNAP BAND KOVER 36X36 (MISCELLANEOUS) ×2 IMPLANT
BLANKET WARM UNDERBOD FULL ACC (MISCELLANEOUS) ×2 IMPLANT
CATH NAVISTAR SMARTTOUCH DF (ABLATOR) ×2 IMPLANT
CATH SOUNDSTAR 3D IMAGING (CATHETERS) ×2 IMPLANT
CATH VARIABLE LASSO NAV 2515 (CATHETERS) ×2 IMPLANT
CATH WEBSTER BI DIR CS D-F CRV (CATHETERS) ×2 IMPLANT
COVER SWIFTLINK CONNECTOR (BAG) ×2 IMPLANT
NEEDLE TRANSEP BRK 71CM 407200 (NEEDLE) ×2 IMPLANT
PACK EP LATEX FREE (CUSTOM PROCEDURE TRAY) ×1
PACK EP LF (CUSTOM PROCEDURE TRAY) ×1 IMPLANT
PAD DEFIB LIFELINK (PAD) ×2 IMPLANT
PATCH CARTO3 (PAD) ×2 IMPLANT
SHEATH AVANTI 11F 11CM (SHEATH) ×2 IMPLANT
SHEATH PINNACLE 7F 10CM (SHEATH) ×4 IMPLANT
SHEATH PINNACLE 9F 10CM (SHEATH) ×2 IMPLANT
SHEATH SWARTZ TS SL2 63CM 8.5F (SHEATH) ×2 IMPLANT
SHIELD RADPAD SCOOP 12X17 (MISCELLANEOUS) ×2 IMPLANT
TUBING SMART ABLATE COOLFLOW (TUBING) ×2 IMPLANT

## 2016-01-09 NOTE — Anesthesia Procedure Notes (Signed)
Procedure Name: LMA Insertion Date/Time: 01/09/2016 12:32 PM Performed by: Mervyn Gay Pre-anesthesia Checklist: Patient identified, Emergency Drugs available, Suction available, Patient being monitored and Timeout performed Patient Re-evaluated:Patient Re-evaluated prior to inductionOxygen Delivery Method: Circle system utilized Preoxygenation: Pre-oxygenation with 100% oxygen Intubation Type: IV induction LMA: LMA inserted LMA Size: 4.0 Number of attempts: 1 Placement Confirmation: positive ETCO2 and breath sounds checked- equal and bilateral Tube secured with: Tape Dental Injury: Teeth and Oropharynx as per pre-operative assessment

## 2016-01-09 NOTE — CV Procedure (Signed)
    TRANSESOPHAGEAL ECHOCARDIOGRAM (TEE) NOTE  INDICATIONS: atrial fibrillation  PROCEDURE:   Informed consent was obtained prior to the procedure. The risks, benefits and alternatives for the procedure were discussed and the patient comprehended these risks.  Risks include, but are not limited to, cough, sore throat, vomiting, nausea, somnolence, esophageal and stomach trauma or perforation, bleeding, low blood pressure, aspiration, pneumonia, infection, trauma to the teeth and death.    After a procedural time-out, the patient was sedated by anesthesia with propofol.  The patient's heart rate, blood pressure, and oxygen saturation are monitored continuously during the procedure.The transesophageal probe was inserted in the esophagus and stomach without difficulty and multiple views were obtained.  The patient was kept under observation pending an atrial fibrillation ablation procedure.   Agitated microbubble saline contrast was not administered.  COMPLICATIONS:    There were no immediate complications.  Findings:  1. LEFT VENTRICLE: The left ventricular wall thickness is mildly increased.  The left ventricular cavity is normal in size. Wall motion is normal.  LVEF is 60-65%.  2. RIGHT VENTRICLE:  The right ventricle is normal in structure and function without any thrombus or masses.    3. LEFT ATRIUM:  The left atrium is moderately dilated in size without any thrombus or masses.  There is not spontaneous echo contrast ("smoke") in the left atrium consistent with a low flow state.  4. LEFT ATRIAL APPENDAGE:  The left atrial appendage is free of any thrombus or masses. It has a chickenwing morphology. The appendage has single lobes. Pulse doppler indicates moderate flow in the appendage.  5. ATRIAL SEPTUM:  The atrial septum appears intact and is free of thrombus and/or masses.  There is no evidence for interatrial shunting by color doppler.  6. RIGHT ATRIUM:  The right atrium is  normal in size and function without any thrombus or masses.  7. MITRAL VALVE:  The mitral valve demonstrates mild late systolic bowing of the posterior leaflet with trace to mild regurgitation.  There were no vegetations or stenosis.  8. AORTIC VALVE:  The aortic valve is trileaflet, normal in structure and function with no regurgitation.  There were no vegetations or stenosis  9. TRICUSPID VALVE:  The tricuspid valve is normal in structure and function with Mild regurgitation. RVSP is calculated to be 28 mmHg + RAP. There were no vegetations or stenosis  10.  PULMONIC VALVE:  The pulmonic valve is normal in structure and function with no regurgitation.  There were no vegetations or stenosis.   11. AORTIC ARCH, ASCENDING AND DESCENDING AORTA:  There was no Ron Parker et. Al, 1992) atherosclerosis of the ascending aorta, aortic arch, or proximal descending aorta.  12. PULMONARY VEINS: Anomalous pulmonary venous return was not noted.  13. PERICARDIUM: The pericardium appeared normal and non-thickened.  There is no pericardial effusion.  IMPRESSION:   1. No LAA thrombus 2. Negative for PFO by color doppler 3. Trace to mild MR with late systolic bowing of the posterior mitral leaflet 4. Moderate LAE 5. LVEF 60-65% with normal wall motion  RECOMMENDATIONS:    1.  Proceed with a-fib ablation. No evidence for valvular cause of left atrial enlargement.  Time Spent Directly with the Patient:  30 minutes   Pixie Casino, MD, [facility] Attending Cardiologist [facility]  01/09/2016, 12:28 PM

## 2016-01-09 NOTE — H&P (View-Only) (Signed)
Electrophysiology Office Note   Date:  12/25/2015   ID:  Ashley White, DOB [birth year], MRN [id number]  PCP:  Lynne Logan, MD  Cardiologist:  Dr Wynonia Lawman Primary Electrophysiologist: Thompson Grayer, MD    Chief Complaint  Patient presents with  . Atrial Fibrillation     History of Present Illness: Ashley White is a 65 y.o. female who presents today for electrophysiology evaluation.   The patient reports initially being diagnosed with atrial fibrillation in 2015 after presenting to Dr Wynonia Lawman for evaluation of palpitations.  She wore an monitor which documented atrial fibrillation as the cause in August 2015.  She had a stroke 12/24/13.  She was initiated on eliquis.  She was placed on metoprolol.  She continues to have episodes of symptomatic afib.  She continues to have episodes despite medical therapy with flecainide.  She reports having fatigue with her afib. Today, she denies symptoms of palpitations, chest pain, shortness of breath, orthopnea, PND, lower extremity edema, claudication, dizziness, presyncope, syncope, bleeding, or neurologic sequela. The patient is tolerating medications without difficulties and is otherwise without complaint today.    Past Medical History:  Diagnosis Date  . ADD (attention deficit disorder)   . Cancer [facility]) 2001   left breast, treated with lumpectomy, radiation, and chemotherapy  . Depression   . Mitral valve prolapse   . 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 Paroxysmal atrial fibrillation (HCC)   . Sleep apnea    uses CPAP followed by Dr Maxwell Caul  . Stroke [facility]) 2015   Past Surgical History:  Procedure Laterality Date  . BREAST LUMPECTOMY  2001   malignancy removed  . BREAST LUMPECTOMY WITH RADIOACTIVE SEED LOCALIZATION Right 08/09/2013   benign  . BREAST SURGERY  1993   left cyst removed  . CATARACT EXTRACTION    . OOPHORECTOMY Left   . right knee tibia surgery     2006     Current Outpatient Prescriptions  Medication Sig Dispense Refill    . apixaban (ELIQUIS) 5 MG TABS tablet Take 1 tablet (5 mg total) by mouth 2 (two) times daily. 60 tablet 2  . cetirizine (ZYRTEC) 10 MG tablet Take 10 mg by mouth at bedtime.     . flecainide (TAMBOCOR) 100 MG tablet Take 1 tablet by mouth 2 (two) times daily.  12  . lamoTRIgine (LAMICTAL) 200 MG tablet Take 1 tablet by mouth at bedtime.  5  . metoprolol succinate (TOPROL-XL) 25 MG 24 hr tablet Take 25 mg by mouth daily with breakfast.     . Omega-3 Fatty Acids (FISH OIL) 1000 MG CAPS Take 1,000 mg by mouth at bedtime.     [name] PARoxetine (PAXIL) 20 MG tablet Take 20 mg by mouth at bedtime.      No current facility-administered medications for this visit.     Allergies:   Penicillins   Social History:  The patient  reports that she has never smoked. She has never used smokeless tobacco. She reports that she drinks alcohol. She reports that she does not use drugs.   Family History:  The patient's  family history includes Breast cancer in her paternal grandmother; Colon cancer in her father; Heart disease in her mother.    ROS:  Please see the history of present illness.   All other systems are reviewed and negative.    PHYSICAL EXAM: VS:  BP 118/70   Pulse 68   Ht 5\' 6"  (1.676 m)   Wt  164 lb 12.8 oz (74.8 kg)   BMI 26.60 kg/m  , BMI Body mass index is 26.6 kg/m. GEN: Well nourished, well developed, in no acute distress  HEENT: normal  Neck: no JVD, carotid bruits, or masses Cardiac: RRR; no murmurs, rubs, or gallops,no edema  Respiratory:  clear to auscultation bilaterally, normal work of breathing GI: soft, nontender, nondistended, + BS MS: no deformity or atrophy  Skin: warm and dry  Neuro:  Strength and sensation are intact Psych: euthymic mood, full affect  EKG:  EKG is ordered today. The ekg ordered today shows sinus rhythm with biatrial enlargement  Lipid Panel     Component Value Date/Time   CHOL 186 12/25/2013 0251   TRIG 77 12/25/2013 0251   HDL 51 12/25/2013  0251   CHOLHDL 3.6 12/25/2013 0251   VLDL 15 12/25/2013 0251   LDLCALC 120 (H) 12/25/2013 0251     Wt Readings from Last 3 Encounters:  12/25/15 164 lb 12.8 oz (74.8 kg)  09/20/15 165 lb (74.8 kg)  10/27/14 153 lb 6.4 oz (69.6 kg)      Other studies: echo 09/18/14  reveals preserved EF, normal LA size, mild MR, mild TR   ASSESSMENT AND PLAN:  1.  Paroxysmal atrial fibrillation The patient has symptomatic paroxysmal atrial fibrillation.  She has failed medical therapy with flecainide.  She is appropriately anticoagulated with eliquis (chads2vasc score is 3) given prior stroke. Therapeutic strategies for afib including medicine and ablation were discussed in detail with the patient today. Risk, benefits, and alternatives to EP study and radiofrequency ablation for afib were also discussed in detail today. These risks include but are not limited to stroke, bleeding, vascular damage, tamponade, perforation, damage to the esophagus, lungs, and other structures, pulmonary vein stenosis, worsening renal function, and death. The patient understands these risk and wishes to proceed.  We will therefore proceed with catheter ablation at the next available time.  Will obtain cardiac CT within 1 week of ablation.  2. OSA Compliance with CPAP encouraged  3. Prior stroke Will require lifelong anticoagulation  Signed, Thompson Grayer, MD  12/25/2015 10:25 AM     [place] [place] [address] [phone] (office) [phone] (fax)

## 2016-01-09 NOTE — Transfer of Care (Signed)
Immediate Anesthesia Transfer of Care Note  Patient: Ashley White  Procedure(s) Performed: Procedure(s): Atrial Fibrillation Ablation (N/A)  Patient Location: Cath Lab  Anesthesia Type:General  Level of Consciousness: awake, alert  and oriented  Airway & Oxygen Therapy: Patient Spontanous Breathing and Patient connected to nasal cannula oxygen  Post-op Assessment: Report given to RN, Post -op Vital signs reviewed and stable and Patient moving all extremities  Post vital signs: Reviewed and stable  Last Vitals:  Vitals:   01/09/16 0846 01/09/16 1534  BP: 131/79   Pulse: 70   Resp: 18   Temp: 36.9 C 36.2 C    Last Pain:  Vitals:   01/09/16 0846  TempSrc: Oral      Patients Stated Pain Goal: 3 ([id number])  Complications: No apparent anesthesia complications

## 2016-01-09 NOTE — Discharge Summary (Signed)
Dictation #1 [id number]  [id number]    ELECTROPHYSIOLOGY PROCEDURE DISCHARGE SUMMARY    Patient ID: Ashley White,  MRN: [id number], DOB/AGE: 08-21-1950 65 y.o.  Admit date: 01/09/2016 Discharge date: 01/10/16  Primary Care Physician: Lynne Logan, MD  Primary Cardiologist: Dr. Wynonia Lawman Electrophysiologist: Thompson Grayer, MD  Primary Discharge Diagnosis:  1. Paroxysmal AFib     CHA2DS2Vasc is at least 4, on Eliquis  Secondary Discharge Diagnosis:  1. OSA     Reports compliance with CPAP    Procedures This Admission:  1.  Electrophysiology study and radiofrequency catheter ablation on 01/09/16 by Dr Thompson Grayer.  This study demonstrated: CONCLUSIONS: 1. Atrial fibrillation presentation.   2. Intracardiac echo reveals a moderate sized left atrium with four separate pulmonary veins without evidence of pulmonary vein stenosis. 3. Successful electrical isolation and anatomical encircling of all four pulmonary veins with radiofrequency current.   AF converted to sinus rhythm with isolation of the LSPV 4. Cavo-tricuspid isthmus ablation was performed with complete bidirectional isthmus block achieved.  5. No inducible arrhythmias following ablation both on and off of Isuprel 6. No early apparent complications.  Brief HPI: Ashley White is a 65 y.o. female with a history of paroxysmal atrial fibrillation.  They have failed medical therapy with flecainide. Risks, benefits, and alternatives to catheter ablation of atrial fibrillation were reviewed with the patient who wished to proceed.  The patient underwent TEE prior to the procedure which demonstrated normal LV function and no LAA thrombus.    Hospital Course:  The patient was admitted and underwent EPS/RFCA of atrial fibrillation with details as outlined above.  They were monitored on telemetry overnight which demonstrated SR, 70's.  Groin was without complication on the day of discharge.  The patient was examined by Dr. Rayann Heman and  considered to be stable for discharge.  Wound care and restrictions were reviewed with the patient.  The patient will be seen back by Roderic Palau, NP in 4 weeks and Dr Rayann Heman in 12 weeks for post ablation follow up.    Physical Exam: Vitals:   01/10/16 0348 01/10/16 0500 01/10/16 0800 01/10/16 0815  BP:  (!) 105/50 111/68 111/68  Pulse: 71   79  Resp: 15  (!) 21 (!) 21  Temp: 98.2 F (36.8 C)   98 F (36.7 C)  TempSrc: Oral   Oral  SpO2: 94%   95%  Weight:      Height:        GEN- The patient is well appearing, alert and oriented x 3 today.   HEENT: normocephalic, atraumatic; sclera clear, conjunctiva pink; hearing intact; oropharynx clear; neck supple  Lungs- Clear to ausculation bilaterally, normal work of breathing.  No wheezes, rales, rhonchi Heart- Regular rate and rhythm, no murmurs, rubs or gallops  GI- soft, non-tender, non-distended, bowel sounds present  Extremities- no clubbing, cyanosis, or edema; groin without hematoma/bruit MS- no significant deformity or atrophy Skin- warm and dry, no rash or lesion Psych- euthymic mood, full affect Neuro- strength and sensation are intact   Labs:   Lab Results  Component Value Date   WBC 6.9 12/25/2015   HGB 14.4 12/25/2015   HCT 43.5 12/25/2015   MCV 91.6 12/25/2015   PLT 254 12/25/2015   No results for input(s): NA, K, CL, CO2, BUN, CREATININE, CALCIUM, PROT, BILITOT, ALKPHOS, ALT, AST, GLUCOSE in the last 168 hours.  Invalid input(s): LABALBU   Discharge Medications:    Medication List    TAKE these medications  apixaban 5 MG Tabs tablet Commonly known as:  ELIQUIS Take 1 tablet (5 mg total) by mouth 2 (two) times daily.   cetirizine 10 MG tablet Commonly known as:  ZYRTEC Take 10 mg by mouth at bedtime.   Fish Oil 1000 MG Caps Take 1,000 mg by mouth at bedtime.   flecainide 100 MG tablet Commonly known as:  TAMBOCOR Take 1 tablet by mouth 2 (two) times daily.   lamoTRIgine 200 MG tablet Commonly  known as:  LAMICTAL Take 1 tablet by mouth at bedtime.   metoprolol succinate 25 MG 24 hr tablet Commonly known as:  TOPROL-XL Take 25 mg by mouth daily with breakfast.   pantoprazole 40 MG tablet Commonly known as:  PROTONIX Take 1 tablet (40 mg total) by mouth daily.   PARoxetine 20 MG tablet Commonly known as:  PAXIL Take 20 mg by mouth at bedtime.       Disposition:  Home Discharge Instructions    Diet - low sodium heart healthy    Complete by:  As directed    Increase activity slowly    Complete by:  As directed      Follow-up Information    [facility] Follow up on 02/09/2016.   Specialty:  Cardiology Why:  10:00AM Contact information: [address] [id number] [place] [phone]          Duration of Discharge Encounter: Greater than 30 minutes including physician time.  Signed, Tommye Standard, PA-C 01/10/2016 8:50 AM   I have seen, examined the patient, and reviewed the above assessment and plan.  Changes to above are made where necessary.  On exam, RRR.  Resume home medicines.  Routine post procedure follow-up.  Co Sign: Thompson Grayer, MD 01/10/2016 8:50 AM

## 2016-01-09 NOTE — Discharge Instructions (Signed)
No driving for 1 week. No lifting over 5 lbs for 1 week. No vigorous or  sexual activity for 1 week. You may return to work on 01/16/16. Keep procedure site clean & dry. If you notice increased pain, swelling, bleeding or pus, call/return!  You may shower, but no soaking baths/hot tubs/pools for 1 week.      You have an appointment set up with the [facility].  Multiple studies have shown that being followed by a dedicated atrial fibrillation clinic in addition to the standard care you receive from your other physicians improves health. We believe that enrollment in the atrial fibrillation clinic will allow [place] to better care for you.   The phone number to the [facility] is [phone]. The clinic is staffed Monday through Friday from 8:30am to 5pm.  Parking Directions: The clinic is located in the Heart and Vascular Building connected to [facility]. 1)From [address] turn on to [address] and go to the 3rd entrance  (Heart and Vascular entrance) on the right. 2)Look to the right for Heart &Vascular Parking Garage. 3)A code for the entrance is required please call the clinic to receive this.   4)Take the elevators to the 1st floor. Registration is in the room with the glass walls at the end of the hallway.  If you have any trouble parking or locating the clinic, please dont hesitate to call [phone].

## 2016-01-09 NOTE — Anesthesia Preprocedure Evaluation (Addendum)
Anesthesia Evaluation  Patient identified by MRN, date of birth, ID band Patient awake    Reviewed: Allergy & Precautions, NPO status , Patient's Chart, lab work & pertinent test results  History of Anesthesia Complications Negative for: history of anesthetic complications  Airway Mallampati: I   Neck ROM: Full    Dental  (+) Teeth Intact, Caps,    Pulmonary sleep apnea and Continuous Positive Airway Pressure Ventilation ,    breath sounds clear to auscultation       Cardiovascular Exercise Tolerance: Good + Peripheral Vascular Disease  + dysrhythmias Atrial Fibrillation  Rhythm:Regular Rate:Normal     Neuro/Psych PSYCHIATRIC DISORDERS Depression CVA, No Residual Symptoms    GI/Hepatic negative GI ROS, Neg liver ROS,   Endo/Other  negative endocrine ROS  Renal/GU negative Renal ROS     Musculoskeletal negative musculoskeletal ROS (+)   Abdominal (+) + obese,  Abdomen: soft. Bowel sounds: normal.  Peds  Hematology negative hematology ROS (+)   Anesthesia Other Findings   Reproductive/Obstetrics negative OB ROS                           BP Readings from Last 3 Encounters:  01/09/16 131/79  01/04/16 101/61  12/25/15 118/70   Lab Results  Component Value Date   WBC 6.9 12/25/2015   HGB 14.4 12/25/2015   HCT 43.5 12/25/2015   MCV 91.6 12/25/2015   PLT 254 12/25/2015     Chemistry      Component Value Date/Time   NA 143 12/25/2015 1055   K 4.3 12/25/2015 1055   CL 105 12/25/2015 1055   CO2 31 12/25/2015 1055   BUN 17 12/25/2015 1055   CREATININE 0.82 12/25/2015 1055      Component Value Date/Time   CALCIUM 9.6 12/25/2015 1055   ALKPHOS 77 12/24/2013 2009   AST 26 12/24/2013 2009   ALT 23 12/24/2013 2009   BILITOT 0.2 (L) 12/24/2013 2009      Anesthesia Physical Anesthesia Plan  ASA: III  Anesthesia Plan: General   Post-op Pain Management:    Induction:  Intravenous  Airway Management Planned: LMA  Additional Equipment:   Intra-op Plan:   Post-operative Plan:   Informed Consent: I have reviewed the patients History and Physical, chart, labs and discussed the procedure including the risks, benefits and alternatives for the proposed anesthesia with the patient or authorized representative who has indicated his/her understanding and acceptance.     Plan Discussed with: CRNA and Anesthesiologist  Anesthesia Plan Comments:         Anesthesia Quick Evaluation

## 2016-01-09 NOTE — Interval H&P Note (Signed)
History and Physical Interval Note:  01/09/2016 11:25 AM  Ashley White  has presented today for surgery, with the diagnosis of afib  The various methods of treatment have been discussed with the patient and family. After consideration of risks, benefits and other options for treatment, the patient has consented to  Procedure(s): Atrial Fibrillation Ablation (N/A) as a surgical intervention .  The patient's history has been reviewed, patient examined, no change in status, stable for surgery.  I have reviewed the patient's chart and labs.  Questions were answered to the patient's satisfaction.     Cardiac CT is reviewed with patient.  Given severe LAE, I worry that success with ablation is reduced.  I spoke with Dr Wynonia Lawman.  The patient has not had recent 2D echo.  I will therefore plan preoperative TEE today to evaluate prior miltral valve prolapse and MR and other causes for LAE.   Thompson Grayer

## 2016-01-09 NOTE — Progress Notes (Signed)
Site area: Right  Groin a 7, 9, 11 venous sheath was removed  Site Prior to Removal:  Level 0  Pressure Applied For 15 MINUTES    Bedrest Beginning at 1740p  Manual:   Yes.    Patient Status During Pull:  stable  Post Pull Groin Site:  Level 0  Post Pull Instructions Given:  Yes.    Post Pull Pulses Present:  Yes.    Dressing Applied:  Yes.    Comments:  VS remain stable during sheath pull

## 2016-01-09 NOTE — H&P (Signed)
    INTERVAL PROCEDURE H&P  History and Physical Interval Note:  01/09/2016 12:03 PM  Minus Liberty Nease has presented today for their planned procedure. The various methods of treatment have been discussed with the patient and family. After consideration of risks, benefits and other options for treatment, the patient has consented to the procedure.  The patients' outpatient history has been reviewed, patient examined, and no change in status from most recent office note within the past 30 days. I have reviewed the patients' chart and labs and will proceed as planned. Questions were answered to the patient's satisfaction.   Pixie Casino, MD, [facility] Attending Cardiologist [place] 01/09/2016, 12:03 PM

## 2016-01-09 NOTE — Progress Notes (Signed)
*  PRELIMINARY RESULTS* Echocardiogram TEE has been performed.  Leavy Cella 01/09/2016, 12:56 PM

## 2016-01-10 ENCOUNTER — Encounter (HOSPITAL_COMMUNITY): Payer: Self-pay | Admitting: *Deleted

## 2016-01-10 DIAGNOSIS — Z8673 Personal history of transient ischemic attack (TIA), and cerebral infarction without residual deficits: Secondary | ICD-10-CM | POA: Diagnosis not present

## 2016-01-10 DIAGNOSIS — I480 Paroxysmal atrial fibrillation: Secondary | ICD-10-CM | POA: Diagnosis not present

## 2016-01-10 DIAGNOSIS — I481 Persistent atrial fibrillation: Secondary | ICD-10-CM | POA: Diagnosis not present

## 2016-01-10 DIAGNOSIS — I341 Nonrheumatic mitral (valve) prolapse: Secondary | ICD-10-CM | POA: Diagnosis not present

## 2016-01-10 DIAGNOSIS — I483 Typical atrial flutter: Secondary | ICD-10-CM | POA: Diagnosis not present

## 2016-01-10 MED ORDER — PHENOL 1.4 % MT LIQD: 1.0000 | OROMUCOSAL | Status: DC | PRN

## 2016-01-10 MED ORDER — PANTOPRAZOLE SODIUM 40 MG PO TBEC: 40.0000 mg | DELAYED_RELEASE_TABLET | Freq: Every day | ORAL | 0 refills | Status: DC

## 2016-01-10 NOTE — Care Management Note (Signed)
Case Management Note  Patient Details  Name: Ashley White MRN: [id number] Date of Birth: Dec 28, 1950  Subjective/Objective:    Afib, ablation                Action/Plan: Discharge Planning: AVS reviewed:  Chart reviewed. OIB. Has coverage for medications. PCP  [name] MD   Expected Discharge Date:  01/10/2016               Expected Discharge Plan:  Home/Self Care  In-House Referral:  NA  Discharge planning Services  CM Consult  Post Acute Care Choice:  NA Choice offered to:  NA  DME Arranged:  N/A DME Agency:  NA  HH Arranged:  NA HH Agency:  NA  Status of Service:  Completed, signed off  If discussed at [place] of Stay Meetings, dates discussed:    Additional Comments:  Erenest Rasher, RN 01/10/2016, 9:48 AM

## 2016-01-10 NOTE — Progress Notes (Signed)
DC instructions reviewed with patient and husband at bedside, PIV DC'd, central tele notified, no further questions from patient or husband. Volunteer services called to take patient out.  Rowe Pavy, RN

## 2016-01-11 NOTE — Anesthesia Postprocedure Evaluation (Signed)
Anesthesia Post Note  Patient: Ashley White  Procedure(s) Performed: Procedure(s) (LRB): Atrial Fibrillation Ablation (N/A)  Patient location during evaluation: PACU Anesthesia Type: General Level of consciousness: awake and alert Pain management: pain level controlled Vital Signs Assessment: post-procedure vital signs reviewed and stable Respiratory status: spontaneous breathing, nonlabored ventilation, respiratory function stable and patient connected to nasal cannula oxygen Cardiovascular status: blood pressure returned to baseline and stable Postop Assessment: no signs of nausea or vomiting Anesthetic complications: no    Last Vitals:  Vitals:   01/10/16 0800 01/10/16 0815  BP: 111/68 111/68  Pulse:  79  Resp: (!) 21 (!) 21  Temp:  36.7 C    Last Pain:  Vitals:   01/10/16 0815  TempSrc: Oral                 Akaya Proffit,JAMES TERRILL

## 2016-02-09 ENCOUNTER — Encounter (HOSPITAL_COMMUNITY): Payer: Self-pay | Admitting: Nurse Practitioner

## 2016-02-09 ENCOUNTER — Ambulatory Visit (HOSPITAL_COMMUNITY)
Admission: RE | Admit: 2016-02-09 | Discharge: 2016-02-09 | Disposition: A | Discharge status: Home / Self Care | Payer: BLUE CROSS/BLUE SHIELD | Source: Ambulatory Visit | Attending: Nurse Practitioner | Admitting: Nurse Practitioner

## 2016-02-09 VITALS — BP 128/78 | HR 60 | Ht 66.0 in | Wt 164.0 lb

## 2016-02-09 DIAGNOSIS — I480 Paroxysmal atrial fibrillation: Secondary | ICD-10-CM | POA: Diagnosis present

## 2016-02-09 DIAGNOSIS — Z853 Personal history of malignant neoplasm of breast: Secondary | ICD-10-CM | POA: Diagnosis not present

## 2016-02-09 DIAGNOSIS — F329 Major depressive disorder, single episode, unspecified: Secondary | ICD-10-CM | POA: Insufficient documentation

## 2016-02-09 DIAGNOSIS — G4730 Sleep apnea, unspecified: Secondary | ICD-10-CM | POA: Insufficient documentation

## 2016-02-09 DIAGNOSIS — Z8673 Personal history of transient ischemic attack (TIA), and cerebral infarction without residual deficits: Secondary | ICD-10-CM | POA: Diagnosis not present

## 2016-02-09 DIAGNOSIS — I341 Nonrheumatic mitral (valve) prolapse: Secondary | ICD-10-CM | POA: Diagnosis not present

## 2016-02-09 DIAGNOSIS — Z880 Allergy status to penicillin: Secondary | ICD-10-CM | POA: Diagnosis not present

## 2016-02-09 DIAGNOSIS — Z79899 Other long term (current) drug therapy: Secondary | ICD-10-CM | POA: Insufficient documentation

## 2016-02-09 DIAGNOSIS — F988 Other specified behavioral and emotional disorders with onset usually occurring in childhood and adolescence: Secondary | ICD-10-CM | POA: Diagnosis not present

## 2016-02-09 DIAGNOSIS — Z7901 Long term (current) use of anticoagulants: Secondary | ICD-10-CM | POA: Insufficient documentation

## 2016-02-09 NOTE — Progress Notes (Signed)
Primary Care Physician: Lynne Logan, MD Referring Physician: Dr. Marvia Pickles Ashley White is a 65 y.o. female with a h/o paroxysmal afib that is in the afib clinic for f/u of ablation 10/31. She continues on flecainide 100 mg bid and has not missed any doses of eliquis 5 mg bid. No groin or swallowing issues. Wears cpap. She did have some afib in the first one to two weeks but this has resolved.  Today, she denies symptoms of palpitations, chest pain, shortness of breath, orthopnea, PND, lower extremity edema, dizziness, presyncope, syncope, or neurologic sequela. The patient is tolerating medications without difficulties and is otherwise without complaint today.   Past Medical History:  Diagnosis Date  . ADD (attention deficit disorder)   . Cancer [facility]) 2001   left breast, treated with lumpectomy, radiation, and chemotherapy  . Depression   . Mitral valve prolapse   . 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 Paroxysmal atrial fibrillation (HCC)   . Sleep apnea    uses CPAP followed by Dr Maxwell Caul  . Stroke [facility]) 2015   Past Surgical History:  Procedure Laterality Date  . BREAST LUMPECTOMY  2001   malignancy removed  . BREAST LUMPECTOMY WITH RADIOACTIVE SEED LOCALIZATION Right 08/09/2013   benign  . BREAST SURGERY  1993   left cyst removed  . CATARACT EXTRACTION    . ELECTROPHYSIOLOGIC STUDY N/A 01/09/2016   Procedure: Atrial Fibrillation Ablation;  Surgeon: Thompson Grayer, MD;  Location: [facility];  Service: Cardiovascular;  Laterality: N/A;  . OOPHORECTOMY Left   . right knee tibia surgery     2006    Current Outpatient Prescriptions  Medication Sig Dispense Refill  . apixaban (ELIQUIS) 5 MG TABS tablet Take 1 tablet (5 mg total) by mouth 2 (two) times daily. 60 tablet 2  . cetirizine (ZYRTEC) 10 MG tablet Take 10 mg by mouth at bedtime.     . flecainide (TAMBOCOR) 100 MG tablet Take 1 tablet by mouth 2 (two) times daily.  12  . lamoTRIgine (LAMICTAL) 200 MG tablet Take  1 tablet by mouth at bedtime.  5  . metoprolol succinate (TOPROL-XL) 25 MG 24 hr tablet Take 25 mg by mouth daily with breakfast.     . Omega-3 Fatty Acids (FISH OIL) 1000 MG CAPS Take 1,000 mg by mouth at bedtime.     . pantoprazole (PROTONIX) 40 MG tablet Take 1 tablet (40 mg total) by mouth daily. 45 tablet 0  . PARoxetine (PAXIL) 20 MG tablet Take 20 mg by mouth at bedtime.     . pravastatin (PRAVACHOL) 40 MG tablet 1 tablet every other day.  12   No current facility-administered medications for this encounter.     Allergies  Allergen Reactions  . Penicillins Other (See Comments)    Childhood allergic reaction (unknown)    Social History   Social History  . Marital status: Married    Spouse name: N/A  . Number of children: 0  . Years of education: Bachelor's   Occupational History  . Retired    Social History Main Topics  . Smoking status: Never Smoker  . Smokeless tobacco: Never Used  . Alcohol use 0.0 oz/week     Comment: 1-2 times a week (2 beers), sometimes more  . Drug use: No     Comment: tried crack in 1980's and marijuana as teen  . Sexual activity: Yes   Other Topics Concern  . Not  on file   Social History Narrative   Patient is married.   Patient has a college education.   Patient is left handed.   Patient drinks 1.5 cups of caffeine daily.   Lives in [place].  Retired       Family History  Problem Relation Age of Onset  . Heart disease Mother   . Colon cancer Father   . Breast cancer Paternal Grandmother     ROS- All systems are reviewed and negative except as per the HPI above  Physical Exam: Vitals:   02/09/16 1007  BP: 128/78  Pulse: 60  Weight: 164 lb (74.4 kg)  Height: 5\' 6"  (1.676 m)    GEN- The patient is well appearing, alert and oriented x 3 today.   Head- normocephalic, atraumatic Eyes-  Sclera clear, conjunctiva pink Ears- hearing intact Oropharynx- clear Neck- supple, no JVP Lymph- no cervical  lymphadenopathy Lungs- Clear to ausculation bilaterally, normal work of breathing Heart- Regular rate and rhythm, no murmurs, rubs or gallops, PMI not laterally displaced GI- soft, NT, ND, + BS Extremities- no clubbing, cyanosis, or edema MS- no significant deformity or atrophy Skin- no rash or lesion Psych- euthymic mood, full affect Neuro- strength and sensation are intact  EKG-NSR at 60 bpm, Pr int 184 ms, qrs int 84 ms, qtc 448 ms Epic records reviewed  Assessment and Plan: 1. Paroxysmal afib  Doing well in SR s/p ablation Continue flecainide 100 mg bid Continue eliquis 5 mg bid Continue cpap Reminded no more alcohol than 2 drinks a week  Butch Penny C. Carroll, [facility] [address] [place], [place] [address] [phone]

## 2016-02-21 ENCOUNTER — Other Ambulatory Visit: Payer: Self-pay | Admitting: Physician Assistant

## 2016-03-01 ENCOUNTER — Encounter: Payer: Self-pay | Admitting: Physician Assistant

## 2016-03-11 ENCOUNTER — Ambulatory Visit: Payer: BLUE CROSS/BLUE SHIELD | Admitting: Physician Assistant

## 2016-03-28 ENCOUNTER — Ambulatory Visit (INDEPENDENT_AMBULATORY_CARE_PROVIDER_SITE_OTHER): Payer: BLUE CROSS/BLUE SHIELD | Admitting: Physician Assistant

## 2016-03-28 VITALS — BP 122/76 | HR 56 | Ht 66.0 in | Wt 162.0 lb

## 2016-03-28 DIAGNOSIS — Z5181 Encounter for therapeutic drug level monitoring: Secondary | ICD-10-CM | POA: Diagnosis not present

## 2016-03-28 DIAGNOSIS — I480 Paroxysmal atrial fibrillation: Secondary | ICD-10-CM | POA: Diagnosis not present

## 2016-03-28 NOTE — Progress Notes (Signed)
Cardiology Office Note Date:  03/28/2016  Patient ID:  Ashley White, DOB 1950/04/27, MRN [id number] PCP:  Lynne Logan, MD  Cardiologist:  Dr. Wynonia Lawman Electrophysiologist: Dr. Rayann Heman   Chief Complaint: post-ablation follow up  History of Present Illness: Ashley White is a 65 y.o. female with history of paroxysmal AFib/typical AFlutter, stroke, OSA comes to the office to be seen for Dr. Rayann Heman s/p ablation 01/09/16.  She saw the AF clinic with some post-procedure palpitations though had resolved by that visit.  She is feeling very well.  Her energy is much better, no CP or SOB, no dizziness, near syncope or syncope.  She has good exercise tolerance and is sleeping well.  The week after her ablation she had one day she had about an hour of AF, since then it has been no more then a skipped beat infrequently.  She is extremely happy with her outcome.  She denies any procedure site complication.  She denies any bleeding or signs of bleeding with her Eliquis, reminded not to miss any doses.   AFib hx: Dx 2015 Failed Flecainide with recurrent AF AFib/PVI/flutter ablation 01/09/16 Dr. Rayann Heman   Past Medical History:  Diagnosis Date  . ADD (attention deficit disorder)   . Cancer [facility]) 2001   left breast, treated with lumpectomy, radiation, and chemotherapy  . Depression   . Mitral valve prolapse   . 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 Paroxysmal atrial fibrillation (HCC)   . Sleep apnea    uses CPAP followed by Dr Maxwell Caul  . Stroke [facility]) 2015    Past Surgical History:  Procedure Laterality Date  . BREAST LUMPECTOMY  2001   malignancy removed  . BREAST LUMPECTOMY WITH RADIOACTIVE SEED LOCALIZATION Right 08/09/2013   benign  . BREAST SURGERY  1993   left cyst removed  . CATARACT EXTRACTION    . ELECTROPHYSIOLOGIC STUDY N/A 01/09/2016   Procedure: Atrial Fibrillation Ablation;  Surgeon: Thompson Grayer, MD;  Location: [place] [facility];  Service: Cardiovascular;  Laterality: N/A;  .  OOPHORECTOMY Left   . right knee tibia surgery     2006    Current Outpatient Prescriptions  Medication Sig Dispense Refill  . apixaban (ELIQUIS) 5 MG TABS tablet Take 1 tablet (5 mg total) by mouth 2 (two) times daily. 60 tablet 2  . cetirizine (ZYRTEC) 10 MG tablet Take 10 mg by mouth at bedtime.     . flecainide (TAMBOCOR) 100 MG tablet Take 1 tablet by mouth 2 (two) times daily.  12  . lamoTRIgine (LAMICTAL) 200 MG tablet Take 1 tablet by mouth at bedtime.  5  . metoprolol succinate (TOPROL-XL) 25 MG 24 hr tablet Take 25 mg by mouth daily with breakfast.     . Omega-3 Fatty Acids (FISH OIL) 1000 MG CAPS Take 1,000 mg by mouth at bedtime.     [name] PARoxetine (PAXIL) 20 MG tablet Take 20 mg by mouth at bedtime.     . pravastatin (PRAVACHOL) 40 MG tablet 1 tablet every other day.  12   No current facility-administered medications for this visit.     Allergies:   Penicillins   Social History:  The patient  reports that she has never smoked. She has never used smokeless tobacco. She reports that she drinks alcohol. She reports that she does not use drugs.   Family History:  The patient's family history includes Breast cancer in her paternal grandmother; Colon cancer in her father; Heart disease  in her mother.  ROS:  Please see the history of present illness.   All other systems are reviewed and otherwise negative.   PHYSICAL EXAM:  VS:  BP 122/76   Pulse (!) 56   Ht 5\' 6"  (1.676 m)   Wt 162 lb (73.5 kg)   BMI 26.15 kg/m  BMI: Body mass index is 26.15 kg/m. Well nourished, well developed, in no acute distress  HEENT: normocephalic, atraumatic  Neck: no JVD, carotid bruits or masses Cardiac:  RRR; no significant murmurs, no rubs, or gallops Lungs:  clear to auscultation bilaterally, no wheezing, rhonchi or rales  Abd: soft, nontender MS: no deformity or atrophy Ext: no edema  Skin: warm and dry, no rash Neuro:  No gross deficits appreciated Psych: euthymic mood, full  affect     EKG:  Done today and reviewed by myself is SB, 56bpm, PR 167ms, QRS 94ms, QTc 469ms  01/09/16: Ablation, Dr. Rayann Heman  CONCLUSIONS: 1. Atrial fibrillation presentation.   2. Intracardiac echo reveals a moderate sized left atrium with four separate pulmonary veins without evidence of pulmonary vein stenosis. 3. Successful electrical isolation and anatomical encircling of all four pulmonary veins with radiofrequency current.   AF converted to sinus rhythm with isolation of the LSPV 4. Cavo-tricuspid isthmus ablation was performed with complete bidirectional isthmus block achieved.  5. No inducible arrhythmias following ablation both on and off of Isuprel 6. No early apparent complications.  01/09/16: TEE Study Conclusions - Left ventricle: There was mild concentric hypertrophy. Systolic   function was normal. The estimated ejection fraction was in the   range of 60% to 65%. - Mitral valve: Mildly thickened leaflets . Systolic bowing without   prolapse. There was trivial regurgitation. - Left atrium: The atrium was dilated. No evidence of thrombus in   the atrial cavity or appendage. - Right atrium: No evidence of thrombus in the atrial cavity or   appendage. - Atrial septum: No defect or patent foramen ovale was identified. - Tricuspid valve: There was mild regurgitation. Peak RV-RA   gradient (S): 24 mm Hg. - Pulmonic valve: No evidence of vegetation. Impressions: - Negative for mitral stenosis. No LAA thrombus. Negative for PFO.   Recent Labs: 12/25/2015: BUN 17; Creat 0.82; Hemoglobin 14.4; Platelets 254; Potassium 4.3; Sodium 143  No results found for requested labs within last 8760 hours.   CrCl cannot be calculated (Patient's most recent lab result is older than the maximum 21 days allowed.).   Wt Readings from Last 3 Encounters:  03/28/16 162 lb (73.5 kg)  02/09/16 164 lb (74.4 kg)  01/09/16 166 lb 7.2 oz (75.5 kg)     Other studies reviewed: Additional  studies/records reviewed today include: summarized above  ASSESSMENT AND PLAN:  1. Paroxysmal AFib, typical AFlutter     Ablation 01/09/16     CHA2DS2Vasc is at least 4 on Eliquis (lifelong)     On Flecainide/Toprol  We discussed the possibility of stopping her Flecainide, she is somewhat hesitant though, remarks that she felt so poorly with her AF and feels so well now, mentions that Dr. Rayann Heman discussed concerns about the size or shape of her heart chamber to some degree reducing the potential success of her ablation to begin with.  Will make no changes, EKG looks OK, she feels very well.  We will see her back in 3 months to discuss further with Dr. Rayann Heman her AAD tx going forward.      Current medicines are reviewed at length  with the patient today.  The patient did not have any concerns regarding medicines.  Haywood Lasso, PA-C 03/28/2016 4:07 PM     [place] [address] [place] [address] [phone] (office)  [phone] (fax)

## 2016-03-28 NOTE — Patient Instructions (Signed)
Medication Instructions:   Your physician recommends that you continue on your current medications as directed. Please refer to the Current Medication list given to you today.   If you need a refill on your cardiac medications before your next appointment, please call your pharmacy.  Labwork: NONE ORDERED  TODAY    Testing/Procedures: NONE ORDERED  TODAY    Follow-Up: IN 3 MONTHS WITH DR ALLRED    Any Other Special Instructions Will Be Listed Below (If Applicable).                                                                                                                                                   

## 2016-06-13 ENCOUNTER — Other Ambulatory Visit: Payer: Self-pay | Admitting: Family Medicine

## 2016-06-13 ENCOUNTER — Ambulatory Visit
Admission: RE | Admit: 2016-06-13 | Discharge: 2016-06-13 | Disposition: A | Discharge status: Home / Self Care | Payer: BLUE CROSS/BLUE SHIELD | Source: Ambulatory Visit | Attending: Family Medicine | Admitting: Family Medicine

## 2016-06-13 DIAGNOSIS — R52 Pain, unspecified: Secondary | ICD-10-CM

## 2016-06-26 ENCOUNTER — Ambulatory Visit (INDEPENDENT_AMBULATORY_CARE_PROVIDER_SITE_OTHER): Payer: BLUE CROSS/BLUE SHIELD | Admitting: Internal Medicine

## 2016-06-26 VITALS — BP 134/70 | HR 66 | Ht 66.0 in | Wt 158.4 lb

## 2016-06-26 DIAGNOSIS — G4733 Obstructive sleep apnea (adult) (pediatric): Secondary | ICD-10-CM

## 2016-06-26 DIAGNOSIS — Z9989 Dependence on other enabling machines and devices: Secondary | ICD-10-CM | POA: Diagnosis not present

## 2016-06-26 DIAGNOSIS — I480 Paroxysmal atrial fibrillation: Secondary | ICD-10-CM | POA: Diagnosis not present

## 2016-06-26 MED ORDER — FLECAINIDE ACETATE 100 MG PO TABS: 50.0000 mg | ORAL_TABLET | Freq: Two times a day (BID) | ORAL | 12 refills | Status: DC

## 2016-06-26 NOTE — Patient Instructions (Signed)
Medication Instructions:  Your physician has recommended you make the following change in your medication:  1) Decrease Flecainide to 50 mg twice daily for 4 weeks then stop   Labwork: None ordered   Testing/Procedures: None ordered   Follow-Up: Your physician recommends that you schedule a follow-up appointment in: 3 months with Dr Rayann Heman   Any Other Special Instructions Will Be Listed Below (If Applicable).     If you need a refill on your cardiac medications before your next appointment, please call your pharmacy.

## 2016-06-26 NOTE — Progress Notes (Signed)
Electrophysiology Office Note   Date:  06/26/2016   ID:  Ashley White, DOB 03-Jun-1950, MRN [id number]  PCP:  Lynne Logan, MD  Cardiologist:  Dr Wynonia Lawman Primary Electrophysiologist: Thompson Grayer, MD    Chief Complaint  Patient presents with  . Atrial Fibrillation     History of Present Illness: Ashley White is a 66 y.o. female who presents today for electrophysiology evaluation.  She has done very well since her afib ablation.  Denies further AF or procedure related complications.  Today, she denies symptoms of palpitations, chest pain, shortness of breath, orthopnea, PND, lower extremity edema, claudication, dizziness, presyncope, syncope, bleeding, or neurologic sequela. The patient is tolerating medications without difficulties and is otherwise without complaint today.    Past Medical History:  Diagnosis Date  . ADD (attention deficit disorder)   . Cancer [facility]) 2001   left breast, treated with lumpectomy, radiation, and chemotherapy  . Depression   . Mitral valve prolapse   . Myocarditis ([place])    at age 64  . Paroxysmal atrial fibrillation (HCC)   . Sleep apnea    uses CPAP followed by Dr Maxwell Caul  . Stroke [facility]) 2015   Past Surgical History:  Procedure Laterality Date  . BREAST LUMPECTOMY  2001   malignancy removed  . BREAST LUMPECTOMY WITH RADIOACTIVE SEED LOCALIZATION Right 08/09/2013   benign  . BREAST SURGERY  1993   left cyst removed  . CATARACT EXTRACTION    . ELECTROPHYSIOLOGIC STUDY N/A 01/09/2016   Procedure: Atrial Fibrillation Ablation;  Surgeon: Thompson Grayer, MD;  Location: [place] [facility];  Service: Cardiovascular;  Laterality: N/A;  . OOPHORECTOMY Left   . right knee tibia surgery     2006     Current Outpatient Prescriptions  Medication Sig Dispense Refill  . apixaban (ELIQUIS) 5 MG TABS tablet Take 1 tablet (5 mg total) by mouth 2 (two) times daily. 60 tablet 2  . cetirizine (ZYRTEC) 10 MG tablet Take 10 mg by mouth at bedtime.     .  flecainide (TAMBOCOR) 100 MG tablet Take 1 tablet by mouth 2 (two) times daily.  12  . lamoTRIgine (LAMICTAL) 200 MG tablet Take 1 tablet by mouth at bedtime.  5  . metoprolol succinate (TOPROL-XL) 25 MG 24 hr tablet Take 25 mg by mouth daily with breakfast.     . Omega-3 Fatty Acids (FISH OIL) 1000 MG CAPS Take 1,000 mg by mouth at bedtime.     [name] PARoxetine (PAXIL) 20 MG tablet Take 20 mg by mouth at bedtime.     . pravastatin (PRAVACHOL) 40 MG tablet 1 tablet every other day.  12   No current facility-administered medications for this visit.     Allergies:   Penicillins   Social History:  The patient  reports that she has never smoked. She has never used smokeless tobacco. She reports that she drinks alcohol. She reports that she does not use drugs.   Family History:  The patient's  family history includes Breast cancer in her paternal grandmother; Colon cancer in her father; Heart disease in her mother.    ROS:  Please see the history of present illness.   All other systems are reviewed and negative.    PHYSICAL EXAM: VS:  BP 134/70   Pulse 66   Ht 5\' 6"  (1.676 m)   Wt 158 lb 6 oz (71.8 kg)   SpO2 98%   BMI 25.56 kg/m  , BMI Body mass index is  25.56 kg/m. GEN: Well nourished, well developed, in no acute distress  HEENT: normal  Neck: no JVD, carotid bruits, or masses Cardiac: RRR,no edema  Respiratory:  clear to auscultation bilaterally, normal work of breathing GI: soft, nontender, nondistended, + BS MS: no deformity or atrophy  Skin: warm and dry  Neuro:  Strength and sensation are intact Psych: euthymic mood, full affect  EKG:  EKG is ordered today. The ekg ordered today shows sinus rhythm 66 bpm, otherwise normal ekg  Lipid Panel     Component Value Date/Time   CHOL 186 12/25/2013 0251   TRIG 77 12/25/2013 0251   HDL 51 12/25/2013 0251   CHOLHDL 3.6 12/25/2013 0251   VLDL 15 12/25/2013 0251   LDLCALC 120 (H) 12/25/2013 0251     Wt Readings from Last 3  Encounters:  06/26/16 158 lb 6 oz (71.8 kg)  03/28/16 162 lb (73.5 kg)  02/09/16 164 lb (74.4 kg)     Other studies: echo 09/18/14  reveals preserved EF, normal LA size, mild MR, mild TR   ASSESSMENT AND PLAN:  1.  Paroxysmal atrial fibrillation Doing well s/p ablation Stop flecainide (she wishes to wean off over 4 weeks) Consider stopping metoprolol in the future Given prior stroke, will require long term anticoagulation  2. OSA Compliance with CPAP encouraged  3. Prior stroke Will require lifelong anticoagulation  Follow-up with Dr Wynonia Lawman as scheduled I will see again in 3 months  Signed, Thompson Grayer, MD  06/26/2016 3:28 PM     [place] [address] [place] [address] [phone] (office) [phone] (fax)

## 2016-09-09 ENCOUNTER — Encounter: Payer: Self-pay | Admitting: Internal Medicine

## 2016-09-25 ENCOUNTER — Encounter: Payer: Self-pay | Admitting: Internal Medicine

## 2016-09-25 ENCOUNTER — Ambulatory Visit (INDEPENDENT_AMBULATORY_CARE_PROVIDER_SITE_OTHER): Payer: BLUE CROSS/BLUE SHIELD | Admitting: Internal Medicine

## 2016-09-25 VITALS — BP 120/82 | HR 64 | Ht 66.0 in | Wt 158.0 lb

## 2016-09-25 DIAGNOSIS — I480 Paroxysmal atrial fibrillation: Secondary | ICD-10-CM

## 2016-09-25 NOTE — Patient Instructions (Signed)

## 2016-09-25 NOTE — Progress Notes (Signed)
PCP: [name], MD Primary Cardiologist: Dr Donnelly Ashley White is a 66 y.o. female who presents today for routine electrophysiology followup.  Since last being seen in our clinic, the patient reports doing very well. She denies symptoms of afib.  She is very pleased with current state.  He 74 year old granddaughter visited for 2 weeks and they are very active without limitation.  Today, she denies symptoms of palpitations, chest pain, shortness of breath,  lower extremity edema, dizziness, presyncope, or syncope.  The patient is otherwise without complaint today.   Past Medical History:  Diagnosis Date  . ADD (attention deficit disorder)   . Cancer [facility]) 2001   left breast, treated with lumpectomy, radiation, and chemotherapy  . Depression   . Mitral valve prolapse   . Myocarditis ([place])    at age 63  . Paroxysmal atrial fibrillation (HCC)   . Sleep apnea    uses CPAP followed by Dr Maxwell Caul  . Stroke [facility]) 2015   Past Surgical History:  Procedure Laterality Date  . BREAST LUMPECTOMY  2001   malignancy removed  . BREAST LUMPECTOMY WITH RADIOACTIVE SEED LOCALIZATION Right 08/09/2013   benign  . BREAST SURGERY  1993   left cyst removed  . CATARACT EXTRACTION    . ELECTROPHYSIOLOGIC STUDY N/A 01/09/2016   Procedure: Atrial Fibrillation Ablation;  Surgeon: Thompson Grayer, MD;  Location: [place] [facility];  Service: Cardiovascular;  Laterality: N/A;  . OOPHORECTOMY Left   . right knee tibia surgery     2006    ROS- all systems are reviewed and negatives except as per HPI above  Current Outpatient Prescriptions  Medication Sig Dispense Refill  . apixaban (ELIQUIS) 5 MG TABS tablet Take 1 tablet (5 mg total) by mouth 2 (two) times daily. 60 tablet 2  . cetirizine (ZYRTEC) 10 MG tablet Take 10 mg by mouth at bedtime.     . lamoTRIgine (LAMICTAL) 200 MG tablet Take 1 tablet by mouth at bedtime.  5  . metoprolol succinate (TOPROL-XL) 25 MG 24 hr tablet Take 25 mg by mouth daily with  breakfast.     . modafinil (PROVIGIL) 200 MG tablet Take 200 mg by mouth daily.  1  . Omega-3 Fatty Acids (FISH OIL) 1000 MG CAPS Take 1,000 mg by mouth at bedtime.     [name] PARoxetine (PAXIL) 20 MG tablet Take 20 mg by mouth at bedtime.     . pravastatin (PRAVACHOL) 40 MG tablet Take 1 tablet by mouth every other day.   12   No current facility-administered medications for this visit.     Physical Exam: Vitals:   09/25/16 1541  BP: 120/82  Pulse: 64  SpO2: 98%  Weight: 158 lb (71.7 kg)  Height: 5\' 6"  (1.676 m)    GEN- The patient is well appearing, alert and oriented x 3 today.   Head- normocephalic, atraumatic Eyes-  Sclera clear, conjunctiva pink Ears- hearing intact Oropharynx- clear Lungs- Clear to ausculation bilaterally, normal work of breathing Heart- Regular rate and rhythm, no murmurs, rubs or gallops, PMI not laterally displaced GI- soft, NT, ND, + BS Extremities- no clubbing, cyanosis, or edema  EKG tracing ordered today is personally reviewed and shows sinus rhythm 64 bpm, otherwise normal ekg  Assessment and Plan:  1. Paroxysmal atrial fibrillation Maintaining sinus rhythm post ablation off AAD therapy She wishes to continue toprol.  Longterm anticoagulation due to prior stroke  2. OSA Compliant with CPAP  Follow-up with  me in 6 months Follow-up with Dr Wynonia Lawman as scheduled  Thompson Grayer MD, [facility] 09/25/2016 3:49 PM

## 2017-01-31 DIAGNOSIS — Z853 Personal history of malignant neoplasm of breast: Secondary | ICD-10-CM | POA: Diagnosis not present

## 2017-01-31 DIAGNOSIS — Z1231 Encounter for screening mammogram for malignant neoplasm of breast: Secondary | ICD-10-CM | POA: Diagnosis not present

## 2017-02-03 DIAGNOSIS — F908 Attention-deficit hyperactivity disorder, other type: Secondary | ICD-10-CM | POA: Diagnosis not present

## 2017-02-03 DIAGNOSIS — F3181 Bipolar II disorder: Secondary | ICD-10-CM | POA: Diagnosis not present

## 2017-02-10 DIAGNOSIS — F908 Attention-deficit hyperactivity disorder, other type: Secondary | ICD-10-CM | POA: Diagnosis not present

## 2017-02-10 DIAGNOSIS — F3181 Bipolar II disorder: Secondary | ICD-10-CM | POA: Diagnosis not present

## 2017-02-17 DIAGNOSIS — F3181 Bipolar II disorder: Secondary | ICD-10-CM | POA: Diagnosis not present

## 2017-02-17 DIAGNOSIS — F908 Attention-deficit hyperactivity disorder, other type: Secondary | ICD-10-CM | POA: Diagnosis not present

## 2017-03-03 DIAGNOSIS — F908 Attention-deficit hyperactivity disorder, other type: Secondary | ICD-10-CM | POA: Diagnosis not present

## 2017-03-03 DIAGNOSIS — F3181 Bipolar II disorder: Secondary | ICD-10-CM | POA: Diagnosis not present

## 2017-03-04 DIAGNOSIS — M7062 Trochanteric bursitis, left hip: Secondary | ICD-10-CM | POA: Diagnosis not present

## 2017-03-04 DIAGNOSIS — M2170 Unequal limb length (acquired), unspecified site: Secondary | ICD-10-CM | POA: Diagnosis not present

## 2017-03-11 DIAGNOSIS — F3181 Bipolar II disorder: Secondary | ICD-10-CM | POA: Diagnosis not present

## 2017-03-11 DIAGNOSIS — F908 Attention-deficit hyperactivity disorder, other type: Secondary | ICD-10-CM | POA: Diagnosis not present

## 2017-03-12 ENCOUNTER — Ambulatory Visit: Payer: Medicare Other | Admitting: Internal Medicine

## 2017-03-12 ENCOUNTER — Encounter: Payer: Self-pay | Admitting: Internal Medicine

## 2017-03-12 VITALS — BP 122/70 | HR 72 | Ht 66.0 in | Wt 156.0 lb

## 2017-03-12 DIAGNOSIS — G4733 Obstructive sleep apnea (adult) (pediatric): Secondary | ICD-10-CM | POA: Diagnosis not present

## 2017-03-12 DIAGNOSIS — Z9989 Dependence on other enabling machines and devices: Secondary | ICD-10-CM

## 2017-03-12 DIAGNOSIS — I480 Paroxysmal atrial fibrillation: Secondary | ICD-10-CM | POA: Diagnosis not present

## 2017-03-12 NOTE — Progress Notes (Signed)
PCP: Ashley White, Ashley White Primary Cardiologist: Ashley White Primary EP: Ashley White is a 66 y.o. female who presents today for routine electrophysiology followup.  Since last being seen in our clinic, the patient reports doing very well.  Today, she denies symptoms of palpitations, chest pain, shortness of breath,  lower extremity edema, dizziness, presyncope, or syncope.  The patient is otherwise without complaint today.   Past Medical History:  Diagnosis Date  . ADD (attention deficit disorder)   . Cancer [facility]) 2001   left breast, treated with lumpectomy, radiation, and chemotherapy  . Depression   . Mitral valve prolapse   . Myocarditis ([place])    at age 21  . Paroxysmal atrial fibrillation (HCC)   . Sleep apnea    uses CPAP followed by Ashley White  . Stroke [facility]) 2015   Past Surgical History:  Procedure Laterality Date  . BREAST LUMPECTOMY  2001   malignancy removed  . BREAST LUMPECTOMY WITH RADIOACTIVE SEED LOCALIZATION Right 08/09/2013   benign  . BREAST SURGERY  1993   left cyst removed  . CATARACT EXTRACTION    . ELECTROPHYSIOLOGIC STUDY N/A 01/09/2016   Procedure: Atrial Fibrillation Ablation;  Surgeon: Ashley White, Ashley White;  Location: [place] [facility];  Service: Cardiovascular;  Laterality: N/A;  . OOPHORECTOMY Left   . right knee tibia surgery     2006    ROS- all systems are reviewed and negatives except as per HPI above  Current Outpatient Medications  Medication Sig Dispense Refill  . apixaban (ELIQUIS) 5 MG TABS tablet Take 1 tablet (5 mg total) by mouth 2 (two) times daily. 60 tablet 2  . lamoTRIgine (LAMICTAL) 200 MG tablet Take 1 tablet by mouth at bedtime.  5  . metoprolol succinate (TOPROL-XL) 25 MG 24 hr tablet Take 25 mg by mouth daily with breakfast.     . modafinil (PROVIGIL) 200 MG tablet Take 200 mg by mouth daily.  1  . Omega-3 Fatty Acids (FISH OIL) 1000 MG CAPS Take 1,000 mg by mouth at bedtime.     [name] PARoxetine (PAXIL) 20 MG tablet Take 20  mg by mouth at bedtime.     . pravastatin (PRAVACHOL) 40 MG tablet Take 1 tablet by mouth every other day.   12   No current facility-administered medications for this visit.     Physical Exam: Vitals:   03/12/17 1012  BP: 122/70  Pulse: 72  SpO2: 99%  Weight: 156 lb (70.8 kg)  Height: 5\' 6"  (1.676 m)    GEN- The patient is well appearing, alert and oriented x 3 today.   Head- normocephalic, atraumatic Eyes-  Sclera clear, conjunctiva pink Ears- hearing intact Oropharynx- clear Lungs- Clear to ausculation bilaterally, normal work of breathing Heart- Regular rate and rhythm, no murmurs, rubs or gallops, PMI not laterally displaced GI- soft, NT, ND, + BS Extremities- no clubbing, cyanosis, or edema  EKG tracing ordered today is personally reviewed and shows sinus rhythm, normal ekg  Assessment and Plan:  1. Paroxysmal atrial fibrillation Well controlled post ablation off AAD therapy Continue long term anticoagulation given prior stroke  2. OSA Compliant with CPAP  3. ? 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  She is going to see a genetic specialist at [facility] in May.  I have reviewed her prior echo (2015), TEE, carotid dopplers, and cardiac  CT with her today.  I do not see any features of the disease.  As her aorta was not discussed in cardiac CT report from 2017, I will see if Ashley White is willing to look at it again for me.  She will follow-up with Ashley White after her genetic test has been performed.  Follow-up with Ashley White as scheduled I will see in a year  Ashley White Ashley White, Ashley White 03/12/2017 10:34 AM

## 2017-03-12 NOTE — Patient Instructions (Signed)

## 2017-03-17 DIAGNOSIS — F908 Attention-deficit hyperactivity disorder, other type: Secondary | ICD-10-CM | POA: Diagnosis not present

## 2017-03-17 DIAGNOSIS — F3181 Bipolar II disorder: Secondary | ICD-10-CM | POA: Diagnosis not present

## 2017-03-31 ENCOUNTER — Ambulatory Visit: Payer: BLUE CROSS/BLUE SHIELD | Admitting: Internal Medicine

## 2017-04-02 DIAGNOSIS — H1133 Conjunctival hemorrhage, bilateral: Secondary | ICD-10-CM | POA: Diagnosis not present

## 2017-04-02 DIAGNOSIS — Z961 Presence of intraocular lens: Secondary | ICD-10-CM | POA: Diagnosis not present

## 2017-04-02 DIAGNOSIS — H10023 Other mucopurulent conjunctivitis, bilateral: Secondary | ICD-10-CM | POA: Diagnosis not present

## 2017-04-03 DIAGNOSIS — F908 Attention-deficit hyperactivity disorder, other type: Secondary | ICD-10-CM | POA: Diagnosis not present

## 2017-04-03 DIAGNOSIS — F3181 Bipolar II disorder: Secondary | ICD-10-CM | POA: Diagnosis not present

## 2017-04-07 DIAGNOSIS — F908 Attention-deficit hyperactivity disorder, other type: Secondary | ICD-10-CM | POA: Diagnosis not present

## 2017-04-07 DIAGNOSIS — F3181 Bipolar II disorder: Secondary | ICD-10-CM | POA: Diagnosis not present

## 2017-04-09 DIAGNOSIS — H10023 Other mucopurulent conjunctivitis, bilateral: Secondary | ICD-10-CM | POA: Diagnosis not present

## 2017-04-09 DIAGNOSIS — G4733 Obstructive sleep apnea (adult) (pediatric): Secondary | ICD-10-CM | POA: Diagnosis not present

## 2017-04-14 DIAGNOSIS — F3181 Bipolar II disorder: Secondary | ICD-10-CM | POA: Diagnosis not present

## 2017-04-14 DIAGNOSIS — F908 Attention-deficit hyperactivity disorder, other type: Secondary | ICD-10-CM | POA: Diagnosis not present

## 2017-04-21 DIAGNOSIS — E7849 Other hyperlipidemia: Secondary | ICD-10-CM | POA: Diagnosis not present

## 2017-04-21 DIAGNOSIS — Z853 Personal history of malignant neoplasm of breast: Secondary | ICD-10-CM | POA: Diagnosis not present

## 2017-04-21 DIAGNOSIS — F908 Attention-deficit hyperactivity disorder, other type: Secondary | ICD-10-CM | POA: Diagnosis not present

## 2017-04-21 DIAGNOSIS — F3181 Bipolar II disorder: Secondary | ICD-10-CM | POA: Diagnosis not present

## 2017-04-21 DIAGNOSIS — I63412 Cerebral infarction due to embolism of left middle cerebral artery: Secondary | ICD-10-CM | POA: Diagnosis not present

## 2017-04-21 DIAGNOSIS — I480 Paroxysmal atrial fibrillation: Secondary | ICD-10-CM | POA: Diagnosis not present

## 2017-04-24 DIAGNOSIS — F900 Attention-deficit hyperactivity disorder, predominantly inattentive type: Secondary | ICD-10-CM | POA: Diagnosis not present

## 2017-04-24 DIAGNOSIS — F3170 Bipolar disorder, currently in remission, most recent episode unspecified: Secondary | ICD-10-CM | POA: Diagnosis not present

## 2017-04-24 DIAGNOSIS — F4010 Social phobia, unspecified: Secondary | ICD-10-CM | POA: Diagnosis not present

## 2017-04-28 DIAGNOSIS — F908 Attention-deficit hyperactivity disorder, other type: Secondary | ICD-10-CM | POA: Diagnosis not present

## 2017-04-28 DIAGNOSIS — F3181 Bipolar II disorder: Secondary | ICD-10-CM | POA: Diagnosis not present

## 2017-05-05 DIAGNOSIS — F908 Attention-deficit hyperactivity disorder, other type: Secondary | ICD-10-CM | POA: Diagnosis not present

## 2017-05-05 DIAGNOSIS — F3181 Bipolar II disorder: Secondary | ICD-10-CM | POA: Diagnosis not present

## 2017-05-08 DIAGNOSIS — R399 Unspecified symptoms and signs involving the genitourinary system: Secondary | ICD-10-CM | POA: Diagnosis not present

## 2017-05-08 DIAGNOSIS — R822 Biliuria: Secondary | ICD-10-CM | POA: Diagnosis not present

## 2017-05-12 DIAGNOSIS — F908 Attention-deficit hyperactivity disorder, other type: Secondary | ICD-10-CM | POA: Diagnosis not present

## 2017-05-12 DIAGNOSIS — F3181 Bipolar II disorder: Secondary | ICD-10-CM | POA: Diagnosis not present

## 2017-05-19 DIAGNOSIS — F3181 Bipolar II disorder: Secondary | ICD-10-CM | POA: Diagnosis not present

## 2017-05-19 DIAGNOSIS — F908 Attention-deficit hyperactivity disorder, other type: Secondary | ICD-10-CM | POA: Diagnosis not present

## 2017-05-26 DIAGNOSIS — F908 Attention-deficit hyperactivity disorder, other type: Secondary | ICD-10-CM | POA: Diagnosis not present

## 2017-05-26 DIAGNOSIS — F4010 Social phobia, unspecified: Secondary | ICD-10-CM | POA: Diagnosis not present

## 2017-05-26 DIAGNOSIS — F3181 Bipolar II disorder: Secondary | ICD-10-CM | POA: Diagnosis not present

## 2017-05-26 DIAGNOSIS — F900 Attention-deficit hyperactivity disorder, predominantly inattentive type: Secondary | ICD-10-CM | POA: Diagnosis not present

## 2017-05-26 DIAGNOSIS — F3170 Bipolar disorder, currently in remission, most recent episode unspecified: Secondary | ICD-10-CM | POA: Diagnosis not present

## 2017-06-02 DIAGNOSIS — F908 Attention-deficit hyperactivity disorder, other type: Secondary | ICD-10-CM | POA: Diagnosis not present

## 2017-06-02 DIAGNOSIS — F3181 Bipolar II disorder: Secondary | ICD-10-CM | POA: Diagnosis not present

## 2017-06-09 DIAGNOSIS — F908 Attention-deficit hyperactivity disorder, other type: Secondary | ICD-10-CM | POA: Diagnosis not present

## 2017-06-09 DIAGNOSIS — F3181 Bipolar II disorder: Secondary | ICD-10-CM | POA: Diagnosis not present

## 2017-06-19 DIAGNOSIS — F908 Attention-deficit hyperactivity disorder, other type: Secondary | ICD-10-CM | POA: Diagnosis not present

## 2017-06-19 DIAGNOSIS — F3181 Bipolar II disorder: Secondary | ICD-10-CM | POA: Diagnosis not present

## 2017-06-23 DIAGNOSIS — F908 Attention-deficit hyperactivity disorder, other type: Secondary | ICD-10-CM | POA: Diagnosis not present

## 2017-06-23 DIAGNOSIS — F3181 Bipolar II disorder: Secondary | ICD-10-CM | POA: Diagnosis not present

## 2017-06-30 DIAGNOSIS — F3181 Bipolar II disorder: Secondary | ICD-10-CM | POA: Diagnosis not present

## 2017-06-30 DIAGNOSIS — F908 Attention-deficit hyperactivity disorder, other type: Secondary | ICD-10-CM | POA: Diagnosis not present

## 2017-07-14 DIAGNOSIS — F908 Attention-deficit hyperactivity disorder, other type: Secondary | ICD-10-CM | POA: Diagnosis not present

## 2017-07-14 DIAGNOSIS — F3181 Bipolar II disorder: Secondary | ICD-10-CM | POA: Diagnosis not present

## 2017-07-31 DIAGNOSIS — F4010 Social phobia, unspecified: Secondary | ICD-10-CM | POA: Diagnosis not present

## 2017-07-31 DIAGNOSIS — F900 Attention-deficit hyperactivity disorder, predominantly inattentive type: Secondary | ICD-10-CM | POA: Diagnosis not present

## 2017-07-31 DIAGNOSIS — F3170 Bipolar disorder, currently in remission, most recent episode unspecified: Secondary | ICD-10-CM | POA: Diagnosis not present

## 2017-08-04 DIAGNOSIS — F3181 Bipolar II disorder: Secondary | ICD-10-CM | POA: Diagnosis not present

## 2017-08-04 DIAGNOSIS — F908 Attention-deficit hyperactivity disorder, other type: Secondary | ICD-10-CM | POA: Diagnosis not present

## 2017-08-11 DIAGNOSIS — F908 Attention-deficit hyperactivity disorder, other type: Secondary | ICD-10-CM | POA: Diagnosis not present

## 2017-08-11 DIAGNOSIS — F3181 Bipolar II disorder: Secondary | ICD-10-CM | POA: Diagnosis not present

## 2017-08-18 DIAGNOSIS — F3181 Bipolar II disorder: Secondary | ICD-10-CM | POA: Diagnosis not present

## 2017-08-18 DIAGNOSIS — F908 Attention-deficit hyperactivity disorder, other type: Secondary | ICD-10-CM | POA: Diagnosis not present

## 2017-08-25 DIAGNOSIS — F3181 Bipolar II disorder: Secondary | ICD-10-CM | POA: Diagnosis not present

## 2017-08-25 DIAGNOSIS — F908 Attention-deficit hyperactivity disorder, other type: Secondary | ICD-10-CM | POA: Diagnosis not present

## 2017-09-01 DIAGNOSIS — M25511 Pain in right shoulder: Secondary | ICD-10-CM | POA: Diagnosis not present

## 2017-09-01 DIAGNOSIS — M25552 Pain in left hip: Secondary | ICD-10-CM | POA: Diagnosis not present

## 2017-09-01 DIAGNOSIS — M542 Cervicalgia: Secondary | ICD-10-CM | POA: Diagnosis not present

## 2017-09-01 DIAGNOSIS — M25551 Pain in right hip: Secondary | ICD-10-CM | POA: Diagnosis not present

## 2017-09-02 DIAGNOSIS — W57XXXA Bitten or stung by nonvenomous insect and other nonvenomous arthropods, initial encounter: Secondary | ICD-10-CM | POA: Diagnosis not present

## 2017-09-02 DIAGNOSIS — L539 Erythematous condition, unspecified: Secondary | ICD-10-CM | POA: Diagnosis not present

## 2017-09-04 DIAGNOSIS — R030 Elevated blood-pressure reading, without diagnosis of hypertension: Secondary | ICD-10-CM | POA: Diagnosis not present

## 2017-09-04 DIAGNOSIS — L02419 Cutaneous abscess of limb, unspecified: Secondary | ICD-10-CM | POA: Diagnosis not present

## 2017-09-08 DIAGNOSIS — F908 Attention-deficit hyperactivity disorder, other type: Secondary | ICD-10-CM | POA: Diagnosis not present

## 2017-09-08 DIAGNOSIS — F3181 Bipolar II disorder: Secondary | ICD-10-CM | POA: Diagnosis not present

## 2017-09-09 DIAGNOSIS — M25511 Pain in right shoulder: Secondary | ICD-10-CM | POA: Diagnosis not present

## 2017-09-09 DIAGNOSIS — M25551 Pain in right hip: Secondary | ICD-10-CM | POA: Diagnosis not present

## 2017-09-09 DIAGNOSIS — M25552 Pain in left hip: Secondary | ICD-10-CM | POA: Diagnosis not present

## 2017-09-09 DIAGNOSIS — M542 Cervicalgia: Secondary | ICD-10-CM | POA: Diagnosis not present

## 2017-09-15 DIAGNOSIS — F908 Attention-deficit hyperactivity disorder, other type: Secondary | ICD-10-CM | POA: Diagnosis not present

## 2017-09-15 DIAGNOSIS — F3181 Bipolar II disorder: Secondary | ICD-10-CM | POA: Diagnosis not present

## 2017-09-18 DIAGNOSIS — M25511 Pain in right shoulder: Secondary | ICD-10-CM | POA: Diagnosis not present

## 2017-09-18 DIAGNOSIS — M25551 Pain in right hip: Secondary | ICD-10-CM | POA: Diagnosis not present

## 2017-09-18 DIAGNOSIS — M25552 Pain in left hip: Secondary | ICD-10-CM | POA: Diagnosis not present

## 2017-09-18 DIAGNOSIS — M542 Cervicalgia: Secondary | ICD-10-CM | POA: Diagnosis not present

## 2017-10-06 DIAGNOSIS — F3181 Bipolar II disorder: Secondary | ICD-10-CM | POA: Diagnosis not present

## 2017-10-06 DIAGNOSIS — F908 Attention-deficit hyperactivity disorder, other type: Secondary | ICD-10-CM | POA: Diagnosis not present

## 2017-10-13 DIAGNOSIS — F3181 Bipolar II disorder: Secondary | ICD-10-CM | POA: Diagnosis not present

## 2017-10-13 DIAGNOSIS — F908 Attention-deficit hyperactivity disorder, other type: Secondary | ICD-10-CM | POA: Diagnosis not present

## 2017-10-16 DIAGNOSIS — M25551 Pain in right hip: Secondary | ICD-10-CM | POA: Diagnosis not present

## 2017-10-16 DIAGNOSIS — M25552 Pain in left hip: Secondary | ICD-10-CM | POA: Diagnosis not present

## 2017-10-16 DIAGNOSIS — M25511 Pain in right shoulder: Secondary | ICD-10-CM | POA: Diagnosis not present

## 2017-10-16 DIAGNOSIS — M542 Cervicalgia: Secondary | ICD-10-CM | POA: Diagnosis not present

## 2017-10-20 DIAGNOSIS — Z7901 Long term (current) use of anticoagulants: Secondary | ICD-10-CM | POA: Diagnosis not present

## 2017-10-20 DIAGNOSIS — I63412 Cerebral infarction due to embolism of left middle cerebral artery: Secondary | ICD-10-CM | POA: Diagnosis not present

## 2017-10-20 DIAGNOSIS — F3181 Bipolar II disorder: Secondary | ICD-10-CM | POA: Diagnosis not present

## 2017-10-20 DIAGNOSIS — F908 Attention-deficit hyperactivity disorder, other type: Secondary | ICD-10-CM | POA: Diagnosis not present

## 2017-10-20 DIAGNOSIS — Z853 Personal history of malignant neoplasm of breast: Secondary | ICD-10-CM | POA: Diagnosis not present

## 2017-10-20 DIAGNOSIS — I480 Paroxysmal atrial fibrillation: Secondary | ICD-10-CM | POA: Diagnosis not present

## 2017-10-27 DIAGNOSIS — F3181 Bipolar II disorder: Secondary | ICD-10-CM | POA: Diagnosis not present

## 2017-10-27 DIAGNOSIS — F908 Attention-deficit hyperactivity disorder, other type: Secondary | ICD-10-CM | POA: Diagnosis not present

## 2017-10-29 DIAGNOSIS — M25551 Pain in right hip: Secondary | ICD-10-CM | POA: Diagnosis not present

## 2017-10-29 DIAGNOSIS — M25552 Pain in left hip: Secondary | ICD-10-CM | POA: Diagnosis not present

## 2017-10-29 DIAGNOSIS — M542 Cervicalgia: Secondary | ICD-10-CM | POA: Diagnosis not present

## 2017-10-29 DIAGNOSIS — M25511 Pain in right shoulder: Secondary | ICD-10-CM | POA: Diagnosis not present

## 2017-11-03 DIAGNOSIS — F3181 Bipolar II disorder: Secondary | ICD-10-CM | POA: Diagnosis not present

## 2017-11-03 DIAGNOSIS — F908 Attention-deficit hyperactivity disorder, other type: Secondary | ICD-10-CM | POA: Diagnosis not present

## 2017-11-17 DIAGNOSIS — F3181 Bipolar II disorder: Secondary | ICD-10-CM | POA: Diagnosis not present

## 2017-11-17 DIAGNOSIS — F908 Attention-deficit hyperactivity disorder, other type: Secondary | ICD-10-CM | POA: Diagnosis not present

## 2017-11-19 DIAGNOSIS — M25511 Pain in right shoulder: Secondary | ICD-10-CM | POA: Diagnosis not present

## 2017-11-19 DIAGNOSIS — M25552 Pain in left hip: Secondary | ICD-10-CM | POA: Diagnosis not present

## 2017-11-19 DIAGNOSIS — M25551 Pain in right hip: Secondary | ICD-10-CM | POA: Diagnosis not present

## 2017-11-19 DIAGNOSIS — M542 Cervicalgia: Secondary | ICD-10-CM | POA: Diagnosis not present

## 2017-11-20 DIAGNOSIS — F4010 Social phobia, unspecified: Secondary | ICD-10-CM | POA: Diagnosis not present

## 2017-11-20 DIAGNOSIS — F3170 Bipolar disorder, currently in remission, most recent episode unspecified: Secondary | ICD-10-CM | POA: Diagnosis not present

## 2017-11-20 DIAGNOSIS — F900 Attention-deficit hyperactivity disorder, predominantly inattentive type: Secondary | ICD-10-CM | POA: Diagnosis not present

## 2017-11-24 DIAGNOSIS — F908 Attention-deficit hyperactivity disorder, other type: Secondary | ICD-10-CM | POA: Diagnosis not present

## 2017-11-24 DIAGNOSIS — F3181 Bipolar II disorder: Secondary | ICD-10-CM | POA: Diagnosis not present

## 2017-12-04 DIAGNOSIS — M542 Cervicalgia: Secondary | ICD-10-CM | POA: Diagnosis not present

## 2017-12-04 DIAGNOSIS — M25552 Pain in left hip: Secondary | ICD-10-CM | POA: Diagnosis not present

## 2017-12-04 DIAGNOSIS — M25551 Pain in right hip: Secondary | ICD-10-CM | POA: Diagnosis not present

## 2017-12-04 DIAGNOSIS — M25511 Pain in right shoulder: Secondary | ICD-10-CM | POA: Diagnosis not present

## 2017-12-08 DIAGNOSIS — F3181 Bipolar II disorder: Secondary | ICD-10-CM | POA: Diagnosis not present

## 2017-12-08 DIAGNOSIS — F908 Attention-deficit hyperactivity disorder, other type: Secondary | ICD-10-CM | POA: Diagnosis not present

## 2017-12-15 DIAGNOSIS — F908 Attention-deficit hyperactivity disorder, other type: Secondary | ICD-10-CM | POA: Diagnosis not present

## 2017-12-15 DIAGNOSIS — F3181 Bipolar II disorder: Secondary | ICD-10-CM | POA: Diagnosis not present

## 2017-12-16 DIAGNOSIS — M25511 Pain in right shoulder: Secondary | ICD-10-CM | POA: Diagnosis not present

## 2017-12-16 DIAGNOSIS — M542 Cervicalgia: Secondary | ICD-10-CM | POA: Diagnosis not present

## 2017-12-16 DIAGNOSIS — M25552 Pain in left hip: Secondary | ICD-10-CM | POA: Diagnosis not present

## 2017-12-16 DIAGNOSIS — M25551 Pain in right hip: Secondary | ICD-10-CM | POA: Diagnosis not present

## 2017-12-22 DIAGNOSIS — F3181 Bipolar II disorder: Secondary | ICD-10-CM | POA: Diagnosis not present

## 2017-12-22 DIAGNOSIS — F908 Attention-deficit hyperactivity disorder, other type: Secondary | ICD-10-CM | POA: Diagnosis not present

## 2018-01-07 DIAGNOSIS — M25552 Pain in left hip: Secondary | ICD-10-CM | POA: Diagnosis not present

## 2018-01-07 DIAGNOSIS — M25511 Pain in right shoulder: Secondary | ICD-10-CM | POA: Diagnosis not present

## 2018-01-07 DIAGNOSIS — M25551 Pain in right hip: Secondary | ICD-10-CM | POA: Diagnosis not present

## 2018-01-07 DIAGNOSIS — M542 Cervicalgia: Secondary | ICD-10-CM | POA: Diagnosis not present

## 2018-01-12 DIAGNOSIS — F908 Attention-deficit hyperactivity disorder, other type: Secondary | ICD-10-CM | POA: Diagnosis not present

## 2018-01-12 DIAGNOSIS — F3181 Bipolar II disorder: Secondary | ICD-10-CM | POA: Diagnosis not present

## 2018-01-19 DIAGNOSIS — F3181 Bipolar II disorder: Secondary | ICD-10-CM | POA: Diagnosis not present

## 2018-01-19 DIAGNOSIS — F908 Attention-deficit hyperactivity disorder, other type: Secondary | ICD-10-CM | POA: Diagnosis not present

## 2018-01-21 DIAGNOSIS — M25511 Pain in right shoulder: Secondary | ICD-10-CM | POA: Diagnosis not present

## 2018-01-21 DIAGNOSIS — M25551 Pain in right hip: Secondary | ICD-10-CM | POA: Diagnosis not present

## 2018-01-21 DIAGNOSIS — M542 Cervicalgia: Secondary | ICD-10-CM | POA: Diagnosis not present

## 2018-01-21 DIAGNOSIS — M25552 Pain in left hip: Secondary | ICD-10-CM | POA: Diagnosis not present

## 2018-01-26 DIAGNOSIS — F908 Attention-deficit hyperactivity disorder, other type: Secondary | ICD-10-CM | POA: Diagnosis not present

## 2018-01-26 DIAGNOSIS — F3181 Bipolar II disorder: Secondary | ICD-10-CM | POA: Diagnosis not present

## 2018-02-02 DIAGNOSIS — F3181 Bipolar II disorder: Secondary | ICD-10-CM | POA: Diagnosis not present

## 2018-02-02 DIAGNOSIS — F908 Attention-deficit hyperactivity disorder, other type: Secondary | ICD-10-CM | POA: Diagnosis not present

## 2018-02-04 DIAGNOSIS — Z1231 Encounter for screening mammogram for malignant neoplasm of breast: Secondary | ICD-10-CM | POA: Diagnosis not present

## 2018-02-04 DIAGNOSIS — Z853 Personal history of malignant neoplasm of breast: Secondary | ICD-10-CM | POA: Diagnosis not present

## 2018-02-11 DIAGNOSIS — M542 Cervicalgia: Secondary | ICD-10-CM | POA: Diagnosis not present

## 2018-02-11 DIAGNOSIS — M25511 Pain in right shoulder: Secondary | ICD-10-CM | POA: Diagnosis not present

## 2018-02-11 DIAGNOSIS — M25552 Pain in left hip: Secondary | ICD-10-CM | POA: Diagnosis not present

## 2018-02-11 DIAGNOSIS — M25551 Pain in right hip: Secondary | ICD-10-CM | POA: Diagnosis not present

## 2018-02-16 DIAGNOSIS — F908 Attention-deficit hyperactivity disorder, other type: Secondary | ICD-10-CM | POA: Diagnosis not present

## 2018-02-16 DIAGNOSIS — F3181 Bipolar II disorder: Secondary | ICD-10-CM | POA: Diagnosis not present

## 2018-03-04 DIAGNOSIS — M542 Cervicalgia: Secondary | ICD-10-CM | POA: Diagnosis not present

## 2018-03-04 DIAGNOSIS — M25511 Pain in right shoulder: Secondary | ICD-10-CM | POA: Diagnosis not present

## 2018-03-04 DIAGNOSIS — M25551 Pain in right hip: Secondary | ICD-10-CM | POA: Diagnosis not present

## 2018-03-04 DIAGNOSIS — M25552 Pain in left hip: Secondary | ICD-10-CM | POA: Diagnosis not present

## 2018-03-09 DIAGNOSIS — F3181 Bipolar II disorder: Secondary | ICD-10-CM | POA: Diagnosis not present

## 2018-03-09 DIAGNOSIS — F908 Attention-deficit hyperactivity disorder, other type: Secondary | ICD-10-CM | POA: Diagnosis not present

## 2018-03-12 DIAGNOSIS — F3170 Bipolar disorder, currently in remission, most recent episode unspecified: Secondary | ICD-10-CM | POA: Diagnosis not present

## 2018-03-12 DIAGNOSIS — F900 Attention-deficit hyperactivity disorder, predominantly inattentive type: Secondary | ICD-10-CM | POA: Diagnosis not present

## 2018-03-12 DIAGNOSIS — F4010 Social phobia, unspecified: Secondary | ICD-10-CM | POA: Diagnosis not present

## 2018-03-16 DIAGNOSIS — F908 Attention-deficit hyperactivity disorder, other type: Secondary | ICD-10-CM | POA: Diagnosis not present

## 2018-03-16 DIAGNOSIS — F3181 Bipolar II disorder: Secondary | ICD-10-CM | POA: Diagnosis not present

## 2018-03-30 ENCOUNTER — Other Ambulatory Visit: Payer: Self-pay | Admitting: Cardiology

## 2018-03-30 NOTE — Telephone Encounter (Signed)
° °  1. Which medications need to be refilled? (please list name of each medication and dose if known) flecainide 100mg  1BID  2. Which pharmacy/location (including street and city if local pharmacy) is medication to be sent to? [facility] [address] [place]  3. Do they need a 30 day or 90 day supply? [place]

## 2018-03-31 ENCOUNTER — Other Ambulatory Visit: Payer: Self-pay | Admitting: Cardiology

## 2018-03-31 MED ORDER — FLECAINIDE ACETATE 100 MG PO TABS: 100.0000 mg | ORAL_TABLET | Freq: Two times a day (BID) | ORAL | 0 refills | Status: DC

## 2018-03-31 NOTE — Telephone Encounter (Signed)
Flecainide 100 mg twice daily refilled until appointment per Dr. Agustin Cree.

## 2018-04-01 ENCOUNTER — Emergency Department (HOSPITAL_COMMUNITY)
Admission: EM | Admit: 2018-04-01 | Discharge: 2018-04-01 | Disposition: A | Discharge status: Home / Self Care | Payer: Medicare Other | Attending: Emergency Medicine | Admitting: Emergency Medicine

## 2018-04-01 ENCOUNTER — Emergency Department (HOSPITAL_COMMUNITY): Payer: Medicare Other

## 2018-04-01 ENCOUNTER — Telehealth: Payer: Self-pay | Admitting: Physician Assistant

## 2018-04-01 ENCOUNTER — Other Ambulatory Visit: Payer: Self-pay

## 2018-04-01 DIAGNOSIS — I480 Paroxysmal atrial fibrillation: Secondary | ICD-10-CM | POA: Diagnosis not present

## 2018-04-01 DIAGNOSIS — Z79899 Other long term (current) drug therapy: Secondary | ICD-10-CM | POA: Diagnosis not present

## 2018-04-01 DIAGNOSIS — I4892 Unspecified atrial flutter: Secondary | ICD-10-CM | POA: Diagnosis not present

## 2018-04-01 DIAGNOSIS — F909 Attention-deficit hyperactivity disorder, unspecified type: Secondary | ICD-10-CM | POA: Diagnosis not present

## 2018-04-01 DIAGNOSIS — R000 Tachycardia, unspecified: Secondary | ICD-10-CM | POA: Diagnosis not present

## 2018-04-01 DIAGNOSIS — I4891 Unspecified atrial fibrillation: Secondary | ICD-10-CM | POA: Diagnosis not present

## 2018-04-01 LAB — CBC
HEMATOCRIT: 45.5 % (ref 36.0–46.0)
Hemoglobin: 14.6 g/dL (ref 12.0–15.0)
MCH: 29.3 pg (ref 26.0–34.0)
MCHC: 32.1 g/dL (ref 30.0–36.0)
MCV: 91.2 fL (ref 80.0–100.0)
PLATELETS: 253 10*3/uL (ref 150–400)
RBC: 4.99 MIL/uL (ref 3.87–5.11)
RDW: 12.8 % (ref 11.5–15.5)
WBC: 8 10*3/uL (ref 4.0–10.5)
nRBC: 0 % (ref 0.0–0.2)

## 2018-04-01 LAB — BASIC METABOLIC PANEL
Anion gap: 10 (ref 5–15)
BUN: 12 mg/dL (ref 8–23)
CALCIUM: 9.5 mg/dL (ref 8.9–10.3)
CHLORIDE: 104 mmol/L (ref 98–111)
CO2: 27 mmol/L (ref 22–32)
CREATININE: 0.82 mg/dL (ref 0.44–1.00)
GFR calc Af Amer: >60 mL/min (ref >60)
GFR calc non Af Amer: >60 mL/min (ref >60)
GLUCOSE: 119 mg/dL — AB (ref 70–99)
Potassium: 4.3 mmol/L (ref 3.5–5.1)
Sodium: 141 mmol/L (ref 135–145)

## 2018-04-01 LAB — I-STAT TROPONIN, ED: Troponin i, poc: 0 ng/mL (ref 0.00–0.08)

## 2018-04-01 MED ORDER — IBUPROFEN 400 MG PO TABS: 400.0000 mg | ORAL_TABLET | Freq: Once | ORAL | Status: DC

## 2018-04-01 MED ORDER — ETOMIDATE 2 MG/ML IV SOLN
INTRAVENOUS | Status: AC | PRN
  Administered 2018-04-01: 10 mg via INTRAVENOUS

## 2018-04-01 MED ORDER — SODIUM CHLORIDE 0.9 % IV BOLUS
500.0000 mL | Freq: Once | INTRAVENOUS | Status: AC
Start: 2018-04-01 — End: 2018-04-01
  Administered 2018-04-01: 500 mL via INTRAVENOUS

## 2018-04-01 MED ORDER — COLCHICINE 0.6 MG PO TABS: 0.6000 mg | ORAL_TABLET | Freq: Once | ORAL | Status: DC

## 2018-04-01 MED ORDER — ETOMIDATE 2 MG/ML IV SOLN
10.0000 mg | Freq: Once | INTRAVENOUS | Status: DC
  Filled 2018-04-01: qty 10

## 2018-04-01 MED ORDER — SODIUM CHLORIDE 0.9 % IV BOLUS
500.0000 mL | Freq: Once | INTRAVENOUS | Status: AC
  Administered 2018-04-01: 500 mL via INTRAVENOUS

## 2018-04-01 MED ORDER — FLECAINIDE ACETATE 100 MG PO TABS
200.0000 mg | ORAL_TABLET | Freq: Once | ORAL | Status: AC
  Administered 2018-04-01: 200 mg via ORAL
  Filled 2018-04-01 (×2): qty 2

## 2018-04-01 MED ORDER — METOPROLOL TARTRATE 5 MG/5ML IV SOLN
2.5000 mg | Freq: Once | INTRAVENOUS | Status: AC
  Administered 2018-04-01: 2.5 mg via INTRAVENOUS
  Filled 2018-04-01: qty 5

## 2018-04-01 MED ORDER — METOPROLOL TARTRATE 5 MG/5ML IV SOLN
5.0000 mg | Freq: Once | INTRAVENOUS | Status: AC
  Administered 2018-04-01: 5 mg via INTRAVENOUS
  Filled 2018-04-01: qty 5

## 2018-04-01 NOTE — ED Provider Notes (Signed)
[place] EMERGENCY DEPARTMENT Provider Note   CSN: [id number] Arrival date & time: 04/01/18  2993     History   Chief Complaint Chief Complaint  Patient presents with  . Atrial Fibrillation    HPI Ashley White is a 68 y.o. female.  HPI    She presents for evaluation of periods of irregular heartbeat associated with rapid heartbeat, present for about a week.  4 days ago she started taking flecainide, twice a day to control the symptoms.  Initially she was using an old prescription, then got a newer prescription, yesterday.  She feels like she is getting some improvement from the new prescription, but remains uncomfortable.  She thinks that her stress, from being with family members over the holiday season is causing her discomfort.  She denies fever, chills, nausea, vomiting, cough, shortness of breath at rest or chest pain.  She sometimes notices some shortness of breath when she is walking.  She and her husband typically walk 2 to 3 miles daily.  Does this for exercise.  There are no other known modifying factors.  Past Medical History:  Diagnosis Date  . ADD (attention deficit disorder)   . Cancer [facility]) 2001   left breast, treated with lumpectomy, radiation, and chemotherapy  . Depression   . Mitral valve prolapse   . 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 Paroxysmal atrial fibrillation (HCC)   . Sleep apnea    uses CPAP followed by Dr Maxwell Caul  . Stroke [facility]) 2015    Patient Active Problem List   Diagnosis Date Noted  . Mitral valve disorder 01/09/2016  . A-fib ([place]) 01/09/2016  . Hyperlipidemia 04/28/2014  . Atrial fibrillation ([place]) 12/27/2013  . Other and unspecified hyperlipidemia 12/27/2013  . OSA on CPAP 12/27/2013  . 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 Abnormal mammogram 07/20/2013    Past Surgical History:  Procedure Laterality Date  . BREAST LUMPECTOMY  2001   malignancy removed  . BREAST LUMPECTOMY WITH RADIOACTIVE SEED  LOCALIZATION Right 08/09/2013   benign  . BREAST SURGERY  1993   left cyst removed  . CATARACT EXTRACTION    . ELECTROPHYSIOLOGIC STUDY N/A 01/09/2016   Procedure: Atrial Fibrillation Ablation;  Surgeon: Thompson Grayer, MD;  Location: [place] CV LAB;  Service: Cardiovascular;  Laterality: N/A;  . OOPHORECTOMY Left   . right knee tibia surgery     2006     OB History   No obstetric history on file.      Home Medications    Prior to Admission medications   Medication Sig Start Date End Date Taking? Authorizing Provider  apixaban (ELIQUIS) 5 MG TABS tablet Take 1 tablet (5 mg total) by mouth 2 (two) times daily. 12/27/13  Yes Donzetta Starch, NP  cetirizine (ZYRTEC) 10 MG tablet Take 10 mg by mouth every evening.   Yes [provider]  flecainide (TAMBOCOR) 100 MG tablet TAKE 1 TABLET(100 MG) BY MOUTH TWICE DAILY Patient taking differently: Take 100 mg by mouth 2 (two) times daily.  03/31/18  Yes Park Liter, MD  lamoTRIgine (LAMICTAL) 200 MG tablet Take 200 mg by mouth at bedtime.  12/11/15  Yes [provider]  metoprolol succinate (TOPROL-XL) 25 MG 24 hr tablet Take 25 mg by mouth daily with breakfast.  12/20/13  Yes [provider]  modafinil (PROVIGIL) 100 MG tablet Take 150 mg by mouth daily before breakfast.   Yes [provider]  Omega-3 Fatty Acids (FISH OIL) 1000 MG CAPS Take 1,000 mg by mouth at bedtime.    Yes [provider]  pravastatin (PRAVACHOL) 40 MG tablet Take 40 mg by mouth every other day.  02/01/16  Yes [provider]  vortioxetine HBr (TRINTELLIX) 20 MG TABS tablet Take 20 mg by mouth daily.   Yes [provider]    Family History Family History  Problem Relation Age of Onset  . Heart disease Mother   . Colon cancer Father   . Breast cancer Paternal Grandmother     Social History Social History   Tobacco Use  . Smoking status: Never Smoker  . Smokeless tobacco: Never Used    Substance Use Topics  . Alcohol use: Yes    Alcohol/week: 0.0 standard drinks    Comment: 1-2 times a week (2 beers), sometimes more  . Drug use: No    Comment: tried crack in 1980's and marijuana as teen     Allergies   Penicillins and Nickel   Review of Systems Review of Systems  All other systems reviewed and are negative.    Physical Exam Updated Vital Signs BP 122/77   Pulse (!) 112   Resp 13   Ht 5' 5.5" (1.664 m)   Wt 66.7 kg   SpO2 96%   BMI 24.09 kg/m   Physical Exam Vitals signs and nursing note reviewed.  Constitutional:      General: She is not in acute distress.    Appearance: Normal appearance. She is well-developed. She is not ill-appearing, toxic-appearing or diaphoretic.  HENT:     Head: Normocephalic and atraumatic.     Right Ear: External ear normal.     Left Ear: External ear normal.  Eyes:     Conjunctiva/sclera: Conjunctivae normal.     Pupils: Pupils are equal, round, and reactive to light.  Neck:     Musculoskeletal: Normal range of motion and neck supple.     Trachea: Phonation normal.  Cardiovascular:     Rate and Rhythm: Tachycardia present.     Heart sounds: Normal heart sounds.     Comments: Irregular rhythm Pulmonary:     Effort: Pulmonary effort is normal.     Breath sounds: Normal breath sounds.  Abdominal:     Palpations: Abdomen is soft.     Tenderness: There is no abdominal tenderness.  Musculoskeletal: Normal range of motion.        General: No swelling, tenderness or deformity.     Right lower leg: No edema.     Left lower leg: No edema.  Skin:    General: Skin is warm and dry.  Neurological:     Mental Status: She is alert and oriented to person, place, and time.     Cranial Nerves: No cranial nerve deficit.     Sensory: No sensory deficit.     Motor: No abnormal muscle tone.     Coordination: Coordination normal.  Psychiatric:        Mood and Affect: Mood normal.        Behavior: Behavior normal.         Thought Content: Thought content normal.        Judgment: Judgment normal.      ED Treatments / Results  Labs (all labs ordered are listed, but only abnormal results are displayed) Labs Reviewed  BASIC METABOLIC PANEL - Abnormal; Notable for the following components:      Result Value   Glucose,  Bld 119 (*)    All other components within normal limits  CBC  I-STAT TROPONIN, ED    EKG EKG Interpretation  Date/Time:  Wednesday April 01 2018 09:33:29 EST Ventricular Rate:  115 PR Interval:    QRS Duration: 110 QT Interval:  362 QTC Calculation: 500 R Axis:   83 Text Interpretation:  Atrial fibrillation with rapid ventricular response Cannot rule out Anterior infarct , age undetermined Abnormal ECG Since last tracing of earlier today (90 sec. ago) No significant change was found Confirmed by Daleen Bo [phone]) on 04/01/2018 11:20:38 AM    CHA2DS2/VAS Stroke Risk Points  Current as of about an hour ago     4 >= 2 Points: High Risk  1 - 1.99 Points: Medium Risk  0 Points: Low Risk    This is the only CHA2DS2/VAS Stroke Risk Points available for the past  year.:  Last Change: N/A     Details    This score determines the patient's risk of having a stroke if the  patient has atrial fibrillation.       Points Metrics  0 Has Congestive Heart Failure:  No    Current as of about an hour ago  0 Has Vascular Disease:  No    Current as of about an hour ago  0 Has Hypertension:  No    Current as of about an hour ago  1 Age:  5    Current as of about an hour ago  0 Has Diabetes:  No    Current as of about an hour ago  2 Had Stroke:  Yes  Had TIA:  No  Had thromboembolism:  No    Current as of about an hour ago  1 Female:  Yes    Current as of about an hour ago           Radiology Dg Chest 2 View  Result Date: 04/01/2018 CLINICAL DATA:  Atrial fibrillation EXAM: CHEST - 2 VIEW COMPARISON:  Chest radiograph 11/24/2013 FINDINGS: Normal cardiac and mediastinal  contours. No consolidative pulmonary opacities. Biapical pleuroparenchymal thickening/scarring. Left axillary surgical clips. Thoracic spine degenerative changes. IMPRESSION: No acute cardiopulmonary process. Electronically Signed   By: Lovey Newcomer M.D.   On: 04/01/2018 10:47    Procedures .Critical Care Performed by: Daleen Bo, MD Authorized by: Daleen Bo, MD   Critical care provider statement:    Critical care time (minutes):  55   Critical care start time:  04/01/2018 10:40 AM   Critical care end time:  04/01/2018 4:44 PM   Critical care time was exclusive of:  Separately billable procedures and treating other patients   Critical care was time spent personally by me on the following activities:  Blood draw for specimens, development of treatment plan with patient or surrogate, discussions with consultants, evaluation of patient's response to treatment, examination of patient, obtaining history from patient or surrogate, ordering and performing treatments and interventions, ordering and review of laboratory studies, pulse oximetry, re-evaluation of patient's condition, review of old charts and ordering and review of radiographic studies   (including critical care time)  Medications Ordered in ED Medications  sodium chloride 0.9 % bolus 500 mL (0 mLs Intravenous Stopped 04/01/18 1352)  sodium chloride 0.9 % bolus 500 mL (0 mLs Intravenous Stopped 04/01/18 1221)  metoprolol tartrate (LOPRESSOR) injection 5 mg (5 mg Intravenous Given 04/01/18 1224)  flecainide (TAMBOCOR) tablet 200 mg (200 mg Oral Given 04/01/18 1538)  metoprolol tartrate (LOPRESSOR) injection 2.5  mg (2.5 mg Intravenous Given 04/01/18 1634)     Initial Impression / Assessment and Plan / ED Course  I have reviewed the triage vital signs and the nursing notes.  Pertinent labs & imaging results that were available during my care of the patient were reviewed by me and considered in my medical decision making (see chart for  details).  Clinical Course as of Apr 01 1657  Wed Apr 01, 2018  1116 Normal  I-Stat Troponin, ED (not at [facility]) [EW]  1116 Normal  CBC [EW]  1116 Normal except glucose high  Basic metabolic panel(!) [EW]  6789 No infiltrate or CHF, images reviewed by me  DG Chest 2 View [EW]  1354 Currently in sinus rhythm rate 88 to 90/min.  Will confirm twelve-lead EKG.   [EW]  1434 Case was discussed with cardiology Dr. Marlou Porch.  He advises attempt at chemical cardioversion with flecainide, 200 mg oral dose.   [EW]  1435 Findings discussed with the patient.  She would like to try a small amount of water to wet her lips.  Advised her not to drink much.   [EW]  1630 Findings discussed with cardiology, Dr. Marlou Porch he agrees with additional dose of Lopressor, and recommends cardioversion if she has not converted to normal sinus rhythm, by 5 PM.   [EW]    Clinical Course User Index [EW] Daleen Bo, MD     Patient Vitals for the past 24 hrs:  BP Pulse Resp SpO2 Height Weight  04/01/18 1645 122/77 (!) 112 13 96 % - -  04/01/18 1630 123/71 (!) 105 19 97 % - -  04/01/18 1615 137/87 (!) 117 (!) 24 96 % - -  04/01/18 1600 135/84 94 (!) 21 97 % - -  04/01/18 1545 128/75 96 15 98 % - -  04/01/18 1530 122/75 84 13 97 % - -  04/01/18 1515 122/68 84 18 96 % - -  04/01/18 1500 121/75 96 18 97 % - -  04/01/18 1445 138/76 81 16 97 % - -  04/01/18 1430 136/79 94 20 97 % - -  04/01/18 1415 133/68 83 15 94 % - -  04/01/18 1400 123/85 85 (!) 21 96 % - -  04/01/18 1345 135/82 - 14 - - -  04/01/18 1330 125/70 74 14 99 % - -  04/01/18 1315 127/74 83 17 95 % - -  04/01/18 1300 134/81 - 16 - - -  04/01/18 1245 136/85 92 (!) 21 96 % - -  04/01/18 1230 129/78 80 16 99 % - -  04/01/18 1215 135/81 (!) 109 14 100 % - -  04/01/18 1200 124/76 96 18 97 % - -  04/01/18 1145 - - - - 5' 5.5" (1.664 m) 66.7 kg  04/01/18 1145 128/83 88 16 97 % - -  04/01/18 1045 118/88 88 16 96 % - -  04/01/18 1030 (!) 128/94 (!) 126 20  98 % - -  04/01/18 0937 134/77 (!) 120 20 96 % - -    11:25 AM Reevaluation with update and discussion. After initial assessment and treatment, an updated evaluation reveals at this time her heart rate is 110, irregular, palpated right radial, by me.  Patient remains comfortable denies chest pain or shortness of breath at this time.  Additional treatment ordered. Daleen Bo   Medical Decision Making: Palpitations with findings consistent with atrial flutter, rapid rate, variable AV block.  Symptoms and heart rate improved with fluid and Lopressor,  gradually progressed to increased rate.  Patient given flecainide 200 mg orally to attempt chemical cardioversion.  Patient is currently anticoagulated.  Doubt ACS, PE or pneumonia.  Patient is a candidate for DC cardioversion, if she does not convert chemically.  She takes Eliquis regularly.  CRITICAL CARE-yes Performed by: Daleen Bo  Nursing Notes Reviewed/ Care Coordinated Applicable Imaging Reviewed Interpretation of Laboratory Data incorporated into ED treatment  Plan-management to oncoming provider team, Dr. Tyrone Nine, to evaluate and treat after appropriate oral flecainide observation time.  Consider cardioversion if not chemically converted.    Final Clinical Impressions(s) / ED Diagnoses   Final diagnoses:  Atrial flutter with rapid ventricular response [facility])    ED Discharge Orders    None       Daleen Bo, MD 04/01/18 1700

## 2018-04-01 NOTE — ED Notes (Signed)
Patient transported to X-ray made tech aware to transport pt to treatment room.

## 2018-04-01 NOTE — Discharge Instructions (Signed)
Return for chest pain, sob, fast heart rate.

## 2018-04-01 NOTE — ED Provider Notes (Signed)
.Cardioversion Date/Time: 04/01/2018 6:20 PM Performed by: Ashley Etienne, DO Authorized by: Ashley Etienne, DO   Consent:    Consent obtained:  Written   Consent given by:  Patient   Risks discussed:  Death, induced arrhythmia and pain   Alternatives discussed:  No treatment, alternative treatment, rate-control medication, delayed treatment and observation Pre-procedure details:    Cardioversion basis:  Emergent   Rhythm:  Atrial fibrillation   Electrode placement:  Anterior-posterior Patient sedated: Yes. Refer to sedation procedure documentation for details of sedation.  Attempt one:    Cardioversion mode:  Synchronous   Waveform:  Biphasic   Shock (Joules):  200   Shock outcome:  Conversion to normal sinus rhythm Post-procedure details:    Patient status:  Awake   Patient tolerance of procedure:  Tolerated well, no immediate complications .Sedation Date/Time: 04/01/2018 6:21 PM Performed by: Ashley Etienne, DO Authorized by: Ashley Etienne, DO   Consent:    Consent obtained:  Written   Consent given by:  Patient and spouse   Risks discussed:  Allergic reaction, dysrhythmia, inadequate sedation, nausea, vomiting, respiratory compromise necessitating ventilatory assistance and intubation and prolonged hypoxia resulting in organ damage   Alternatives discussed:  Analgesia without sedation and anxiolysis Universal protocol:    Immediately prior to procedure a time out was called: yes     Patient identity confirmation method:  Verbally with patient Indications:    Procedure performed:  Cardioversion   Procedure necessitating sedation performed by:  Physician performing sedation Pre-sedation assessment:    Time since last food or drink:  6   ASA classification: class 2 - patient with mild systemic disease     Neck mobility: normal     Mouth opening:  2 finger widths   Thyromental distance:  3 finger widths   Mallampati score:  I - soft palate, uvula, fauces, pillars visible   Pre-sedation  assessments completed and reviewed: airway patency, cardiovascular function, hydration status, mental status, nausea/vomiting, pain level, respiratory function and temperature   Immediate pre-procedure details:    Reviewed: vital signs     Verified: bag valve mask available, emergency equipment available, intubation equipment available, IV patency confirmed, oxygen available and suction available   Procedure details (see MAR for exact dosages):    Preoxygenation:  Nasal cannula   Sedation:  Etomidate   Analgesia:  None   Intra-procedure monitoring:  Blood pressure monitoring, cardiac monitor, continuous capnometry, continuous pulse oximetry, frequent LOC assessments and frequent vital sign checks   Intra-procedure events: none     Intra-procedure management:  Supplemental oxygen   Total Provider sedation time (minutes):  30 Post-procedure details:    Attendance: Constant attendance by certified staff until patient recovered     Recovery: Patient returned to pre-procedure baseline     Post-sedation assessments completed and reviewed: airway patency, cardiovascular function, hydration status, mental status, nausea/vomiting, pain level, respiratory function and temperature     Patient is stable for discharge or admission: yes     Patient tolerance:  Tolerated well, no immediate complications   I received the patient in signout from Dr. Eulis White, patient is a 68 year old female with a chief complaint of symptomatic atrial fibrillation.  Going on for the past 4 days.  Initial trial of flecainide was unsuccessful.  Patient was electrically cardioverted. D/c home.   CHA2DS2/VAS Stroke Risk Points  Current as of 5 minutes ago     4 >= 2 Points: High Risk  1 - 1.99 Points: Medium Risk  0  Points: Low Risk    This is the only CHA2DS2/VAS Stroke Risk Points available for the past  year.:  Last Change: N/A     Details    This score determines the patient's risk of having a stroke if the  patient has  atrial fibrillation.       Points Metrics  0 Has Congestive Heart Failure:  No    Current as of 5 minutes ago  0 Has Vascular Disease:  No    Current as of 5 minutes ago  0 Has Hypertension:  No    Current as of 5 minutes ago  1 Age:  66    Current as of 5 minutes ago  0 Has Diabetes:  No    Current as of 5 minutes ago  2 Had Stroke:  Yes  Had TIA:  No  Had thromboembolism:  No    Current as of 5 minutes ago  1 Female:  Yes    Current as of 5 minutes ago             Ashley Etienne, DO 04/01/18 1912

## 2018-04-01 NOTE — ED Triage Notes (Signed)
Pt here from home with c/o afib , pt has history of  same pt is on blood thinners and rate control meds and had been taking flecainide  For the past three days

## 2018-04-01 NOTE — Telephone Encounter (Signed)
Paged by answering service, patient went into atrial fibrillation 4 days ago.  Initially intermittent now constant for past 2 days.  Patient is symptomatic with dyspnea and dizziness.  She started taking old flecainide and just got new prescription last night.  Compliant with Eliquis.  During my evaluation patient was going fast in 120s and symptomatic.  Advised to come to ER for further evaluation.

## 2018-04-01 NOTE — ED Notes (Signed)
Patient verbalizes understanding of discharge instructions. Opportunity for questioning and answers were provided. Armband removed by staff, pt discharged from ED. Pt wheeled to lobby. Follow up care reviewed.

## 2018-04-01 NOTE — ED Notes (Signed)
IV very positional. Intermittent flow.

## 2018-04-06 DIAGNOSIS — F908 Attention-deficit hyperactivity disorder, other type: Secondary | ICD-10-CM | POA: Diagnosis not present

## 2018-04-08 ENCOUNTER — Ambulatory Visit (HOSPITAL_COMMUNITY)
Admission: RE | Admit: 2018-04-08 | Discharge: 2018-04-08 | Disposition: A | Discharge status: Home / Self Care | Payer: Medicare Other | Source: Ambulatory Visit | Attending: Nurse Practitioner | Admitting: Nurse Practitioner

## 2018-04-08 ENCOUNTER — Encounter (HOSPITAL_COMMUNITY): Payer: Self-pay | Admitting: Nurse Practitioner

## 2018-04-08 VITALS — BP 136/64 | HR 68 | Ht 65.5 in | Wt 150.0 lb

## 2018-04-08 DIAGNOSIS — Z79899 Other long term (current) drug therapy: Secondary | ICD-10-CM | POA: Diagnosis not present

## 2018-04-08 DIAGNOSIS — Z8249 Family history of ischemic heart disease and other diseases of the circulatory system: Secondary | ICD-10-CM | POA: Diagnosis not present

## 2018-04-08 DIAGNOSIS — Z923 Personal history of irradiation: Secondary | ICD-10-CM | POA: Insufficient documentation

## 2018-04-08 DIAGNOSIS — Z803 Family history of malignant neoplasm of breast: Secondary | ICD-10-CM | POA: Insufficient documentation

## 2018-04-08 DIAGNOSIS — Z800 Family history of malignant neoplasm of digestive organs: Secondary | ICD-10-CM | POA: Diagnosis not present

## 2018-04-08 DIAGNOSIS — Z9221 Personal history of antineoplastic chemotherapy: Secondary | ICD-10-CM | POA: Insufficient documentation

## 2018-04-08 DIAGNOSIS — F988 Other specified behavioral and emotional disorders with onset usually occurring in childhood and adolescence: Secondary | ICD-10-CM | POA: Diagnosis not present

## 2018-04-08 DIAGNOSIS — Z880 Allergy status to penicillin: Secondary | ICD-10-CM | POA: Insufficient documentation

## 2018-04-08 DIAGNOSIS — Z853 Personal history of malignant neoplasm of breast: Secondary | ICD-10-CM | POA: Insufficient documentation

## 2018-04-08 DIAGNOSIS — Z7901 Long term (current) use of anticoagulants: Secondary | ICD-10-CM | POA: Insufficient documentation

## 2018-04-08 DIAGNOSIS — Z8673 Personal history of transient ischemic attack (TIA), and cerebral infarction without residual deficits: Secondary | ICD-10-CM | POA: Insufficient documentation

## 2018-04-08 DIAGNOSIS — G4730 Sleep apnea, unspecified: Secondary | ICD-10-CM | POA: Insufficient documentation

## 2018-04-08 DIAGNOSIS — I480 Paroxysmal atrial fibrillation: Secondary | ICD-10-CM | POA: Insufficient documentation

## 2018-04-08 MED ORDER — FLECAINIDE ACETATE 100 MG PO TABS: ORAL_TABLET | ORAL | 1 refills | Status: DC

## 2018-04-08 NOTE — Progress Notes (Signed)
Primary Care Physician: [name], MD Referring Physician: ER f/u EP: Dr. Marvia Pickles [name] is a 68 y.o. female with a h/o paroxysmal afib s/p ablation 12/2015. She presents   for f/u cardioversion in the ER 1/1. She went back into afib after christmas after discussing a conflict with her sister. She went back on flecainide, that she had used before but  RX was out of date. SInce the cardioversion, and being back on flecainide, she has been staying in regular rhythm.  Today, she denies symptoms of palpitations, chest pain, shortness of breath, orthopnea, PND, lower extremity edema, dizziness, presyncope, syncope, or neurologic sequela. The patient is tolerating medications without difficulties and is otherwise without complaint today.   Past Medical History:  Diagnosis Date  . ADD (attention deficit disorder)   . Cancer [facility]) 2001   left breast, treated with lumpectomy, radiation, and chemotherapy  . Depression   . Mitral valve prolapse   . 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 Paroxysmal atrial fibrillation (HCC)   . Sleep apnea    uses CPAP followed by Dr Maxwell Caul  . Stroke [facility]) 2015   Past Surgical History:  Procedure Laterality Date  . BREAST LUMPECTOMY  2001   malignancy removed  . BREAST LUMPECTOMY WITH RADIOACTIVE SEED LOCALIZATION Right 08/09/2013   benign  . BREAST SURGERY  1993   left cyst removed  . CATARACT EXTRACTION    . ELECTROPHYSIOLOGIC STUDY N/A 01/09/2016   Procedure: Atrial Fibrillation Ablation;  Surgeon: Thompson Grayer, MD;  Location: [place] [facility];  Service: Cardiovascular;  Laterality: N/A;  . OOPHORECTOMY Left   . right knee tibia surgery     2006    Current Outpatient Medications  Medication Sig Dispense Refill  . apixaban (ELIQUIS) 5 MG TABS tablet Take 1 tablet (5 mg total) by mouth 2 (two) times daily. 60 tablet 2  . cetirizine (ZYRTEC) 10 MG tablet Take 10 mg by mouth every evening.    . flecainide (TAMBOCOR) 100 MG tablet TAKE 1  TABLET(100 MG) BY MOUTH TWICE DAILY 180 tablet 1  . lamoTRIgine (LAMICTAL) 200 MG tablet Take 200 mg by mouth at bedtime.   5  . metoprolol succinate (TOPROL-XL) 25 MG 24 hr tablet Take 25 mg by mouth daily with breakfast.     . modafinil (PROVIGIL) 100 MG tablet Take 150 mg by mouth daily before breakfast.    . Omega-3 Fatty Acids (FISH OIL) 1000 MG CAPS Take 1,000 mg by mouth at bedtime.     . pravastatin (PRAVACHOL) 40 MG tablet Take 40 mg by mouth every other day.   12  . vortioxetine HBr (TRINTELLIX) 20 MG TABS tablet Take 20 mg by mouth daily.     No current facility-administered medications for this encounter.     Allergies  Allergen Reactions  . Penicillins Other (See Comments)    From childhood/unknown reaction: DID THE REACTION INVOLVE: Swelling of the face/tongue/throat, SOB, or low BP? Unk Sudden or severe rash/hives, skin peeling, or the inside of the mouth or nose? Unk Did it require medical treatment? Unk When did it last happen?Childhood If all above answers are "NO", may proceed with cephalosporin use.   . Nickel Itching and Rash    Social History   Socioeconomic History  . Marital status: Married    Spouse name: Not on file  . Number of children: 0  . Years of education: Bachelor's  . Highest education level: Not on  file  Occupational History  . Occupation: Retired  Scientific laboratory technician  . Financial resource strain: Not on file  . Food insecurity:    Worry: Not on file    Inability: Not on file  . Transportation needs:    Medical: Not on file    Non-medical: Not on file  Tobacco Use  . Smoking status: Never Smoker  . Smokeless tobacco: Never Used  Substance and Sexual Activity  . Alcohol use: Yes    Alcohol/week: 0.0 standard drinks    Comment: 1-2 times a week (2 beers), sometimes more  . Drug use: No    Comment: tried crack in 1980's and marijuana as teen  . Sexual activity: Yes  Lifestyle  . Physical activity:    Days per week: Not on file     Minutes per session: Not on file  . Stress: Not on file  Relationships  . Social connections:    Talks on phone: Not on file    Gets together: Not on file    Attends religious service: Not on file    Active member of club or organization: Not on file    Attends meetings of clubs or organizations: Not on file    Relationship status: Not on file  . Intimate partner violence:    Fear of current or ex partner: Not on file    Emotionally abused: Not on file    Physically abused: Not on file    Forced sexual activity: Not on file  Other Topics Concern  . Not on file  Social History Narrative   Patient is married.   Patient has a college education.   Patient is left handed.   Patient drinks 1.5 cups of caffeine daily.   Lives in [place].  Retired    Family History  Problem Relation Age of Onset  . Heart disease Mother   . Colon cancer Father   . Breast cancer Paternal Grandmother     ROS- All systems are reviewed and negative except as per the HPI above  Physical Exam: Vitals:   04/08/18 1007  BP: 136/64  Pulse: 68  Weight: 68 kg  Height: 5' 5.5" (1.664 m)   Wt Readings from Last 3 Encounters:  04/08/18 68 kg  04/01/18 66.7 kg  03/12/17 70.8 kg    Labs: Lab Results  Component Value Date   NA 141 04/01/2018   K 4.3 04/01/2018   CL 104 04/01/2018   CO2 27 04/01/2018   GLUCOSE 119 (H) 04/01/2018   BUN 12 04/01/2018   CREATININE 0.82 04/01/2018   CALCIUM 9.5 04/01/2018   Lab Results  Component Value Date   INR 1.11 12/24/2013   Lab Results  Component Value Date   CHOL 186 12/25/2013   HDL 51 12/25/2013   LDLCALC 120 (H) 12/25/2013   TRIG 77 12/25/2013     GEN- The patient is well appearing, alert and oriented x 3 today.   Head- normocephalic, atraumatic Eyes-  Sclera clear, conjunctiva pink Ears- hearing intact Oropharynx- clear Neck- supple, no JVP Lymph- no cervical lymphadenopathy Lungs- Clear to ausculation bilaterally, normal work of  breathing Heart- Regular rate and rhythm, no murmurs, rubs or gallops, PMI not laterally displaced GI- soft, NT, ND, + BS Extremities- no clubbing, cyanosis, or edema MS- no significant deformity or atrophy Skin- no rash or lesion Psych- euthymic mood, full affect Neuro- strength and sensation are intact  EKG-NSR at 68 bpm, pr int 166 ms, qrs int 82 ms, qtc  461 ms    Assessment and Plan: 1. Paroxysmal afib Trigger was a conflict with her sister over the holidays Successful cardioversion Discussed that she can stay on the flecainide until she returns from  a planned cruise, if she wants to minimize chance of afib returning, and then wean off to 50 mg bid for a week and then stop Continue BB without change Continue eliquis 5 mg bid, she is aware not to interrupt this after a cardioversion x 4 weeks  F/u with Dr. Agustin Cree 1/28 afib clinic as needed  Butch Penny C. Tyus Kallam, [facility] [address] [place], [place] [address] [phone]

## 2018-04-13 DIAGNOSIS — G4733 Obstructive sleep apnea (adult) (pediatric): Secondary | ICD-10-CM | POA: Diagnosis not present

## 2018-04-13 DIAGNOSIS — F908 Attention-deficit hyperactivity disorder, other type: Secondary | ICD-10-CM | POA: Diagnosis not present

## 2018-04-20 DIAGNOSIS — F908 Attention-deficit hyperactivity disorder, other type: Secondary | ICD-10-CM | POA: Diagnosis not present

## 2018-04-27 DIAGNOSIS — F908 Attention-deficit hyperactivity disorder, other type: Secondary | ICD-10-CM | POA: Diagnosis not present

## 2018-04-28 ENCOUNTER — Ambulatory Visit: Payer: Medicare Other | Admitting: Cardiology

## 2018-04-28 ENCOUNTER — Other Ambulatory Visit: Payer: Self-pay | Admitting: Cardiology

## 2018-04-28 MED ORDER — METOPROLOL SUCCINATE ER 25 MG PO TB24: 25.0000 mg | ORAL_TABLET | Freq: Every day | ORAL | 0 refills | Status: DC

## 2018-04-28 NOTE — Telephone Encounter (Signed)
1. Which medications need to be refilled? (please list name of each medication and dose if known) Metoprolol ER succinate 25mg  table 24HR once daily  2. Which pharmacy/location (including street and city if local pharmacy) is medication to be sent to?[facility]  3. Do they need a 30 day or 90 day supply? [place]

## 2018-04-28 NOTE — Telephone Encounter (Signed)
Given to Dr. Agustin Cree to review.

## 2018-04-28 NOTE — Telephone Encounter (Signed)
Refill for metoprolol succinate sent to [facility] in [place] as requested per Dr. Agustin Cree. Patient is scheduled to see Dr. Agustin Cree on Friday, 05/01/2018, at 10:20 am in the [facility] office.

## 2018-05-01 ENCOUNTER — Ambulatory Visit (INDEPENDENT_AMBULATORY_CARE_PROVIDER_SITE_OTHER): Payer: Medicare Other | Admitting: Cardiology

## 2018-05-01 ENCOUNTER — Encounter: Payer: Self-pay | Admitting: *Deleted

## 2018-05-01 ENCOUNTER — Encounter: Payer: Self-pay | Admitting: Cardiology

## 2018-05-01 VITALS — BP 138/66 | HR 72 | Ht 65.0 in | Wt 147.1 lb

## 2018-05-01 DIAGNOSIS — Z9989 Dependence on other enabling machines and devices: Secondary | ICD-10-CM

## 2018-05-01 DIAGNOSIS — G4733 Obstructive sleep apnea (adult) (pediatric): Secondary | ICD-10-CM | POA: Diagnosis not present

## 2018-05-01 DIAGNOSIS — I6930 Unspecified sequelae of cerebral infarction: Secondary | ICD-10-CM

## 2018-05-01 DIAGNOSIS — E782 Mixed hyperlipidemia: Secondary | ICD-10-CM | POA: Diagnosis not present

## 2018-05-01 DIAGNOSIS — I480 Paroxysmal atrial fibrillation: Secondary | ICD-10-CM | POA: Diagnosis not present

## 2018-05-01 NOTE — Progress Notes (Signed)
Cardiology Office Note:    Date:  05/01/2018   ID:  Ashley White, DOB 02-27-1951, MRN [id number]  PCP:  [name], MD  Cardiologist:  Jenne Campus, MD    Referring MD: [name], MD   Chief Complaint  Patient presents with  . Follow-up  I had episode of atrial fibrillation  History of Present Illness:    Ashley White is a 68 y.o. female delightful lady who did have atrial fibrillation ablation done years ago.  Since that time she had one episode of atrial fibrillation about a year ago she converted with flecainide flecainide been continued for few days and then discontinue however recently she ended up having another episode of atrial fibrillation that required cardioversion.  She was put back on flecainide she takes 100 mg twice daily since that time there is no more episode of atrial fibrillation.  Overall she is doing well she just recently have some flu and recovering from it denies having any chest pain tightness squeezing pressure burning chest.  She is active she walks on the regular basis she loves to travel, she is getting ready to take a cruise into [place] so [place] can see [place].  Past Medical History:  Diagnosis Date  . ADD (attention deficit disorder)   . Cancer [facility]) 2001   left breast, treated with lumpectomy, radiation, and chemotherapy  . Depression   . Mitral valve prolapse   . Myocarditis ([place])    at age 72  . Paroxysmal atrial fibrillation (HCC)   . Sleep apnea    uses CPAP followed by Dr Maxwell Caul  . Stroke [facility]) 2015    Past Surgical History:  Procedure Laterality Date  . BREAST LUMPECTOMY  2001   malignancy removed  . BREAST LUMPECTOMY WITH RADIOACTIVE SEED LOCALIZATION Right 08/09/2013   benign  . BREAST SURGERY  1993   left cyst removed  . CATARACT EXTRACTION    . ELECTROPHYSIOLOGIC STUDY N/A 01/09/2016   Procedure: Atrial Fibrillation Ablation;  Surgeon: Thompson Grayer, MD;  Location: [place] [facility];  Service: Cardiovascular;   Laterality: N/A;  . OOPHORECTOMY Left   . right knee tibia surgery     2006    Current Medications: Current Meds  Medication Sig  . apixaban (ELIQUIS) 5 MG TABS tablet Take 1 tablet (5 mg total) by mouth 2 (two) times daily.  . cetirizine (ZYRTEC) 10 MG tablet Take 10 mg by mouth every evening.  . flecainide (TAMBOCOR) 100 MG tablet TAKE 1 TABLET(100 MG) BY MOUTH TWICE DAILY  . lamoTRIgine (LAMICTAL) 200 MG tablet Take 200 mg by mouth at bedtime.   . metoprolol succinate (TOPROL-XL) 25 MG 24 hr tablet Take 1 tablet (25 mg total) by mouth daily.  . modafinil (PROVIGIL) 100 MG tablet Take 150 mg by mouth daily before breakfast.  . Omega-3 Fatty Acids (FISH OIL) 1000 MG CAPS Take 1,000 mg by mouth at bedtime.   . pravastatin (PRAVACHOL) 40 MG tablet Take 40 mg by mouth every other day.   . vortioxetine HBr (TRINTELLIX) 20 MG TABS tablet Take 20 mg by mouth daily.     Allergies:   Penicillins and Nickel   Social History   Socioeconomic History  . Marital status: Married    Spouse name: Not on file  . Number of children: 0  . Years of education: Bachelor's  . Highest education level: Not on file  Occupational History  . Occupation: Retired  Scientific laboratory technician  . Financial resource strain:  Not on file  . Food insecurity:    Worry: Not on file    Inability: Not on file  . Transportation needs:    Medical: Not on file    Non-medical: Not on file  Tobacco Use  . Smoking status: Never Smoker  . Smokeless tobacco: Never Used  Substance and Sexual Activity  . Alcohol use: Yes    Alcohol/week: 0.0 standard drinks    Comment: 1-2 times a week (2 beers), sometimes more  . Drug use: No    Comment: tried crack in 1980's and marijuana as teen  . Sexual activity: Yes  Lifestyle  . Physical activity:    Days per week: Not on file    Minutes per session: Not on file  . Stress: Not on file  Relationships  . Social connections:    Talks on phone: Not on file    Gets together: Not on file     Attends religious service: Not on file    Active member of club or organization: Not on file    Attends meetings of clubs or organizations: Not on file    Relationship status: Not on file  Other Topics Concern  . Not on file  Social History Narrative   Patient is married.   Patient has a college education.   Patient is left handed.   Patient drinks 1.5 cups of caffeine daily.   Lives in [place].  Retired     Family History: The patient's family history includes Breast cancer in her paternal grandmother; Colon cancer in her father; Heart disease in her mother. ROS:   Please see the history of present illness.    All 14 point review of systems negative except as described per history of present illness  EKGs/Labs/Other Studies Reviewed:      Recent Labs: 04/01/2018: BUN 12; Creatinine, Ser 0.82; Hemoglobin 14.6; Platelets 253; Potassium 4.3; Sodium 141  Recent Lipid Panel    Component Value Date/Time   CHOL 186 12/25/2013 0251   TRIG 77 12/25/2013 0251   HDL 51 12/25/2013 0251   CHOLHDL 3.6 12/25/2013 0251   VLDL 15 12/25/2013 0251   LDLCALC 120 (H) 12/25/2013 0251    Physical Exam:    VS:  BP 138/66   Pulse 72   Ht 5\' 5"  (1.651 m)   Wt 147 lb 1.3 oz (66.7 kg)   SpO2 98%   BMI 24.48 kg/m     Wt Readings from Last 3 Encounters:  05/01/18 147 lb 1.3 oz (66.7 kg)  04/08/18 150 lb (68 kg)  04/01/18 147 lb (66.7 kg)     GEN:  Well nourished, well developed in no acute distress HEENT: Normal NECK: No JVD; No carotid bruits LYMPHATICS: No lymphadenopathy CARDIAC: RRR, no murmurs, no rubs, no gallops RESPIRATORY:  Clear to auscultation without rales, wheezing or rhonchi  ABDOMEN: Soft, non-tender, non-distended MUSCULOSKELETAL:  No edema; No deformity  SKIN: Warm and dry LOWER EXTREMITIES: no swelling NEUROLOGIC:  Alert and oriented x 3 PSYCHIATRIC:  Normal affect   ASSESSMENT:    1. PAF (paroxysmal atrial fibrillation) (HCC)   2. Paroxysmal atrial  fibrillation ([place])   3. OSA on CPAP   4. Mixed hyperlipidemia   5. Late effect of cerebrovascular accident (CVA)    PLAN:    In order of problems listed above:  1. Paroxysmal atrial fibrillation anticoagulated with chads 2 Vascor equals 4 because of stroke being female and 68 years old will continue.  She is also on  flecainide will get EKG today.  Denies having any dizziness or passing out.  We spent a great deal of time talking about her medications and I explained to her why she is taking flecainide metoprolol also why she is taking anticoagulation.  In the future we may consider stopping flecainide however I am afraid we will have to continue then if she will have a relapse of atrial fibrillation while on flecainide then repeated atrial fibrillation ablation need to be considered. 2. Obstructive sleep apnea.  She use CPAP mask and she is very satisfied with it. 3. Dyslipidemia she is taking Pravachol 40 however last fasting lipid profile that I have is from January 2018.  We will try to contact primary care physician to get copy of her fasting lipid profile. 4. Late effect of CVA.  She does not have any residual changes.  No new problems   Medication Adjustments/Labs and Tests Ordered: Current medicines are reviewed at length with the patient today.  Concerns regarding medicines are outlined above.  No orders of the defined types were placed in this encounter.  Medication changes: No orders of the defined types were placed in this encounter.   Signed, Park Liter, MD, [facility] 05/01/2018 11:24 AM    [place]

## 2018-05-01 NOTE — Patient Instructions (Addendum)
Medication Instructions:  Your physician recommends that you continue on your current medications as directed. Please refer to the Current Medication list given to you today.  If you need a refill on your cardiac medications before your next appointment, please call your pharmacy.   Lab work: None  If you have labs (blood work) drawn today and your tests are completely normal, you will receive your results only by: [name] MyChart Message (if you have MyChart) OR . A paper copy in the mail If you have any lab test that is abnormal or we need to change your treatment, we will call you to review the results.  Testing/Procedures: You had an EKG today.  Follow-Up: At [facility], you and your health needs are our priority.  As part of our continuing mission to provide you with exceptional heart care, we have created designated Provider Care Teams.  These Care Teams include your primary Cardiologist (physician) and Advanced Practice Providers (APPs -  Physician Assistants and Nurse Practitioners) who all work together to provide you with the care you need, when you need it. You will need a follow up appointment on Thursday, 07/16/2018 at 4:20 pm with Dr. Angelena Form at the [facility] office.

## 2018-05-04 DIAGNOSIS — F908 Attention-deficit hyperactivity disorder, other type: Secondary | ICD-10-CM | POA: Diagnosis not present

## 2018-05-06 DIAGNOSIS — H43813 Vitreous degeneration, bilateral: Secondary | ICD-10-CM | POA: Diagnosis not present

## 2018-05-06 DIAGNOSIS — Z961 Presence of intraocular lens: Secondary | ICD-10-CM | POA: Diagnosis not present

## 2018-05-06 DIAGNOSIS — H04123 Dry eye syndrome of bilateral lacrimal glands: Secondary | ICD-10-CM | POA: Diagnosis not present

## 2018-05-11 DIAGNOSIS — F908 Attention-deficit hyperactivity disorder, other type: Secondary | ICD-10-CM | POA: Diagnosis not present

## 2018-05-13 DIAGNOSIS — M25511 Pain in right shoulder: Secondary | ICD-10-CM | POA: Diagnosis not present

## 2018-05-13 DIAGNOSIS — M25552 Pain in left hip: Secondary | ICD-10-CM | POA: Diagnosis not present

## 2018-05-13 DIAGNOSIS — M25551 Pain in right hip: Secondary | ICD-10-CM | POA: Diagnosis not present

## 2018-05-13 DIAGNOSIS — M542 Cervicalgia: Secondary | ICD-10-CM | POA: Diagnosis not present

## 2018-05-18 DIAGNOSIS — F908 Attention-deficit hyperactivity disorder, other type: Secondary | ICD-10-CM | POA: Diagnosis not present

## 2018-05-20 ENCOUNTER — Other Ambulatory Visit: Payer: Self-pay | Admitting: Cardiology

## 2018-06-15 DIAGNOSIS — F908 Attention-deficit hyperactivity disorder, other type: Secondary | ICD-10-CM | POA: Diagnosis not present

## 2018-06-15 DIAGNOSIS — F3181 Bipolar II disorder: Secondary | ICD-10-CM | POA: Diagnosis not present

## 2018-06-19 DIAGNOSIS — G4733 Obstructive sleep apnea (adult) (pediatric): Secondary | ICD-10-CM | POA: Diagnosis not present

## 2018-06-22 DIAGNOSIS — F908 Attention-deficit hyperactivity disorder, other type: Secondary | ICD-10-CM | POA: Diagnosis not present

## 2018-06-22 DIAGNOSIS — F3181 Bipolar II disorder: Secondary | ICD-10-CM | POA: Diagnosis not present

## 2018-06-29 DIAGNOSIS — F908 Attention-deficit hyperactivity disorder, other type: Secondary | ICD-10-CM | POA: Diagnosis not present

## 2018-06-29 DIAGNOSIS — F3181 Bipolar II disorder: Secondary | ICD-10-CM | POA: Diagnosis not present

## 2018-07-02 DIAGNOSIS — F900 Attention-deficit hyperactivity disorder, predominantly inattentive type: Secondary | ICD-10-CM | POA: Diagnosis not present

## 2018-07-02 DIAGNOSIS — F3170 Bipolar disorder, currently in remission, most recent episode unspecified: Secondary | ICD-10-CM | POA: Diagnosis not present

## 2018-07-02 DIAGNOSIS — F4010 Social phobia, unspecified: Secondary | ICD-10-CM | POA: Diagnosis not present

## 2018-07-03 ENCOUNTER — Telehealth: Payer: Self-pay | Admitting: *Deleted

## 2018-07-03 ENCOUNTER — Other Ambulatory Visit: Payer: Self-pay | Admitting: Cardiology

## 2018-07-03 MED ORDER — APIXABAN 5 MG PO TABS: 5.0000 mg | ORAL_TABLET | Freq: Two times a day (BID) | ORAL | 1 refills | Status: DC

## 2018-07-03 NOTE — Telephone Encounter (Deleted)
Virtual Visit Pre-Appointment Phone Call    TELEPHONE CALL NOTE  Ashley White has been deemed a candidate for a follow-up tele-health visit to limit community exposure during the Covid-19 pandemic. I spoke with the patient via phone to ensure availability of phone/video source, confirm preferred email & phone number, and discuss instructions and expectations.  I reminded Ashley White to be prepared with any vital sign and/or heart rhythm information that could potentially be obtained via home monitoring, at the time of her visit. I reminded Ashley White to expect a phone call at the time of her visit if her visit.  Did the patient verbally acknowledge consent to treatment? Craige Cotta, RN 07/03/2018 3:33 PM   CONSENT FOR TELE-HEALTH VISIT - PLEASE REVIEW  I hereby voluntarily request, consent and authorize [facility] and its employed or contracted physicians, physician assistants, nurse practitioners or other licensed health care professionals (the Practitioner), to provide me with telemedicine health care services (the "Services") as deemed necessary by the treating Practitioner. I acknowledge and consent to receive the Services by the Practitioner via telemedicine. I understand that the telemedicine visit will involve communicating with the Practitioner through live audiovisual communication technology and the disclosure of certain medical information by electronic transmission. I acknowledge that I have been given the opportunity to request an in-person assessment or other available alternative prior to the telemedicine visit and am voluntarily participating in the telemedicine visit.  I understand that I have the right to withhold or withdraw my consent to the use of telemedicine in the course of my care at any time, without affecting my right to future care or treatment, and that the Practitioner or I may terminate the telemedicine visit at any time. I understand that I have  the right to inspect all information obtained and/or recorded in the course of the telemedicine visit and may receive copies of available information for a reasonable fee.  I understand that some of the potential risks of receiving the Services via telemedicine include:  [name] Delay or interruption in medical evaluation due to technological equipment failure or disruption; . Information transmitted may not be sufficient (e.g. poor resolution of images) to allow for appropriate medical decision making by the Practitioner; and/or  . In rare instances, security protocols could fail, causing a breach of personal health information.  Furthermore, I acknowledge that it is my responsibility to provide information about my medical history, conditions and care that is complete and accurate to the best of my ability. I acknowledge that Practitioner's advice, recommendations, and/or decision may be based on factors not within their control, such as incomplete or inaccurate data provided by me or distortions of diagnostic images or specimens that may result from electronic transmissions. I understand that the practice of medicine is not an exact science and that Practitioner makes no warranties or guarantees regarding treatment outcomes. I acknowledge that I will receive a copy of this consent concurrently upon execution via email to the email address I last provided but may also request a printed copy by calling the office of [place].    I understand that my insurance will be billed for this visit.   I have read or had this consent read to me. . I understand the contents of this consent, which adequately explains the benefits and risks of the Services being provided via telemedicine.  . I have been provided ample opportunity to ask questions regarding this consent and the Services and  have had my questions answered to my satisfaction. . I give my informed consent for the services to be provided through the use  of telemedicine in my medical care  By participating in this telemedicine visit I agree to the above.

## 2018-07-03 NOTE — Telephone Encounter (Signed)
Dr. Wynonia Lawman pt recently was planning to become a pt of Dr. Angelena Form.  She had an afib episode that took her to the ER in Jan, and had f/u with Dr. Agustin Cree. Pt would prefer to be seen in [place] with [place]. I sent message asking to approve switching by physicians.  She was already scheduled with CM on 4/16.  I have discussed a virtual visit with her and she is in agreement. She has provided verbal consent and has my chart activated.      Virtual Visit Pre-Appointment Phone Call     TELEPHONE CALL NOTE  Rane Dumm Burford has been deemed a candidate for a follow-up tele-health visit to limit community exposure during the Covid-19 pandemic. I spoke with the patient via phone to ensure availability of phone/video source, confirm preferred email & phone number, and discuss instructions and expectations.  I reminded Chelbie Jarnagin Cansler to be prepared with any vital sign and/or heart rhythm information that could potentially be obtained via home monitoring, at the time of her visit. I reminded Germani Gavilanes Staup to expect a phone call at the time of her visit if her visit.  Did the patient verbally acknowledge consent to treatment? Craige Cotta, RN 07/03/2018 3:53 PM

## 2018-07-03 NOTE — Telephone Encounter (Signed)
°*  STAT* If patient is at the pharmacy, call can be transferred to refill team.   1. Which medications need to be refilled? (please list name of each medication and dose if known) Eliquis 5mg  tablet  2. Which pharmacy/location (including street and city if local pharmacy) is medication to be sent to? [facility] on [address] [place]  3. Do they need a 30 day or 90 day supply? [place]

## 2018-07-06 DIAGNOSIS — F3181 Bipolar II disorder: Secondary | ICD-10-CM | POA: Diagnosis not present

## 2018-07-06 DIAGNOSIS — F908 Attention-deficit hyperactivity disorder, other type: Secondary | ICD-10-CM | POA: Diagnosis not present

## 2018-07-07 ENCOUNTER — Telehealth: Payer: Self-pay

## 2018-07-07 NOTE — Telephone Encounter (Signed)
Left message for pt regarding MyChart video visit on 07/08/18.

## 2018-07-07 NOTE — Telephone Encounter (Signed)
Received message back from Dr. Angelena Form recommending pt follow up with Dr. Rayann Heman, since she has seen him in past for her atrial fibrillation.   Spoke with Dr. Jackalyn Lombard scheduler to arrange follow up.

## 2018-07-08 ENCOUNTER — Telehealth (INDEPENDENT_AMBULATORY_CARE_PROVIDER_SITE_OTHER): Payer: Medicare Other | Admitting: Internal Medicine

## 2018-07-08 VITALS — BP 134/65 | HR 64 | Temp 96.9°F

## 2018-07-08 DIAGNOSIS — Z9989 Dependence on other enabling machines and devices: Secondary | ICD-10-CM

## 2018-07-08 DIAGNOSIS — G4733 Obstructive sleep apnea (adult) (pediatric): Secondary | ICD-10-CM | POA: Diagnosis not present

## 2018-07-08 DIAGNOSIS — I480 Paroxysmal atrial fibrillation: Secondary | ICD-10-CM

## 2018-07-08 NOTE — Progress Notes (Signed)
Electrophysiology TeleHealth Note   Due to national recommendations of social distancing due to COVID 19, an audio/video telehealth visit is felt to be most appropriate for this patient at this time.  See MyChart message from today for the patient's consent to telehealth for [facility].   Date:  07/08/2018   ID:  Ashley White, DOB December 28, 1950, MRN [id number]  Location: patient's home  Provider location: [address], [place]  Evaluation Performed: Follow-up visit  PCP:  [name], MD  Cardiologist:  Dr Wynonia Lawman  Electrophysiologist:  Dr Rayann Heman  Chief Complaint:  afib  History of Present Illness:    Ashley White is a 68 y.o. female who presents via audio/video conferencing for a telehealth visit today.  Since last being seen in our clinic, the patient reports doing very well.  She has had only a single episode of afib for which she presented to the ED 04/01/2018 with atypical atrial flutter.  This occurred in the setting of an argument with her sister over the holidays.  She had had some ETOH also.  She required ED cardioversion and was placed back on flecainide and has not had any further afib. Today, she denies symptoms of palpitations, chest pain, shortness of breath,  lower extremity edema, dizziness, presyncope, or syncope.  The patient is otherwise without complaint today.  The patient denies symptoms of fevers, chills, cough, or new SOB worrisome for COVID 19.  Past Medical History:  Diagnosis Date  . ADD (attention deficit disorder)   . Cancer [facility]) 2001   left breast, treated with lumpectomy, radiation, and chemotherapy  . Depression   . Mitral valve prolapse   . 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 Paroxysmal atrial fibrillation (HCC)   . Sleep apnea    uses CPAP followed by Dr Maxwell Caul  . Stroke [facility]) 2015    Past Surgical History:  Procedure Laterality Date  . BREAST LUMPECTOMY  2001   malignancy removed  . BREAST LUMPECTOMY WITH RADIOACTIVE SEED  LOCALIZATION Right 08/09/2013   benign  . BREAST SURGERY  1993   left cyst removed  . CATARACT EXTRACTION    . ELECTROPHYSIOLOGIC STUDY N/A 01/09/2016   Procedure: Atrial Fibrillation Ablation;  Surgeon: Thompson Grayer, MD;  Location: [place] [facility];  Service: Cardiovascular;  Laterality: N/A;  . OOPHORECTOMY Left   . right knee tibia surgery     2006    Current Outpatient Medications  Medication Sig Dispense Refill  . apixaban (ELIQUIS) 5 MG TABS tablet Take 1 tablet (5 mg total) by mouth 2 (two) times daily. 180 tablet 1  . cetirizine (ZYRTEC) 10 MG tablet Take 10 mg by mouth every evening.    . flecainide (TAMBOCOR) 100 MG tablet TAKE 1 TABLET(100 MG) BY MOUTH TWICE DAILY 180 tablet 1  . lamoTRIgine (LAMICTAL) 200 MG tablet Take 200 mg by mouth at bedtime.   5  . metoprolol succinate (TOPROL-XL) 25 MG 24 hr tablet Take 1 tablet (25 mg total) by mouth daily. 90 tablet 0  . modafinil (PROVIGIL) 100 MG tablet Take 150 mg by mouth daily before breakfast.    . Omega-3 Fatty Acids (FISH OIL) 1000 MG CAPS Take 1,000 mg by mouth at bedtime.     . pravastatin (PRAVACHOL) 40 MG tablet Take 40 mg by mouth every other day.   12  . vortioxetine HBr (TRINTELLIX) 20 MG TABS tablet Take 20 mg by mouth daily.  No current facility-administered medications for this visit.     Allergies:   Penicillins and Nickel   Social History:  The patient  reports that she has never smoked. She has never used smokeless tobacco. She reports current alcohol use. She reports that she does not use drugs.   Family History:  The patient's  family history includes Breast cancer in her paternal grandmother; Colon cancer in her father; Heart disease in her mother.   ROS:  Please see the history of present illness.   All other systems are personally reviewed and negative.    Exam:    Vital Signs:  There were no vitals taken for this visit.  Well appearing, alert and conversant, regular work of breathing,  good  skin color Eyes- anicteric, neuro- grossly intact, skin- no apparent rash or lesions or cyanosis, mouth- oral mucosa is pink   Labs/Other Tests and Data Reviewed:    Recent Labs: 04/01/2018: BUN 12; Creatinine, Ser 0.82; Hemoglobin 14.6; Platelets 253; Potassium 4.3; Sodium 141   Wt Readings from Last 3 Encounters:  05/01/18 147 lb 1.3 oz (66.7 kg)  04/08/18 150 lb (68 kg)  04/01/18 147 lb (66.7 kg)     Other studies personally reviewed: Additional studies/ records that were reviewed today include: my prior notes , Dr Wendy Poet prior note Review of the above records today demonstrates: as above Prior radiographs: cxr 04/01/2018 reveals no air space disease   ASSESSMENT & PLAN:    1.  Paroxysmal atrial fibrillation Doing very well post ablation off AAD therapy Continue eliquis long term given prior stroke risk After COVID 19, we will try to wean off her flecainide.  Given concerns of COVID, I would not adjust medicines at this time.  We could consider trying flecainidd 50mg  BID and eventually transitioning to pill in pocket flecainide.  2. OSA Compliance with CPAP  3. 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  Dr Wynonia Lawman evaluted and did not feel that further workup would be required.  4. COVID 19 screen The patient denies symptoms of COVID 19 at this time.  The importance of social distancing was discussed today.  Follow-up:  AF clinic in 6 months  Current medicines are reviewed at length with the patient today.   The patient does not have concerns regarding her medicines.  The following changes were made today:  none  Labs/ tests ordered today include:  No orders of the defined types were placed in this encounter.   Patient Risk:  after full review of this patients clinical status, I feel that they are at moderate risk at this time.  Today, I have spent 20 minutes with the patient with  telehealth technology discussing afib .    Army Fossa, MD  07/08/2018 10:35 AM     [facility] [address] [place] [address] [phone] (office) [phone] (fax)

## 2018-07-13 DIAGNOSIS — F908 Attention-deficit hyperactivity disorder, other type: Secondary | ICD-10-CM | POA: Diagnosis not present

## 2018-07-13 DIAGNOSIS — F3181 Bipolar II disorder: Secondary | ICD-10-CM | POA: Diagnosis not present

## 2018-07-16 ENCOUNTER — Ambulatory Visit: Payer: Medicare Other | Admitting: Cardiovascular Disease

## 2018-07-16 ENCOUNTER — Telehealth: Payer: Medicare Other | Admitting: Cardiovascular Disease

## 2018-07-20 DIAGNOSIS — F3181 Bipolar II disorder: Secondary | ICD-10-CM | POA: Diagnosis not present

## 2018-07-20 DIAGNOSIS — F908 Attention-deficit hyperactivity disorder, other type: Secondary | ICD-10-CM | POA: Diagnosis not present

## 2018-07-23 ENCOUNTER — Telehealth: Payer: Self-pay | Admitting: Cardiology

## 2018-07-23 MED ORDER — PRAVASTATIN SODIUM 40 MG PO TABS: 40.0000 mg | ORAL_TABLET | ORAL | 1 refills | Status: DC

## 2018-07-23 MED ORDER — METOPROLOL SUCCINATE ER 25 MG PO TB24: 25.0000 mg | ORAL_TABLET | Freq: Every day | ORAL | 1 refills | Status: DC

## 2018-07-23 NOTE — Telephone Encounter (Signed)
Toprol XL and Pravastatin refills sent to [facility] on [address] per pt preference

## 2018-07-23 NOTE — Telephone Encounter (Signed)
°*  STAT* If patient is at the pharmacy, call can be transferred to refill team.   1. Which medications need to be refilled? (please list name of each medication and dose if known) metoprolol succinate (TOPROL-XL) 25 MG 24 hr tablet and   pravastatin (PRAVACHOL) 40 MG    2. Which pharmacy/location (including street and city if local pharmacy) is medication to be sent to?  [facility] [place], [place] AT [facility] [phone] (Phone) [phone] (Fax)    3. Do they need a 30 day or 90 day supply? 90 day

## 2018-07-27 DIAGNOSIS — F908 Attention-deficit hyperactivity disorder, other type: Secondary | ICD-10-CM | POA: Diagnosis not present

## 2018-07-27 DIAGNOSIS — F3181 Bipolar II disorder: Secondary | ICD-10-CM | POA: Diagnosis not present

## 2018-08-03 DIAGNOSIS — F908 Attention-deficit hyperactivity disorder, other type: Secondary | ICD-10-CM | POA: Diagnosis not present

## 2018-08-03 DIAGNOSIS — F3181 Bipolar II disorder: Secondary | ICD-10-CM | POA: Diagnosis not present

## 2018-08-10 DIAGNOSIS — F908 Attention-deficit hyperactivity disorder, other type: Secondary | ICD-10-CM | POA: Diagnosis not present

## 2018-08-10 DIAGNOSIS — F3181 Bipolar II disorder: Secondary | ICD-10-CM | POA: Diagnosis not present

## 2018-08-17 DIAGNOSIS — F908 Attention-deficit hyperactivity disorder, other type: Secondary | ICD-10-CM | POA: Diagnosis not present

## 2018-08-17 DIAGNOSIS — F3181 Bipolar II disorder: Secondary | ICD-10-CM | POA: Diagnosis not present

## 2018-08-31 DIAGNOSIS — F3181 Bipolar II disorder: Secondary | ICD-10-CM | POA: Diagnosis not present

## 2018-08-31 DIAGNOSIS — F908 Attention-deficit hyperactivity disorder, other type: Secondary | ICD-10-CM | POA: Diagnosis not present

## 2018-09-07 DIAGNOSIS — F3181 Bipolar II disorder: Secondary | ICD-10-CM | POA: Diagnosis not present

## 2018-09-07 DIAGNOSIS — F908 Attention-deficit hyperactivity disorder, other type: Secondary | ICD-10-CM | POA: Diagnosis not present

## 2018-09-14 DIAGNOSIS — F908 Attention-deficit hyperactivity disorder, other type: Secondary | ICD-10-CM | POA: Diagnosis not present

## 2018-09-14 DIAGNOSIS — F3181 Bipolar II disorder: Secondary | ICD-10-CM | POA: Diagnosis not present

## 2018-09-21 DIAGNOSIS — F908 Attention-deficit hyperactivity disorder, other type: Secondary | ICD-10-CM | POA: Diagnosis not present

## 2018-09-28 DIAGNOSIS — F908 Attention-deficit hyperactivity disorder, other type: Secondary | ICD-10-CM | POA: Diagnosis not present

## 2018-10-05 DIAGNOSIS — F908 Attention-deficit hyperactivity disorder, other type: Secondary | ICD-10-CM | POA: Diagnosis not present

## 2018-10-05 DIAGNOSIS — F3181 Bipolar II disorder: Secondary | ICD-10-CM | POA: Diagnosis not present

## 2018-10-12 DIAGNOSIS — F3181 Bipolar II disorder: Secondary | ICD-10-CM | POA: Diagnosis not present

## 2018-10-12 DIAGNOSIS — F908 Attention-deficit hyperactivity disorder, other type: Secondary | ICD-10-CM | POA: Diagnosis not present

## 2018-10-19 DIAGNOSIS — F908 Attention-deficit hyperactivity disorder, other type: Secondary | ICD-10-CM | POA: Diagnosis not present

## 2018-10-19 DIAGNOSIS — F3181 Bipolar II disorder: Secondary | ICD-10-CM | POA: Diagnosis not present

## 2018-10-26 DIAGNOSIS — F908 Attention-deficit hyperactivity disorder, other type: Secondary | ICD-10-CM | POA: Diagnosis not present

## 2018-10-26 DIAGNOSIS — F3181 Bipolar II disorder: Secondary | ICD-10-CM | POA: Diagnosis not present

## 2018-10-30 DIAGNOSIS — G4733 Obstructive sleep apnea (adult) (pediatric): Secondary | ICD-10-CM | POA: Diagnosis not present

## 2018-11-16 DIAGNOSIS — F908 Attention-deficit hyperactivity disorder, other type: Secondary | ICD-10-CM | POA: Diagnosis not present

## 2018-11-16 DIAGNOSIS — F3181 Bipolar II disorder: Secondary | ICD-10-CM | POA: Diagnosis not present

## 2018-11-23 DIAGNOSIS — F908 Attention-deficit hyperactivity disorder, other type: Secondary | ICD-10-CM | POA: Diagnosis not present

## 2018-11-23 DIAGNOSIS — F3181 Bipolar II disorder: Secondary | ICD-10-CM | POA: Diagnosis not present

## 2018-11-30 DIAGNOSIS — F908 Attention-deficit hyperactivity disorder, other type: Secondary | ICD-10-CM | POA: Diagnosis not present

## 2018-11-30 DIAGNOSIS — F3181 Bipolar II disorder: Secondary | ICD-10-CM | POA: Diagnosis not present

## 2018-12-14 DIAGNOSIS — F3181 Bipolar II disorder: Secondary | ICD-10-CM | POA: Diagnosis not present

## 2018-12-14 DIAGNOSIS — F908 Attention-deficit hyperactivity disorder, other type: Secondary | ICD-10-CM | POA: Diagnosis not present

## 2018-12-21 DIAGNOSIS — F900 Attention-deficit hyperactivity disorder, predominantly inattentive type: Secondary | ICD-10-CM | POA: Diagnosis not present

## 2018-12-21 DIAGNOSIS — F4010 Social phobia, unspecified: Secondary | ICD-10-CM | POA: Diagnosis not present

## 2018-12-21 DIAGNOSIS — F3170 Bipolar disorder, currently in remission, most recent episode unspecified: Secondary | ICD-10-CM | POA: Diagnosis not present

## 2018-12-21 DIAGNOSIS — F3181 Bipolar II disorder: Secondary | ICD-10-CM | POA: Diagnosis not present

## 2018-12-21 DIAGNOSIS — F908 Attention-deficit hyperactivity disorder, other type: Secondary | ICD-10-CM | POA: Diagnosis not present

## 2018-12-28 DIAGNOSIS — F3181 Bipolar II disorder: Secondary | ICD-10-CM | POA: Diagnosis not present

## 2018-12-28 DIAGNOSIS — F908 Attention-deficit hyperactivity disorder, other type: Secondary | ICD-10-CM | POA: Diagnosis not present

## 2019-01-04 DIAGNOSIS — F908 Attention-deficit hyperactivity disorder, other type: Secondary | ICD-10-CM | POA: Diagnosis not present

## 2019-01-04 DIAGNOSIS — F3181 Bipolar II disorder: Secondary | ICD-10-CM | POA: Diagnosis not present

## 2019-01-05 ENCOUNTER — Encounter (HOSPITAL_COMMUNITY): Payer: Self-pay | Admitting: Nurse Practitioner

## 2019-01-05 ENCOUNTER — Ambulatory Visit (HOSPITAL_COMMUNITY)
Admission: RE | Admit: 2019-01-05 | Discharge: 2019-01-05 | Disposition: A | Discharge status: Home / Self Care | Payer: Medicare Other | Source: Ambulatory Visit | Attending: Nurse Practitioner | Admitting: Nurse Practitioner

## 2019-01-05 ENCOUNTER — Other Ambulatory Visit: Payer: Self-pay

## 2019-01-05 VITALS — BP 132/60 | HR 65 | Ht 65.0 in | Wt 155.4 lb

## 2019-01-05 DIAGNOSIS — I341 Nonrheumatic mitral (valve) prolapse: Secondary | ICD-10-CM | POA: Diagnosis not present

## 2019-01-05 DIAGNOSIS — F329 Major depressive disorder, single episode, unspecified: Secondary | ICD-10-CM | POA: Diagnosis not present

## 2019-01-05 DIAGNOSIS — G4730 Sleep apnea, unspecified: Secondary | ICD-10-CM | POA: Diagnosis not present

## 2019-01-05 DIAGNOSIS — Z803 Family history of malignant neoplasm of breast: Secondary | ICD-10-CM | POA: Diagnosis not present

## 2019-01-05 DIAGNOSIS — Z7901 Long term (current) use of anticoagulants: Secondary | ICD-10-CM | POA: Insufficient documentation

## 2019-01-05 DIAGNOSIS — Z8249 Family history of ischemic heart disease and other diseases of the circulatory system: Secondary | ICD-10-CM | POA: Diagnosis not present

## 2019-01-05 DIAGNOSIS — Z79899 Other long term (current) drug therapy: Secondary | ICD-10-CM | POA: Diagnosis not present

## 2019-01-05 DIAGNOSIS — Z800 Family history of malignant neoplasm of digestive organs: Secondary | ICD-10-CM | POA: Diagnosis not present

## 2019-01-05 DIAGNOSIS — Z8673 Personal history of transient ischemic attack (TIA), and cerebral infarction without residual deficits: Secondary | ICD-10-CM | POA: Diagnosis not present

## 2019-01-05 DIAGNOSIS — I480 Paroxysmal atrial fibrillation: Secondary | ICD-10-CM | POA: Diagnosis not present

## 2019-01-05 DIAGNOSIS — Z91048 Other nonmedicinal substance allergy status: Secondary | ICD-10-CM | POA: Diagnosis not present

## 2019-01-05 DIAGNOSIS — Z853 Personal history of malignant neoplasm of breast: Secondary | ICD-10-CM | POA: Diagnosis not present

## 2019-01-05 DIAGNOSIS — Z880 Allergy status to penicillin: Secondary | ICD-10-CM | POA: Diagnosis not present

## 2019-01-05 DIAGNOSIS — Z9010 Acquired absence of unspecified breast and nipple: Secondary | ICD-10-CM | POA: Insufficient documentation

## 2019-01-05 LAB — BASIC METABOLIC PANEL
Anion gap: 12 (ref 5–15)
BUN: 17 mg/dL (ref 8–23)
CO2: 25 mmol/L (ref 22–32)
Calcium: 9.3 mg/dL (ref 8.9–10.3)
Chloride: 102 mmol/L (ref 98–111)
Creatinine, Ser: 0.68 mg/dL (ref 0.44–1.00)
GFR calc Af Amer: >60 mL/min (ref >60)
GFR calc non Af Amer: >60 mL/min (ref >60)
Glucose, Bld: 86 mg/dL (ref 70–99)
Potassium: 3.9 mmol/L (ref 3.5–5.1)
Sodium: 139 mmol/L (ref 135–145)

## 2019-01-05 LAB — CBC
HCT: 44.3 % (ref 36.0–46.0)
Hemoglobin: 14.5 g/dL (ref 12.0–15.0)
MCH: 31 pg (ref 26.0–34.0)
MCHC: 32.7 g/dL (ref 30.0–36.0)
MCV: 94.7 fL (ref 80.0–100.0)
Platelets: 239 10*3/uL (ref 150–400)
RBC: 4.68 MIL/uL (ref 3.87–5.11)
RDW: 12.1 % (ref 11.5–15.5)
WBC: 5.7 10*3/uL (ref 4.0–10.5)
nRBC: 0 % (ref 0.0–0.2)

## 2019-01-05 MED ORDER — APIXABAN 5 MG PO TABS: 5.0000 mg | ORAL_TABLET | Freq: Two times a day (BID) | ORAL | 2 refills | Status: DC

## 2019-01-05 NOTE — Progress Notes (Signed)
Primary Care Physician: [name], MD Referring Physician: ER f/u EP: Dr. Marvia Pickles Sammons is a 68 y.o. female with a h/o paroxysmal afib s/p ablation 12/2015. She presents  for f/u. She remains on flecainide. Dr. Rayann Heman said that she could wean off but she would prefer to stay on it as she has not had any afib. No bleeding issues with eliquis.  Today, she denies symptoms of palpitations, chest pain, shortness of breath, orthopnea, PND, lower extremity edema, dizziness, presyncope, syncope, or neurologic sequela. The patient is tolerating medications without difficulties and is otherwise without complaint today.   Past Medical History:  Diagnosis Date  . ADD (attention deficit disorder)   . Cancer [facility]) 2001   left breast, treated with lumpectomy, radiation, and chemotherapy  . Depression   . Mitral valve prolapse   . 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 Paroxysmal atrial fibrillation (HCC)   . Sleep apnea    uses CPAP followed by Dr Maxwell Caul  . Stroke [facility]) 2015   Past Surgical History:  Procedure Laterality Date  . BREAST LUMPECTOMY  2001   malignancy removed  . BREAST LUMPECTOMY WITH RADIOACTIVE SEED LOCALIZATION Right 08/09/2013   benign  . BREAST SURGERY  1993   left cyst removed  . CATARACT EXTRACTION    . ELECTROPHYSIOLOGIC STUDY N/A 01/09/2016   Procedure: Atrial Fibrillation Ablation;  Surgeon: Thompson Grayer, MD;  Location: [place] [facility];  Service: Cardiovascular;  Laterality: N/A;  . OOPHORECTOMY Left   . right knee tibia surgery     2006    Current Outpatient Medications  Medication Sig Dispense Refill  . apixaban (ELIQUIS) 5 MG TABS tablet Take 1 tablet (5 mg total) by mouth 2 (two) times daily. 180 tablet 1  . cetirizine (ZYRTEC) 10 MG tablet Take 10 mg by mouth every evening.    . flecainide (TAMBOCOR) 100 MG tablet TAKE 1 TABLET(100 MG) BY MOUTH TWICE DAILY 180 tablet 1  . lamoTRIgine (LAMICTAL) 200 MG tablet Take 200 mg by mouth at bedtime.   5  .  metoprolol succinate (TOPROL-XL) 25 MG 24 hr tablet Take 1 tablet (25 mg total) by mouth daily. 90 tablet 1  . modafinil (PROVIGIL) 100 MG tablet Take 150 mg by mouth daily before breakfast.    . Omega-3 Fatty Acids (FISH OIL) 1000 MG CAPS Take 1,000 mg by mouth at bedtime.     . pravastatin (PRAVACHOL) 40 MG tablet Take 1 tablet (40 mg total) by mouth every other day. 45 tablet 1  . vortioxetine HBr (TRINTELLIX) 20 MG TABS tablet Take 20 mg by mouth daily.     No current facility-administered medications for this encounter.     Allergies  Allergen Reactions  . Penicillins Other (See Comments)    From childhood/unknown reaction: DID THE REACTION INVOLVE: Swelling of the face/tongue/throat, SOB, or low BP? Unk Sudden or severe rash/hives, skin peeling, or the inside of the mouth or nose? Unk Did it require medical treatment? Unk When did it last happen?Childhood If all above answers are "NO", may proceed with cephalosporin use.   . Nickel Itching and Rash    Social History   Socioeconomic History  . Marital status: Married    Spouse name: Not on file  . Number of children: 0  . Years of education: Bachelor's  . Highest education level: Not on file  Occupational History  . Occupation: Retired  Scientific laboratory technician  . Financial resource strain:  Not on file  . Food insecurity    Worry: Not on file    Inability: Not on file  . Transportation needs    Medical: Not on file    Non-medical: Not on file  Tobacco Use  . Smoking status: Never Smoker  . Smokeless tobacco: Never Used  Substance and Sexual Activity  . Alcohol use: Yes    Alcohol/week: 0.0 standard drinks    Comment: 1-2 times a week (2 beers), sometimes more  . Drug use: No    Comment: tried crack in 1980's and marijuana as teen  . Sexual activity: Yes  Lifestyle  . Physical activity    Days per week: Not on file    Minutes per session: Not on file  . Stress: Not on file  Relationships  . Social Product manager on phone: Not on file    Gets together: Not on file    Attends religious service: Not on file    Active member of club or organization: Not on file    Attends meetings of clubs or organizations: Not on file    Relationship status: Not on file  . Intimate partner violence    Fear of current or ex partner: Not on file    Emotionally abused: Not on file    Physically abused: Not on file    Forced sexual activity: Not on file  Other Topics Concern  . Not on file  Social History Narrative   Patient is married.   Patient has a college education.   Patient is left handed.   Patient drinks 1.5 cups of caffeine daily.   Lives in [place].  Retired    Family History  Problem Relation Age of Onset  . Heart disease Mother   . Colon cancer Father   . Breast cancer Paternal Grandmother     ROS- All systems are reviewed and negative except as per the HPI above  Physical Exam: Vitals:   01/05/19 1046  BP: 132/60  Pulse: 65  Weight: 70.5 kg  Height: 5\' 5"  (1.651 m)   Wt Readings from Last 3 Encounters:  01/05/19 70.5 kg  05/01/18 66.7 kg  04/08/18 68 kg    Labs: Lab Results  Component Value Date   NA 141 04/01/2018   K 4.3 04/01/2018   CL 104 04/01/2018   CO2 27 04/01/2018   GLUCOSE 119 (H) 04/01/2018   BUN 12 04/01/2018   CREATININE 0.82 04/01/2018   CALCIUM 9.5 04/01/2018   Lab Results  Component Value Date   INR 1.11 12/24/2013   Lab Results  Component Value Date   CHOL 186 12/25/2013   HDL 51 12/25/2013   LDLCALC 120 (H) 12/25/2013   TRIG 77 12/25/2013     GEN- The patient is well appearing, alert and oriented x 3 today.   Head- normocephalic, atraumatic Eyes-  Sclera clear, conjunctiva pink Ears- hearing intact Oropharynx- clear Neck- supple, no JVP Lymph- no cervical lymphadenopathy Lungs- Clear to ausculation bilaterally, normal work of breathing Heart- Regular rate and rhythm, no murmurs, rubs or gallops, PMI not laterally displaced GI-  soft, NT, ND, + BS Extremities- no clubbing, cyanosis, or edema MS- no significant deformity or atrophy Skin- no rash or lesion Psych- euthymic mood, full affect Neuro- strength and sensation are intact  EKG-NSR at 65 bpm, pr int 170 ms, qrs int 80 ms, qtc 461 ms    Assessment and Plan: 1. Paroxysmal afib S/p ablation 2017 She wants  to continue on flecainide 100 mg bid as she is not having any afib Continue BB without change Continue  Metoprolol 25 mg daily  Continue eliquis 5 mg bid  for a CHA2DS2VASc score of 4 Cbc/bmet today   afib clinic  In 6 months  Butch Penny C. Jaylenn Altier, [facility] [address] [place], [place] [address] [phone]

## 2019-01-08 ENCOUNTER — Other Ambulatory Visit (HOSPITAL_COMMUNITY): Payer: Self-pay | Admitting: *Deleted

## 2019-01-08 MED ORDER — METOPROLOL SUCCINATE ER 25 MG PO TB24: 25.0000 mg | ORAL_TABLET | Freq: Every day | ORAL | 2 refills | Status: DC

## 2019-01-11 DIAGNOSIS — F3181 Bipolar II disorder: Secondary | ICD-10-CM | POA: Diagnosis not present

## 2019-01-11 DIAGNOSIS — F908 Attention-deficit hyperactivity disorder, other type: Secondary | ICD-10-CM | POA: Diagnosis not present

## 2019-01-18 DIAGNOSIS — F908 Attention-deficit hyperactivity disorder, other type: Secondary | ICD-10-CM | POA: Diagnosis not present

## 2019-01-18 DIAGNOSIS — F3181 Bipolar II disorder: Secondary | ICD-10-CM | POA: Diagnosis not present

## 2019-01-19 DIAGNOSIS — Z23 Encounter for immunization: Secondary | ICD-10-CM | POA: Diagnosis not present

## 2019-01-25 DIAGNOSIS — F3181 Bipolar II disorder: Secondary | ICD-10-CM | POA: Diagnosis not present

## 2019-01-25 DIAGNOSIS — F908 Attention-deficit hyperactivity disorder, other type: Secondary | ICD-10-CM | POA: Diagnosis not present

## 2019-02-01 DIAGNOSIS — F3181 Bipolar II disorder: Secondary | ICD-10-CM | POA: Diagnosis not present

## 2019-02-01 DIAGNOSIS — F908 Attention-deficit hyperactivity disorder, other type: Secondary | ICD-10-CM | POA: Diagnosis not present

## 2019-02-05 DIAGNOSIS — L304 Erythema intertrigo: Secondary | ICD-10-CM | POA: Diagnosis not present

## 2019-02-05 DIAGNOSIS — D225 Melanocytic nevi of trunk: Secondary | ICD-10-CM | POA: Diagnosis not present

## 2019-02-05 DIAGNOSIS — Z1283 Encounter for screening for malignant neoplasm of skin: Secondary | ICD-10-CM | POA: Diagnosis not present

## 2019-02-08 DIAGNOSIS — F3181 Bipolar II disorder: Secondary | ICD-10-CM | POA: Diagnosis not present

## 2019-02-08 DIAGNOSIS — F908 Attention-deficit hyperactivity disorder, other type: Secondary | ICD-10-CM | POA: Diagnosis not present

## 2019-02-10 DIAGNOSIS — Z1231 Encounter for screening mammogram for malignant neoplasm of breast: Secondary | ICD-10-CM | POA: Diagnosis not present

## 2019-02-10 DIAGNOSIS — Z853 Personal history of malignant neoplasm of breast: Secondary | ICD-10-CM | POA: Diagnosis not present

## 2019-02-11 IMAGING — DX DG CERVICAL SPINE 2 OR 3 VIEWS
3 series · 3 of 3 positions shown · non-contrast
Comparison: MRI 12/26/2013. CT 12/25/2013. Chest x-ray 11/24/2013 .

CLINICAL DATA: Chronic pain.  No recent injury.

EXAM:
CERVICAL SPINE - 2-3 VIEW

[dg cervical spine 2 or 3 views (1 of 3)]
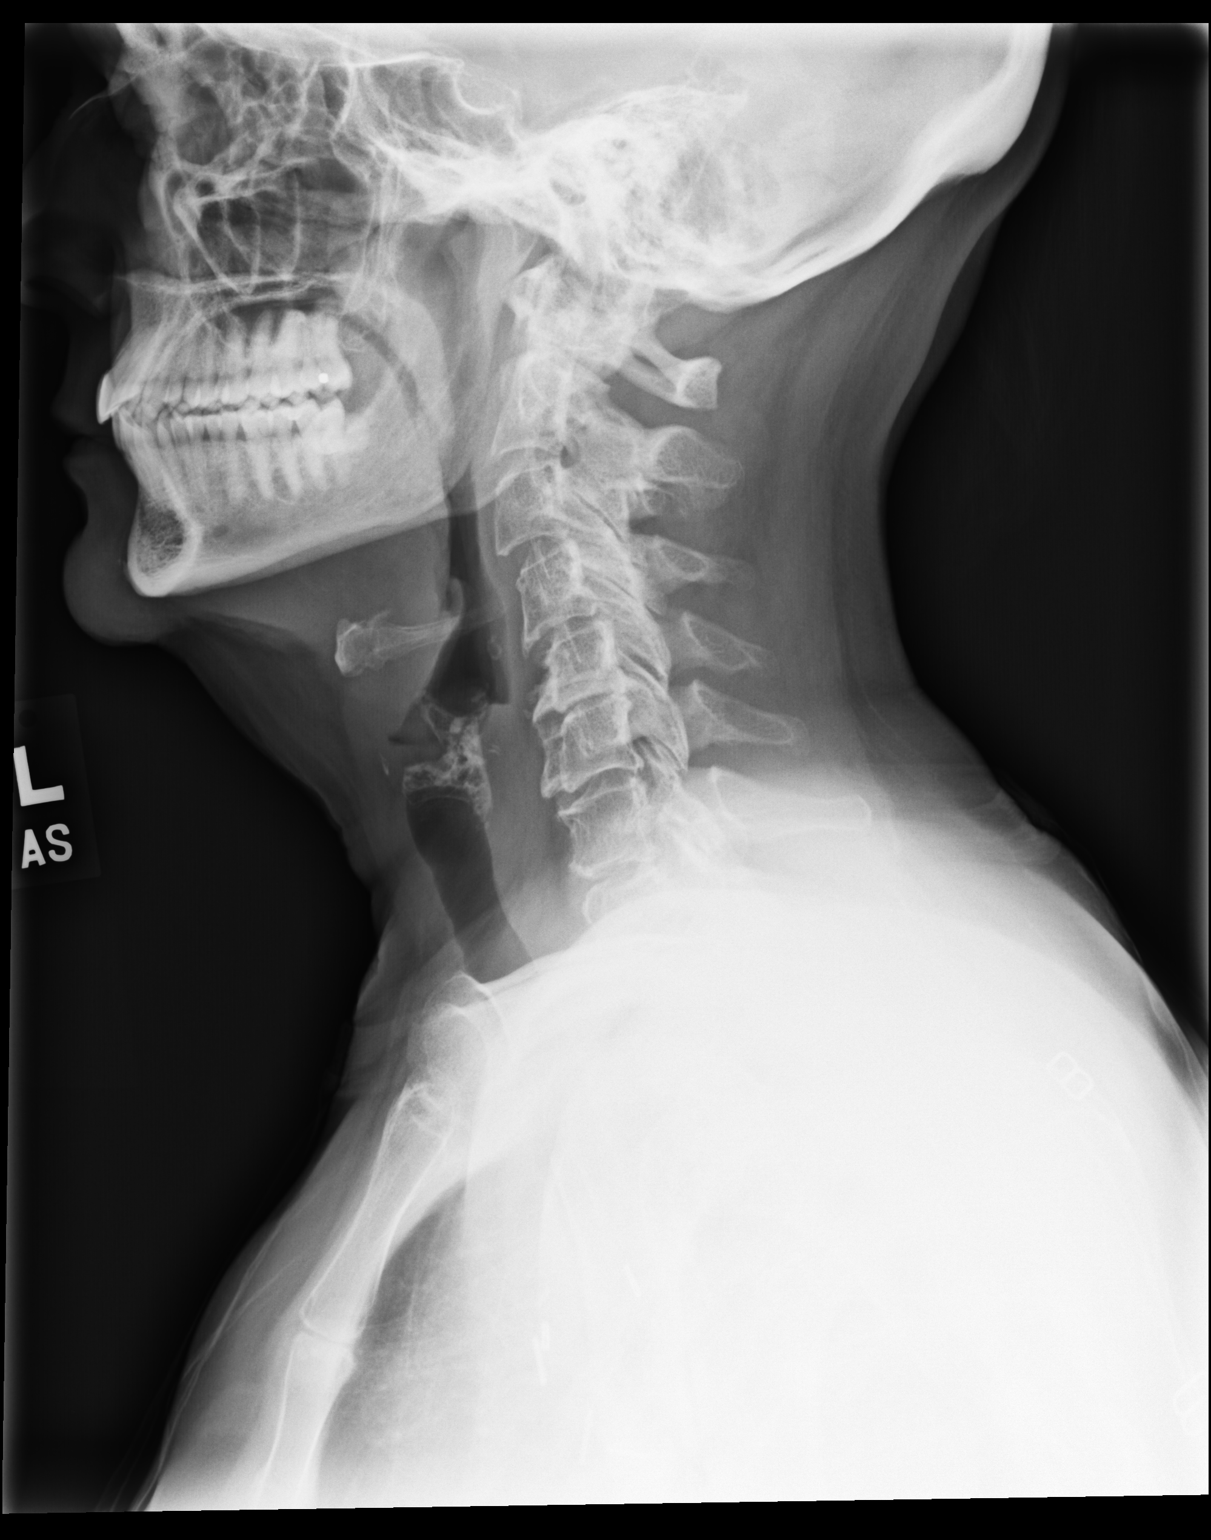

[dg cervical spine 2 or 3 views (2 of 3)]
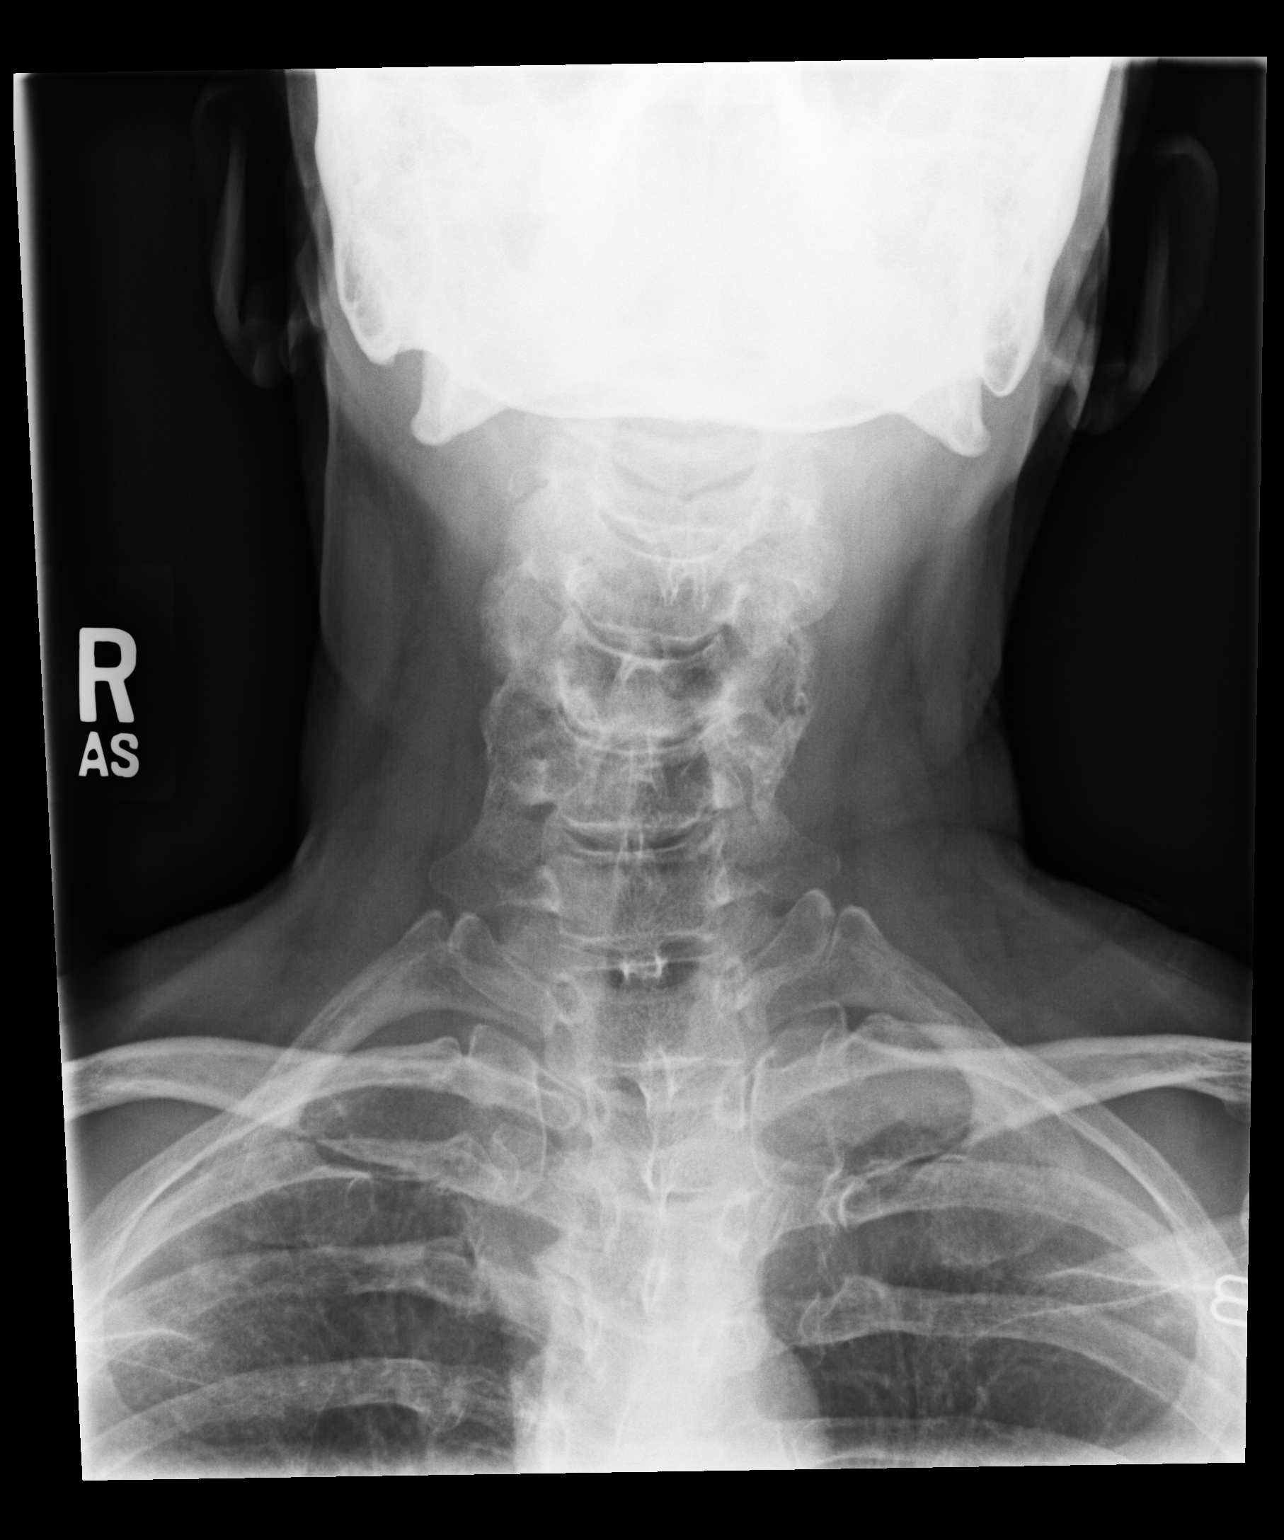

[dg cervical spine 2 or 3 views (3 of 3)]
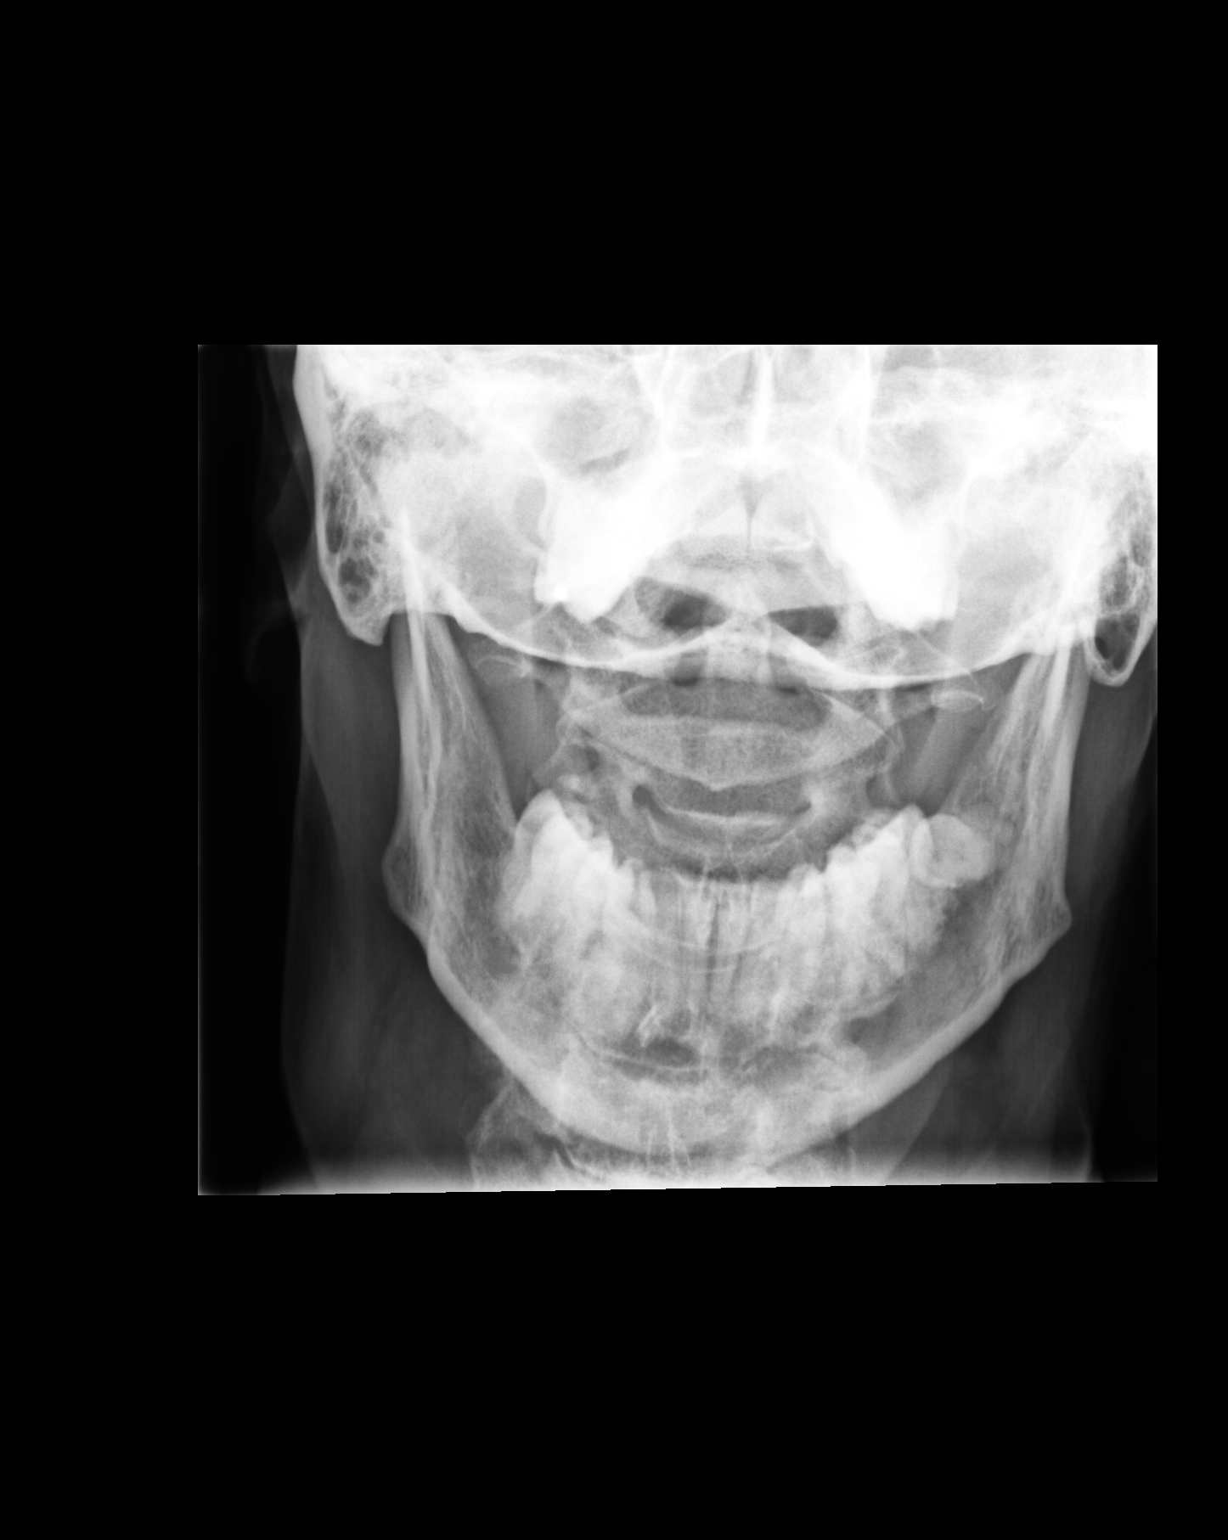

[3 of 3 positions shown; findings below may reference images not displayed]

FINDINGS: Diffuse severe multi level degenerative change with multilevel disc
space loss and endplate osteophyte formation. Severe disc space loss
is noted C5-C6 . C3-C4 and C4-C5 3 mm anterolisthesis. No acute bony
abnormality identified. Stable biapical pleural thickening
consistent with scarring.
IMPRESSION: Severe diffuse degenerative change with 3 mm anterolisthesis C3-C4
and 3 mm anterolisthesis C4-C5. No acute bony abnormality
identified. No evidence of fracture or dislocation.

## 2019-02-15 DIAGNOSIS — F908 Attention-deficit hyperactivity disorder, other type: Secondary | ICD-10-CM | POA: Diagnosis not present

## 2019-02-15 DIAGNOSIS — F3181 Bipolar II disorder: Secondary | ICD-10-CM | POA: Diagnosis not present

## 2019-02-22 DIAGNOSIS — F3181 Bipolar II disorder: Secondary | ICD-10-CM | POA: Diagnosis not present

## 2019-02-22 DIAGNOSIS — F908 Attention-deficit hyperactivity disorder, other type: Secondary | ICD-10-CM | POA: Diagnosis not present

## 2019-03-01 DIAGNOSIS — F3181 Bipolar II disorder: Secondary | ICD-10-CM | POA: Diagnosis not present

## 2019-03-01 DIAGNOSIS — F908 Attention-deficit hyperactivity disorder, other type: Secondary | ICD-10-CM | POA: Diagnosis not present

## 2019-03-02 DIAGNOSIS — G4733 Obstructive sleep apnea (adult) (pediatric): Secondary | ICD-10-CM | POA: Diagnosis not present

## 2019-03-08 DIAGNOSIS — F908 Attention-deficit hyperactivity disorder, other type: Secondary | ICD-10-CM | POA: Diagnosis not present

## 2019-03-08 DIAGNOSIS — F3181 Bipolar II disorder: Secondary | ICD-10-CM | POA: Diagnosis not present

## 2019-03-11 ENCOUNTER — Other Ambulatory Visit: Payer: Self-pay | Admitting: Cardiology

## 2019-03-15 DIAGNOSIS — F908 Attention-deficit hyperactivity disorder, other type: Secondary | ICD-10-CM | POA: Diagnosis not present

## 2019-03-15 DIAGNOSIS — F3181 Bipolar II disorder: Secondary | ICD-10-CM | POA: Diagnosis not present

## 2019-04-14 DIAGNOSIS — G4733 Obstructive sleep apnea (adult) (pediatric): Secondary | ICD-10-CM | POA: Diagnosis not present

## 2019-04-22 ENCOUNTER — Ambulatory Visit: Payer: Medicare Other | Attending: Internal Medicine

## 2019-04-22 DIAGNOSIS — Z23 Encounter for immunization: Secondary | ICD-10-CM | POA: Insufficient documentation

## 2019-04-22 NOTE — Progress Notes (Signed)
   Covid-19 Vaccination Clinic  Name:  [name]    MRN: [id number] DOB: October 24, 1950  04/22/2019  [name] was observed post Covid-19 immunization for 15 minutes without incidence. She was provided with Vaccine Information Sheet and instruction to access the V-Safe system.   [name] was instructed to call 911 with any severe reactions post vaccine: [name] Difficulty breathing  . Swelling of your face and throat  . A fast heartbeat  . A bad rash all over your body  . Dizziness and weakness    Immunizations Administered    Name Date Dose VIS Date Route   Pfizer COVID-19 Vaccine 04/22/2019  3:48 PM 0.3 mL 03/12/2019 Intramuscular   Manufacturer: [place]   Lot: [id number]   [place]: [id number]

## 2019-04-23 DIAGNOSIS — L720 Epidermal cyst: Secondary | ICD-10-CM | POA: Diagnosis not present

## 2019-04-26 ENCOUNTER — Other Ambulatory Visit (HOSPITAL_COMMUNITY): Payer: Self-pay | Admitting: Nurse Practitioner

## 2019-05-05 DIAGNOSIS — L720 Epidermal cyst: Secondary | ICD-10-CM | POA: Diagnosis not present

## 2019-05-05 DIAGNOSIS — L708 Other acne: Secondary | ICD-10-CM | POA: Diagnosis not present

## 2019-05-13 ENCOUNTER — Ambulatory Visit: Payer: Medicare Other | Attending: Internal Medicine

## 2019-05-13 DIAGNOSIS — Z23 Encounter for immunization: Secondary | ICD-10-CM | POA: Insufficient documentation

## 2019-05-13 NOTE — Progress Notes (Signed)
   Covid-19 Vaccination Clinic  Name:  Ashley White    MRN: [id number] DOB: 03-20-1951  05/13/2019  [name] was observed post Covid-19 immunization for 15 minutes without incidence. She was provided with Vaccine Information Sheet and instruction to access the V-Safe system.   [name] was instructed to call 911 with any severe reactions post vaccine: [name] Difficulty breathing  . Swelling of your face and throat  . A fast heartbeat  . A bad rash all over your body  . Dizziness and weakness    Immunizations Administered    Name Date Dose VIS Date Route   Pfizer COVID-19 Vaccine 05/13/2019  4:55 PM 0.3 mL 03/12/2019 Intramuscular   Manufacturer: [place]   Lot: [id number]   [place]: [id number]

## 2019-05-22 ENCOUNTER — Other Ambulatory Visit: Payer: Self-pay | Admitting: Cardiology

## 2019-05-24 ENCOUNTER — Other Ambulatory Visit: Payer: Self-pay | Admitting: Cardiology

## 2019-06-14 DIAGNOSIS — F329 Major depressive disorder, single episode, unspecified: Secondary | ICD-10-CM | POA: Diagnosis not present

## 2019-07-01 DIAGNOSIS — G4733 Obstructive sleep apnea (adult) (pediatric): Secondary | ICD-10-CM | POA: Diagnosis not present

## 2019-07-08 ENCOUNTER — Ambulatory Visit (HOSPITAL_COMMUNITY)
Admission: RE | Admit: 2019-07-08 | Discharge: 2019-07-08 | Disposition: A | Discharge status: Home / Self Care | Payer: Medicare Other | Source: Ambulatory Visit | Attending: Nurse Practitioner | Admitting: Nurse Practitioner

## 2019-07-08 ENCOUNTER — Other Ambulatory Visit: Payer: Self-pay

## 2019-07-08 ENCOUNTER — Encounter (HOSPITAL_COMMUNITY): Payer: Self-pay | Admitting: Nurse Practitioner

## 2019-07-08 VITALS — BP 166/76 | HR 79 | Ht 65.0 in | Wt 165.4 lb

## 2019-07-08 DIAGNOSIS — I480 Paroxysmal atrial fibrillation: Secondary | ICD-10-CM | POA: Diagnosis not present

## 2019-07-08 DIAGNOSIS — Z79899 Other long term (current) drug therapy: Secondary | ICD-10-CM | POA: Diagnosis not present

## 2019-07-08 DIAGNOSIS — F988 Other specified behavioral and emotional disorders with onset usually occurring in childhood and adolescence: Secondary | ICD-10-CM | POA: Insufficient documentation

## 2019-07-08 DIAGNOSIS — D6869 Other thrombophilia: Secondary | ICD-10-CM

## 2019-07-08 DIAGNOSIS — R9431 Abnormal electrocardiogram [ECG] [EKG]: Secondary | ICD-10-CM | POA: Insufficient documentation

## 2019-07-08 DIAGNOSIS — Z8673 Personal history of transient ischemic attack (TIA), and cerebral infarction without residual deficits: Secondary | ICD-10-CM | POA: Insufficient documentation

## 2019-07-08 DIAGNOSIS — Z7901 Long term (current) use of anticoagulants: Secondary | ICD-10-CM | POA: Insufficient documentation

## 2019-07-08 DIAGNOSIS — F329 Major depressive disorder, single episode, unspecified: Secondary | ICD-10-CM | POA: Diagnosis not present

## 2019-07-08 NOTE — Progress Notes (Addendum)
Primary Care Physician: [name], MD Referring Physician: ER f/u EP: Dr. Marvia Pickles [name] is a 69 y.o. female with a h/o paroxysmal afib s/p ablation 12/2015. She presents  for f/u. She remains on flecainide. Dr. Rayann Heman said that she could wean off but she would prefer to stay on it as she has not had any afib. No bleeding issues with eliquis.  F/u in afib clinic, 07/08/19. She  remains in SR on flecainide. No afib awareness. No bleeding issues with eliquis with a CHA2DS2VASc score of at least 4. Continues tx of OSA with use of cpap. She did not take meds this am as she forgot in her haste to get out the door for this appointment and BP is elevated. With meds at home, systolic is around [id number].   Today, she denies symptoms of palpitations, chest pain, shortness of breath, orthopnea, PND, lower extremity edema, dizziness, presyncope, syncope, or neurologic sequela. The patient is tolerating medications without difficulties and is otherwise without complaint today.   Past Medical History:  Diagnosis Date  . ADD (attention deficit disorder)   . Cancer [facility]) 2001   left breast, treated with lumpectomy, radiation, and chemotherapy  . Depression   . Mitral valve prolapse   . 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 Paroxysmal atrial fibrillation (HCC)   . Sleep apnea    uses CPAP followed by Dr Maxwell Caul  . Stroke [facility]) 2015   Past Surgical History:  Procedure Laterality Date  . BREAST LUMPECTOMY  2001   malignancy removed  . BREAST LUMPECTOMY WITH RADIOACTIVE SEED LOCALIZATION Right 08/09/2013   benign  . BREAST SURGERY  1993   left cyst removed  . CATARACT EXTRACTION    . ELECTROPHYSIOLOGIC STUDY N/A 01/09/2016   Procedure: Atrial Fibrillation Ablation;  Surgeon: Thompson Grayer, MD;  Location: [place] [facility];  Service: Cardiovascular;  Laterality: N/A;  . OOPHORECTOMY Left   . right knee tibia surgery     2006    Current Outpatient Medications  Medication Sig Dispense Refill  .  apixaban (ELIQUIS) 5 MG TABS tablet Take 1 tablet (5 mg total) by mouth 2 (two) times daily. 180 tablet 2  . cetirizine (ZYRTEC) 10 MG tablet Take 10 mg by mouth every evening.    . flecainide (TAMBOCOR) 100 MG tablet TAKE 1 TABLET BY MOUTH TWICE DAILY 180 tablet 0  . lamoTRIgine (LAMICTAL) 200 MG tablet Take 200 mg by mouth at bedtime.   5  . metoprolol succinate (TOPROL-XL) 25 MG 24 hr tablet Take 1 tablet (25 mg total) by mouth daily. 90 tablet 2  . modafinil (PROVIGIL) 100 MG tablet Take 150 mg by mouth daily before breakfast.    . Omega-3 Fatty Acids (FISH OIL) 1000 MG CAPS Take 1,000 mg by mouth at bedtime.     . pravastatin (PRAVACHOL) 40 MG tablet Take 1 tablet (40 mg total) by mouth daily. PLEASE CALL OFFICE TO SCHEDULE APPOINTMENT FOR FURTHER REFILLS. 45 tablet 0  . vortioxetine HBr (TRINTELLIX) 20 MG TABS tablet Take 20 mg by mouth daily.     No current facility-administered medications for this encounter.    Allergies  Allergen Reactions  . Penicillins Other (See Comments)    From childhood/unknown reaction: DID THE REACTION INVOLVE: Swelling of the face/tongue/throat, SOB, or low BP? Unk Sudden or severe rash/hives, skin peeling, or the inside of the mouth or nose? Unk Did it require medical treatment? Unk When did it last  happen?Childhood If all above answers are "NO", may proceed with cephalosporin use.   . Nickel Itching and Rash    Social History   Socioeconomic History  . Marital status: Married    Spouse name: Not on file  . Number of children: 0  . Years of education: Bachelor's  . Highest education level: Not on file  Occupational History  . Occupation: Retired  Tobacco Use  . Smoking status: Never Smoker  . Smokeless tobacco: Never Used  Substance and Sexual Activity  . Alcohol use: Yes    Alcohol/week: 0.0 standard drinks    Comment: 1-2 times a week (2 beers), sometimes more  . Drug use: No    Comment: tried crack in 1980's and marijuana as teen   . Sexual activity: Yes  Other Topics Concern  . Not on file  Social History Narrative   Patient is married.   Patient has a college education.   Patient is left handed.   Patient drinks 1.5 cups of caffeine daily.   Lives in [place].  Retired   Scientist, physiological Strain:   . Difficulty of Paying Living Expenses:   Food Insecurity:   . Worried About Charity fundraiser in the Last Year:   . Arboriculturist in the Last Year:   Transportation Needs:   . Film/video editor (Medical):   [name] Lack of Transportation (Non-Medical):   Physical Activity:   . Days of Exercise per Week:   . Minutes of Exercise per Session:   Stress:   . Feeling of Stress :   Social Connections:   . Frequency of Communication with Friends and Family:   . Frequency of Social Gatherings with Friends and Family:   . Attends Religious Services:   . Active Member of Clubs or Organizations:   . Attends Archivist Meetings:   [name] Marital Status:   Intimate Partner Violence:   . Fear of Current or Ex-Partner:   . Emotionally Abused:   [name] Physically Abused:   . Sexually Abused:     Family History  Problem Relation Age of Onset  . Heart disease Mother   . Colon cancer Father   . Breast cancer Paternal Grandmother     ROS- All systems are reviewed and negative except as per the HPI above  Physical Exam: There were no vitals filed for this visit. Wt Readings from Last 3 Encounters:  01/05/19 155 lb 6.4 oz (70.5 kg)  05/01/18 147 lb 1.3 oz (66.7 kg)  04/08/18 150 lb (68 kg)    Labs: Lab Results  Component Value Date   NA 139 01/05/2019   K 3.9 01/05/2019   CL 102 01/05/2019   CO2 25 01/05/2019   GLUCOSE 86 01/05/2019   BUN 17 01/05/2019   CREATININE 0.68 01/05/2019   CALCIUM 9.3 01/05/2019   Lab Results  Component Value Date   INR 1.11 12/24/2013   Lab Results  Component Value Date   CHOL 186 12/25/2013   HDL 51 12/25/2013   LDLCALC 120  (H) 12/25/2013   TRIG 77 12/25/2013     GEN- The patient is well appearing, alert and oriented x 3 today.   Head- normocephalic, atraumatic Eyes-  Sclera clear, conjunctiva pink Ears- hearing intact Oropharynx- clear Neck- supple, no JVP Lymph- no cervical lymphadenopathy Lungs- Clear to ausculation bilaterally, normal work of breathing Heart- Regular rate and rhythm, no murmurs, rubs or gallops, PMI not laterally displaced  GI- soft, NT, ND, + BS Extremities- no clubbing, cyanosis, or edema MS- no significant deformity or atrophy Skin- no rash or lesion Psych- euthymic mood, full affect Neuro- strength and sensation are intact  EKG-NSR at 79 bpm, pr int 164 ms, qrs int 84 ms, qtc 460 ms    Assessment and Plan: 1. Paroxysmal afib S/p ablation 2017 No afib to report Continue on flecainide 100 mg bid  Continue metoprolol xl 25 mg  without change Continue eliquis 5 mg bid  for a CHA2DS2VASc score of 4  afib clinic in 6 months  Butch Penny C. Siddhi Dornbush, [facility] [address] [place], [place] [address] [phone]

## 2019-07-08 NOTE — Addendum Note (Signed)
Encounter addended by: Sherran Needs, NP on: 07/08/2019 11:01 AM  Actions taken: Clinical Note Signed

## 2019-07-19 ENCOUNTER — Other Ambulatory Visit: Payer: Self-pay | Admitting: Cardiology

## 2019-07-20 ENCOUNTER — Other Ambulatory Visit: Payer: Self-pay | Admitting: Cardiology

## 2019-07-21 ENCOUNTER — Other Ambulatory Visit (HOSPITAL_COMMUNITY): Payer: Self-pay | Admitting: Nurse Practitioner

## 2019-08-18 ENCOUNTER — Other Ambulatory Visit: Payer: Self-pay | Admitting: Cardiology

## 2019-08-18 NOTE — Telephone Encounter (Signed)
Rx refill sent to pharmacy. 

## 2019-08-20 ENCOUNTER — Other Ambulatory Visit (HOSPITAL_COMMUNITY): Payer: Self-pay | Admitting: Nurse Practitioner

## 2019-09-17 ENCOUNTER — Other Ambulatory Visit (HOSPITAL_COMMUNITY): Payer: Self-pay | Admitting: Nurse Practitioner

## 2019-09-20 DIAGNOSIS — F908 Attention-deficit hyperactivity disorder, other type: Secondary | ICD-10-CM | POA: Diagnosis not present

## 2019-09-20 DIAGNOSIS — F3181 Bipolar II disorder: Secondary | ICD-10-CM | POA: Diagnosis not present

## 2019-09-28 DIAGNOSIS — F3181 Bipolar II disorder: Secondary | ICD-10-CM | POA: Diagnosis not present

## 2019-09-28 DIAGNOSIS — F908 Attention-deficit hyperactivity disorder, other type: Secondary | ICD-10-CM | POA: Diagnosis not present

## 2019-10-04 DIAGNOSIS — F908 Attention-deficit hyperactivity disorder, other type: Secondary | ICD-10-CM | POA: Diagnosis not present

## 2019-10-04 DIAGNOSIS — F3181 Bipolar II disorder: Secondary | ICD-10-CM | POA: Diagnosis not present

## 2019-10-12 DIAGNOSIS — F908 Attention-deficit hyperactivity disorder, other type: Secondary | ICD-10-CM | POA: Diagnosis not present

## 2019-10-12 DIAGNOSIS — F3181 Bipolar II disorder: Secondary | ICD-10-CM | POA: Diagnosis not present

## 2019-10-19 DIAGNOSIS — F3181 Bipolar II disorder: Secondary | ICD-10-CM | POA: Diagnosis not present

## 2019-10-19 DIAGNOSIS — F908 Attention-deficit hyperactivity disorder, other type: Secondary | ICD-10-CM | POA: Diagnosis not present

## 2019-10-26 DIAGNOSIS — F908 Attention-deficit hyperactivity disorder, other type: Secondary | ICD-10-CM | POA: Diagnosis not present

## 2019-10-26 DIAGNOSIS — F3181 Bipolar II disorder: Secondary | ICD-10-CM | POA: Diagnosis not present

## 2019-11-08 DIAGNOSIS — F3181 Bipolar II disorder: Secondary | ICD-10-CM | POA: Diagnosis not present

## 2019-11-08 DIAGNOSIS — F908 Attention-deficit hyperactivity disorder, other type: Secondary | ICD-10-CM | POA: Diagnosis not present

## 2019-11-16 DIAGNOSIS — F908 Attention-deficit hyperactivity disorder, other type: Secondary | ICD-10-CM | POA: Diagnosis not present

## 2019-11-16 DIAGNOSIS — F3181 Bipolar II disorder: Secondary | ICD-10-CM | POA: Diagnosis not present

## 2019-11-22 DIAGNOSIS — F908 Attention-deficit hyperactivity disorder, other type: Secondary | ICD-10-CM | POA: Diagnosis not present

## 2019-11-22 DIAGNOSIS — M546 Pain in thoracic spine: Secondary | ICD-10-CM | POA: Diagnosis not present

## 2019-11-22 DIAGNOSIS — R0781 Pleurodynia: Secondary | ICD-10-CM | POA: Diagnosis not present

## 2019-11-22 DIAGNOSIS — F3181 Bipolar II disorder: Secondary | ICD-10-CM | POA: Diagnosis not present

## 2019-11-23 DIAGNOSIS — S39012A Strain of muscle, fascia and tendon of lower back, initial encounter: Secondary | ICD-10-CM | POA: Diagnosis not present

## 2019-11-23 DIAGNOSIS — M549 Dorsalgia, unspecified: Secondary | ICD-10-CM | POA: Diagnosis not present

## 2019-11-29 DIAGNOSIS — M546 Pain in thoracic spine: Secondary | ICD-10-CM | POA: Diagnosis not present

## 2019-11-29 DIAGNOSIS — F908 Attention-deficit hyperactivity disorder, other type: Secondary | ICD-10-CM | POA: Diagnosis not present

## 2019-11-29 DIAGNOSIS — F3181 Bipolar II disorder: Secondary | ICD-10-CM | POA: Diagnosis not present

## 2019-11-29 DIAGNOSIS — M228X9 Other disorders of patella, unspecified knee: Secondary | ICD-10-CM | POA: Diagnosis not present

## 2019-11-29 DIAGNOSIS — M79621 Pain in right upper arm: Secondary | ICD-10-CM | POA: Diagnosis not present

## 2019-12-01 ENCOUNTER — Other Ambulatory Visit: Payer: Self-pay | Admitting: Cardiology

## 2019-12-07 DIAGNOSIS — F3181 Bipolar II disorder: Secondary | ICD-10-CM | POA: Diagnosis not present

## 2019-12-07 DIAGNOSIS — F908 Attention-deficit hyperactivity disorder, other type: Secondary | ICD-10-CM | POA: Diagnosis not present

## 2019-12-20 DIAGNOSIS — F329 Major depressive disorder, single episode, unspecified: Secondary | ICD-10-CM | POA: Diagnosis not present

## 2019-12-20 DIAGNOSIS — G4730 Sleep apnea, unspecified: Secondary | ICD-10-CM | POA: Diagnosis not present

## 2019-12-20 DIAGNOSIS — F908 Attention-deficit hyperactivity disorder, other type: Secondary | ICD-10-CM | POA: Diagnosis not present

## 2019-12-20 DIAGNOSIS — F3181 Bipolar II disorder: Secondary | ICD-10-CM | POA: Diagnosis not present

## 2019-12-27 DIAGNOSIS — F908 Attention-deficit hyperactivity disorder, other type: Secondary | ICD-10-CM | POA: Diagnosis not present

## 2019-12-27 DIAGNOSIS — F3181 Bipolar II disorder: Secondary | ICD-10-CM | POA: Diagnosis not present

## 2020-01-03 DIAGNOSIS — F908 Attention-deficit hyperactivity disorder, other type: Secondary | ICD-10-CM | POA: Diagnosis not present

## 2020-01-03 DIAGNOSIS — F3181 Bipolar II disorder: Secondary | ICD-10-CM | POA: Diagnosis not present

## 2020-01-06 ENCOUNTER — Ambulatory Visit (HOSPITAL_COMMUNITY): Payer: Medicare Other | Admitting: Nurse Practitioner

## 2020-01-10 DIAGNOSIS — F3181 Bipolar II disorder: Secondary | ICD-10-CM | POA: Diagnosis not present

## 2020-01-10 DIAGNOSIS — F908 Attention-deficit hyperactivity disorder, other type: Secondary | ICD-10-CM | POA: Diagnosis not present

## 2020-01-13 ENCOUNTER — Encounter (HOSPITAL_COMMUNITY): Payer: Self-pay | Admitting: Nurse Practitioner

## 2020-01-13 ENCOUNTER — Ambulatory Visit (HOSPITAL_COMMUNITY)
Admission: RE | Admit: 2020-01-13 | Discharge: 2020-01-13 | Disposition: A | Discharge status: Home / Self Care | Payer: Medicare Other | Source: Ambulatory Visit | Attending: Nurse Practitioner | Admitting: Nurse Practitioner

## 2020-01-13 ENCOUNTER — Telehealth: Payer: Self-pay | Admitting: Cardiology

## 2020-01-13 ENCOUNTER — Other Ambulatory Visit: Payer: Self-pay

## 2020-01-13 VITALS — BP 176/78 | HR 64 | Ht 65.0 in | Wt 162.8 lb

## 2020-01-13 DIAGNOSIS — Z79899 Other long term (current) drug therapy: Secondary | ICD-10-CM | POA: Insufficient documentation

## 2020-01-13 DIAGNOSIS — F329 Major depressive disorder, single episode, unspecified: Secondary | ICD-10-CM | POA: Diagnosis not present

## 2020-01-13 DIAGNOSIS — F988 Other specified behavioral and emotional disorders with onset usually occurring in childhood and adolescence: Secondary | ICD-10-CM | POA: Insufficient documentation

## 2020-01-13 DIAGNOSIS — Z7901 Long term (current) use of anticoagulants: Secondary | ICD-10-CM | POA: Insufficient documentation

## 2020-01-13 DIAGNOSIS — G4733 Obstructive sleep apnea (adult) (pediatric): Secondary | ICD-10-CM | POA: Diagnosis not present

## 2020-01-13 DIAGNOSIS — Z8673 Personal history of transient ischemic attack (TIA), and cerebral infarction without residual deficits: Secondary | ICD-10-CM | POA: Diagnosis not present

## 2020-01-13 DIAGNOSIS — I480 Paroxysmal atrial fibrillation: Secondary | ICD-10-CM | POA: Diagnosis not present

## 2020-01-13 DIAGNOSIS — D6869 Other thrombophilia: Secondary | ICD-10-CM

## 2020-01-13 NOTE — Progress Notes (Signed)
Primary Care Physician: [name], MD Referring Physician: ER f/u EP: Dr. Marvia Pickles Ashley White is a 69 y.o. female with a h/o paroxysmal afib s/p ablation 12/2015. She presents  for f/u. She remains on flecainide. Dr. Rayann Heman said that she could wean off but she would prefer to stay on it as she has not had any afib. No bleeding issues with eliquis.  F/u in afib clinic,01/13/20. She feels well and reports no afib. She continues on flecainide. No bleeding issues with eliquis. Ekg shows SR. BP elevated on presentation, rechecked at 138/78. She is requesting thru the prompting of her PCP to have a general cardiologist follow her.   Today, she denies symptoms of palpitations, chest pain, shortness of breath, orthopnea, PND, lower extremity edema, dizziness, presyncope, syncope, or neurologic sequela. The patient is tolerating medications without difficulties and is otherwise without complaint today.   Past Medical History:  Diagnosis Date  . ADD (attention deficit disorder)   . Cancer [facility]) 2001   left breast, treated with lumpectomy, radiation, and chemotherapy  . Depression   . Mitral valve prolapse   . 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 Paroxysmal atrial fibrillation (HCC)   . Sleep apnea    uses CPAP followed by Dr Maxwell Caul  . Stroke [facility]) 2015   Past Surgical History:  Procedure Laterality Date  . BREAST LUMPECTOMY  2001   malignancy removed  . BREAST LUMPECTOMY WITH RADIOACTIVE SEED LOCALIZATION Right 08/09/2013   benign  . BREAST SURGERY  1993   left cyst removed  . CATARACT EXTRACTION    . ELECTROPHYSIOLOGIC STUDY N/A 01/09/2016   Procedure: Atrial Fibrillation Ablation;  Surgeon: Thompson Grayer, MD;  Location: [facility];  Service: Cardiovascular;  Laterality: N/A;  . OOPHORECTOMY Left   . right knee tibia surgery     2006    Current Outpatient Medications  Medication Sig Dispense Refill  . cetirizine (ZYRTEC) 10 MG tablet Take 10 mg by mouth every evening.      [name] ELIQUIS 5 MG TABS tablet TAKE 1 TABLET(5 MG) BY MOUTH TWICE DAILY 180 tablet 2  . flecainide (TAMBOCOR) 100 MG tablet TAKE 1 TABLET BY MOUTH TWICE DAILY 180 tablet 1  . fluticasone (CUTIVATE) 0.05 % cream APPLY TO AFFECTED AREA UPTO TWICE DAILY AS NEEDED    . ketoconazole (NIZORAL) 2 % cream APPLY TO AFFECTED AREA UPTO TWICE DAILY AS NEEDED    . lamoTRIgine (LAMICTAL) 200 MG tablet Take 200 mg by mouth at bedtime.   5  . metoprolol succinate (TOPROL-XL) 25 MG 24 hr tablet TAKE 1 TABLET(25 MG) BY MOUTH DAILY 90 tablet 2  . modafinil (PROVIGIL) 100 MG tablet Take 150 mg by mouth daily before breakfast.    . Omega-3 Fatty Acids (FISH OIL) 1000 MG CAPS Take 1,000 mg by mouth at bedtime.     . pravastatin (PRAVACHOL) 40 MG tablet TAKE 1 TABLET(40 MG) BY MOUTH DAILY 15 tablet 0  . vortioxetine HBr (TRINTELLIX) 20 MG TABS tablet Take 20 mg by mouth 4 (four) times a week.      No current facility-administered medications for this encounter.    Allergies  Allergen Reactions  . Penicillins Other (See Comments)    From childhood/unknown reaction: DID THE REACTION INVOLVE: Swelling of the face/tongue/throat, SOB, or low BP? Unk Sudden or severe rash/hives, skin peeling, or the inside of the mouth or nose? Unk Did it require medical treatment? Unk When did it  last happen?Childhood If all above answers are "NO", may proceed with cephalosporin use.   . Nickel Itching and Rash    Social History   Socioeconomic History  . Marital status: Married    Spouse name: Not on file  . Number of children: 0  . Years of education: Bachelor's  . Highest education level: Not on file  Occupational History  . Occupation: Retired  Tobacco Use  . Smoking status: Never Smoker  . Smokeless tobacco: Never Used  Vaping Use  . Vaping Use: Never used  Substance and Sexual Activity  . Alcohol use: Yes    Alcohol/week: 0.0 standard drinks    Comment: 1-2 times a week (2 beers), sometimes more  . Drug  use: No    Comment: tried crack in 1980's and marijuana as teen  . Sexual activity: Yes  Other Topics Concern  . Not on file  Social History Narrative   Patient is married.   Patient has a college education.   Patient is left handed.   Patient drinks 1.5 cups of caffeine daily.   Lives in [place].  Retired   Scientist, physiological Strain:   . Difficulty of Paying Living Expenses: Not on file  Food Insecurity:   . Worried About Charity fundraiser in the Last Year: Not on file  . Ran Out of Food in the Last Year: Not on file  Transportation Needs:   . Lack of Transportation (Medical): Not on file  . Lack of Transportation (Non-Medical): Not on file  Physical Activity:   . Days of Exercise per Week: Not on file  . Minutes of Exercise per Session: Not on file  Stress:   . Feeling of Stress : Not on file  Social Connections:   . Frequency of Communication with Friends and Family: Not on file  . Frequency of Social Gatherings with Friends and Family: Not on file  . Attends Religious Services: Not on file  . Active Member of Clubs or Organizations: Not on file  . Attends Archivist Meetings: Not on file  . Marital Status: Not on file  Intimate Partner Violence:   . Fear of Current or Ex-Partner: Not on file  . Emotionally Abused: Not on file  . Physically Abused: Not on file  . Sexually Abused: Not on file    Family History  Problem Relation Age of Onset  . Heart disease Mother   . Colon cancer Father   . Breast cancer Paternal Grandmother     ROS- All systems are reviewed and negative except as per the HPI above  Physical Exam: Vitals:   01/13/20 1337  Weight: 73.8 kg  Height: 5\' 5"  (1.651 m)   Wt Readings from Last 3 Encounters:  01/13/20 73.8 kg  07/08/19 75 kg  01/05/19 70.5 kg    Labs: Lab Results  Component Value Date   NA 139 01/05/2019   K 3.9 01/05/2019   CL 102 01/05/2019   CO2 25 01/05/2019   GLUCOSE 86  01/05/2019   BUN 17 01/05/2019   CREATININE 0.68 01/05/2019   CALCIUM 9.3 01/05/2019   Lab Results  Component Value Date   INR 1.11 12/24/2013   Lab Results  Component Value Date   CHOL 186 12/25/2013   HDL 51 12/25/2013   LDLCALC 120 (H) 12/25/2013   TRIG 77 12/25/2013     GEN- The patient is well appearing, alert and oriented x 3 today.  Head- normocephalic, atraumatic Eyes-  Sclera clear, conjunctiva pink Ears- hearing intact Oropharynx- clear Neck- supple, no JVP Lymph- no cervical lymphadenopathy Lungs- Clear to ausculation bilaterally, normal work of breathing Heart- Regular rate and rhythm, no murmurs, rubs or gallops, PMI not laterally displaced GI- soft, NT, ND, + BS Extremities- no clubbing, cyanosis, or edema MS- no significant deformity or atrophy Skin- no rash or lesion Psych- euthymic mood, full affect Neuro- strength and sensation are intact  EKG- Normal EKG at 64 bpm, pr int  162 bpm, qrs int 84 ms, qtc 449 ms   Assessment and Plan: 1. Paroxysmal afib S/p ablation 2017 Continue  flecainide 100 mg bid, after ablation was her desire to continue drug Continue  Metoprolol 25 mg daily  Continue eliquis 5 mg bid  for a CHA2DS2VASc score of 4 Pending physical labs next week with PCP  Pt states that her PCP would rather her to be followed by general cardiology as she has had a prior stroke Will place a request to cardiology pool   Geroge Baseman. Mosetta Ferdinand, [facility] [address] [place], [place] [address] [phone]

## 2020-01-13 NOTE — Telephone Encounter (Signed)
Patient is requesting to switch providers from Dr. Agustin Cree to Dr. Angelena Form. Please advise.

## 2020-01-14 NOTE — Telephone Encounter (Signed)
It looks like her only cardiac problem is atrial fibrillation. She has been seen in [place] by Dr. Rayann Heman and in the atrial fib clinic. If the [facility] offices are more convenient for her than , I would suggest that she switch to Dr. Rayann Heman who can manage her atrial fibrillation here in [place].  Gerald Stabs

## 2020-01-17 ENCOUNTER — Other Ambulatory Visit (HOSPITAL_COMMUNITY): Payer: Self-pay | Admitting: Nurse Practitioner

## 2020-01-17 DIAGNOSIS — F3181 Bipolar II disorder: Secondary | ICD-10-CM | POA: Diagnosis not present

## 2020-01-17 DIAGNOSIS — F908 Attention-deficit hyperactivity disorder, other type: Secondary | ICD-10-CM | POA: Diagnosis not present

## 2020-01-17 NOTE — Telephone Encounter (Signed)
Fine with me

## 2020-01-20 NOTE — Telephone Encounter (Signed)
OK by me.   [place] T. Audie Box, [place]  [address], [place],  [address] [phone]  11:31 AM

## 2020-01-20 NOTE — Telephone Encounter (Signed)
Pt decided to switch to Dr. Audie Box in [place].

## 2020-01-21 DIAGNOSIS — G4733 Obstructive sleep apnea (adult) (pediatric): Secondary | ICD-10-CM | POA: Diagnosis not present

## 2020-01-21 DIAGNOSIS — D6869 Other thrombophilia: Secondary | ICD-10-CM | POA: Diagnosis not present

## 2020-01-21 DIAGNOSIS — Z1159 Encounter for screening for other viral diseases: Secondary | ICD-10-CM | POA: Diagnosis not present

## 2020-01-21 DIAGNOSIS — Z0000 Encounter for general adult medical examination without abnormal findings: Secondary | ICD-10-CM | POA: Diagnosis not present

## 2020-01-21 DIAGNOSIS — I480 Paroxysmal atrial fibrillation: Secondary | ICD-10-CM | POA: Diagnosis not present

## 2020-01-23 NOTE — Progress Notes (Signed)
Cardiology Office Note:   Date:  01/25/2020  NAME:  Ashley White    MRN: [id number] DOB:  02-10-1951   PCP:  [name], MD  Cardiologist:  No primary care provider on file.   Referring MD: Sherran Needs, NP   Chief Complaint  Patient presents with  . Atrial Fibrillation   History of Present Illness:   Ashley White is a 69 y.o. female with a hx of paroxysmal Afib s/p ablation, stroke, OSA who presents for follow-up. Transferring to follow with [place].  She reports she is doing well.  She had an A. fib ablation number of years ago.  She apparently had a recurrence and was placed on flecainide.  She is tolerating flecainide 100 mg twice a day well.  She is on Eliquis without any major bleeding issues.  She reports she walks daily.  Up to 2 to 3 miles without any limitations.  She denies any chest pain or shortness of breath.  She has no lower extremity edema.  She reports she was diagnosed with Ehlers-Danlos syndrome.  She is had no aortic involvement.  She also was told she had mitral valve prolapse but apparently on further imaging she did not have this.  Overall she appears to be doing well without any chest pain, shortness of breath or palpitations.  She does monitor her atrial fibrillation with her apple watch.  She is planning to go on a 45-month cruise from what it appears to be around the world.  Her husband will be giving several speeches on the trip.  Problem List 1. Paroxysmal Afib -ablation 01/09/2016 -CHADSVASC=4 (age, female, stroke) -on flecanide 2. OSA on CPAP 3. HLD 4. Ehlers-Danlos  Past Medical History: Past Medical History:  Diagnosis Date  . ADD (attention deficit disorder)   . Arrhythmia   . Cancer [facility]) 2001   left breast, treated with lumpectomy, radiation, and chemotherapy  . Depression   . Mitral valve prolapse   . Myocarditis ([place])    at age 66  . Paroxysmal atrial fibrillation (HCC)   . Sleep apnea    uses CPAP followed by Dr Maxwell Caul  . Stroke [facility]) 2015     Past Surgical History: Past Surgical History:  Procedure Laterality Date  . BREAST LUMPECTOMY  2001   malignancy removed  . BREAST LUMPECTOMY WITH RADIOACTIVE SEED LOCALIZATION Right 08/09/2013   benign  . BREAST SURGERY  1993   left cyst removed  . CATARACT EXTRACTION    . ELECTROPHYSIOLOGIC STUDY N/A 01/09/2016   Procedure: Atrial Fibrillation Ablation;  Surgeon: Thompson Grayer, MD;  Location: [place] [facility];  Service: Cardiovascular;  Laterality: N/A;  . OOPHORECTOMY Left   . right knee tibia surgery     2006    Current Medications: Current Meds  Medication Sig  . apixaban (ELIQUIS) 5 MG TABS tablet TAKE 1 TABLET(5 MG) BY MOUTH TWICE DAILY  . cetirizine (ZYRTEC) 10 MG tablet Take 10 mg by mouth every evening.  . flecainide (TAMBOCOR) 100 MG tablet Take 1 tablet (100 mg total) by mouth 2 (two) times daily.  . fluticasone (CUTIVATE) 0.05 % cream APPLY TO AFFECTED AREA UPTO TWICE DAILY AS NEEDED  . ketoconazole (NIZORAL) 2 % cream APPLY TO AFFECTED AREA UPTO TWICE DAILY AS NEEDED  . lamoTRIgine (LAMICTAL) 200 MG tablet Take 200 mg by mouth at bedtime.   . metoprolol succinate (TOPROL-XL) 25 MG 24 hr tablet TAKE 1 TABLET(25 MG) BY MOUTH DAILY  . modafinil (PROVIGIL) 100  MG tablet Take 150 mg by mouth daily before breakfast.  . Omega-3 Fatty Acids (FISH OIL) 1000 MG CAPS Take 1,000 mg by mouth at bedtime.   . pravastatin (PRAVACHOL) 40 MG tablet TAKE 1 TABLET(40 MG) BY MOUTH DAILY  . vortioxetine HBr (TRINTELLIX) 20 MG TABS tablet Take 20 mg by mouth daily.   . [DISCONTINUED] ELIQUIS 5 MG TABS tablet TAKE 1 TABLET(5 MG) BY MOUTH TWICE DAILY  . [DISCONTINUED] flecainide (TAMBOCOR) 100 MG tablet TAKE 1 TABLET BY MOUTH TWICE DAILY  . [DISCONTINUED] metoprolol succinate (TOPROL-XL) 25 MG 24 hr tablet TAKE 1 TABLET(25 MG) BY MOUTH DAILY  . [DISCONTINUED] pravastatin (PRAVACHOL) 40 MG tablet TAKE 1 TABLET(40 MG) BY MOUTH DAILY     Allergies:    Penicillin g, Sulfa antibiotics,  Penicillins, and Nickel   Social History: Social History   Socioeconomic History  . Marital status: Married    Spouse name: Not on file  . Number of children: 2  . Years of education: Bachelor's  . Highest education level: Not on file  Occupational History  . Occupation: Retired  Tobacco Use  . Smoking status: Never Smoker  . Smokeless tobacco: Never Used  Vaping Use  . Vaping Use: Never used  Substance and Sexual Activity  . Alcohol use: Yes    Alcohol/week: 0.0 standard drinks    Comment: 1-2 times a week (2 beers), sometimes more  . Drug use: No  . Sexual activity: Yes  Other Topics Concern  . Not on file  Social History Narrative   Patient is married.   Patient has a college education.   Patient is left handed.   Patient drinks 1.5 cups of caffeine daily.   Lives in [place].  Retired   Scientist, physiological Strain:   . Difficulty of Paying Living Expenses: Not on file  Food Insecurity:   . Worried About Charity fundraiser in the Last Year: Not on file  . Ran Out of Food in the Last Year: Not on file  Transportation Needs:   . Lack of Transportation (Medical): Not on file  . Lack of Transportation (Non-Medical): Not on file  Physical Activity:   . Days of Exercise per Week: Not on file  . Minutes of Exercise per Session: Not on file  Stress:   . Feeling of Stress : Not on file  Social Connections:   . Frequency of Communication with Friends and Family: Not on file  . Frequency of Social Gatherings with Friends and Family: Not on file  . Attends Religious Services: Not on file  . Active Member of Clubs or Organizations: Not on file  . Attends Archivist Meetings: Not on file  . Marital Status: Not on file     Family History: The patient's family history includes Breast cancer in her paternal grandmother; Colon cancer in her father; Heart disease in her mother.  ROS:   All other ROS reviewed and negative.  Pertinent positives noted in the HPI.     EKGs/Labs/Other Studies Reviewed:   The following studies were personally reviewed by me today:  TTE 12/25/2013 - Left ventricle: The cavity size was normal. Systolic function was  normal. The estimated ejection fraction was in the range of 60%  to 65%. Wall motion was normal; there were no regional wall  motion abnormalities. Left ventricular diastolic function  parameters were normal.  - Aortic valve: There was no regurgitation.  - Mitral valve: There was  mild regurgitation.  - Left atrium: The atrium was normal in size.  - Right ventricle: Systolic function was normal.  - Right atrium: The atrium was normal in size.  - Tricuspid valve: There was mild regurgitation.  - Pulmonic valve: There was no regurgitation.  - Pulmonary arteries: Systolic pressure was within the normal  range.    Recent Labs: No results found for requested labs within last 8760 hours.   Recent Lipid Panel    Component Value Date/Time   CHOL 186 12/25/2013 0251   TRIG 77 12/25/2013 0251   HDL 51 12/25/2013 0251   CHOLHDL 3.6 12/25/2013 0251   VLDL 15 12/25/2013 0251   LDLCALC 120 (H) 12/25/2013 0251    Physical Exam:   VS:  BP (!) 164/72   Pulse 69   Ht 5\' 6"  (1.676 m)   Wt 164 lb 3.2 oz (74.5 kg)   SpO2 96%   BMI 26.50 kg/m    Wt Readings from Last 3 Encounters:  01/25/20 164 lb 3.2 oz (74.5 kg)  01/13/20 162 lb 12.8 oz (73.8 kg)  07/08/19 165 lb 6.4 oz (75 kg)    General: Well nourished, well developed, in no acute distress Heart: Atraumatic, normal size  Eyes: PEERLA, EOMI  Neck: Supple, no JVD Endocrine: No thryomegaly Cardiac: Normal S1, S2; RRR; no murmurs, rubs, or gallops Lungs: Clear to auscultation bilaterally, no wheezing, rhonchi or rales  Abd: Soft, nontender, no hepatomegaly  Ext: No edema, pulses 2+ Musculoskeletal: No deformities, BUE and BLE strength normal and equal Skin: Warm and dry, no rashes   Neuro: Alert and  oriented to person, place, time, and situation, CNII-XII grossly intact, no focal deficits  Psych: Normal mood and affect   ASSESSMENT:   Ashley White is a 69 y.o. female who presents for the following: 1. Paroxysmal atrial fibrillation (HCC)   2. Mixed hyperlipidemia     PLAN:   1. Paroxysmal atrial fibrillation (HCC) -A. fib ablation in 2017.  On flecainide 100 milligrams twice daily.  She is also on metoprolol succinate 25 mg daily. -CHADSVASC=4.  Continue Eliquis 5 mg twice daily.  No bleeding issues. -She is maintaining sinus rhythm.  She will continue with periodic surveillance.  She has an apple watch capable of EKGs. -She will see [place] back in 6 months. -Medications refilled today.  2. Mixed hyperlipidemia -She does need a fasting lipid profile.  We will check one today.  Disposition: Return in about 6 months (around 07/25/2020).  Medication Adjustments/Labs and Tests Ordered: Current medicines are reviewed at length with the patient today.  Concerns regarding medicines are outlined above.  Orders Placed This Encounter  Procedures  . Lipid panel   Meds ordered this encounter  Medications  . apixaban (ELIQUIS) 5 MG TABS tablet    Sig: TAKE 1 TABLET(5 MG) BY MOUTH TWICE DAILY    Dispense:  180 tablet    Refill:  3  . flecainide (TAMBOCOR) 100 MG tablet    Sig: Take 1 tablet (100 mg total) by mouth 2 (two) times daily.    Dispense:  180 tablet    Refill:  3  . metoprolol succinate (TOPROL-XL) 25 MG 24 hr tablet    Sig: TAKE 1 TABLET(25 MG) BY MOUTH DAILY    Dispense:  90 tablet    Refill:  3  . pravastatin (PRAVACHOL) 40 MG tablet    Sig: TAKE 1 TABLET(40 MG) BY MOUTH DAILY    Dispense:  90 tablet  Refill:  3    Patient Instructions  Medication Instructions:  Continue same medications *If you need a refill on your cardiac medications before your next appointment, please call your pharmacy*   Lab Work: Lipid Panel If you have labs (blood work) drawn today and  your tests are completely normal, you will receive your results only by: [name] MyChart Message (if you have MyChart) OR . A paper copy in the mail If you have any lab test that is abnormal or we need to change your treatment, we will call you to review the results.   Testing/Procedures: None ordered   Follow-Up: At [facility], you and your health needs are our priority.  As part of our continuing mission to provide you with exceptional heart care, we have created designated Provider Care Teams.  These Care Teams include your primary Cardiologist (physician) and Advanced Practice Providers (APPs -  Physician Assistants and Nurse Practitioners) who all work together to provide you with the care you need, when you need it.  We recommend signing up for the patient portal called "MyChart".  Sign up information is provided on this After Visit Summary.  MyChart is used to connect with patients for Virtual Visits (Telemedicine).  Patients are able to view lab/test results, encounter notes, upcoming appointments, etc.  Non-urgent messages can be sent to your provider as well.   To learn more about what you can do with MyChart, go to [email or web address].    Your next appointment:  6 months   Call in Feb to schedule April appointment    The format for your next appointment: Office     Provider: Dr.O'Neal      Time Spent with Patient: I have spent a total of 35 minutes with patient reviewing hospital notes, telemetry, EKGs, labs and examining the patient as well as establishing an assessment and plan that was discussed with the patient.  > 50% of time was spent in direct patient care.  Signed, Addison Naegeli. Audie Box, [place]  [address] [place], [place] [address] [phone]  01/25/2020 9:32 AM

## 2020-01-24 DIAGNOSIS — F908 Attention-deficit hyperactivity disorder, other type: Secondary | ICD-10-CM | POA: Diagnosis not present

## 2020-01-24 DIAGNOSIS — F3181 Bipolar II disorder: Secondary | ICD-10-CM | POA: Diagnosis not present

## 2020-01-25 ENCOUNTER — Other Ambulatory Visit: Payer: Self-pay

## 2020-01-25 ENCOUNTER — Ambulatory Visit: Payer: Medicare Other | Admitting: Cardiovascular Disease

## 2020-01-25 ENCOUNTER — Encounter: Payer: Self-pay | Admitting: Cardiovascular Disease

## 2020-01-25 VITALS — BP 164/72 | HR 69 | Ht 66.0 in | Wt 164.2 lb

## 2020-01-25 DIAGNOSIS — I480 Paroxysmal atrial fibrillation: Secondary | ICD-10-CM

## 2020-01-25 DIAGNOSIS — E782 Mixed hyperlipidemia: Secondary | ICD-10-CM | POA: Diagnosis not present

## 2020-01-25 LAB — LIPID PANEL
Chol/HDL Ratio: 2.7 ratio (ref 0.0–4.4)
Cholesterol, Total: 240 mg/dL — ABNORMAL HIGH (ref 100–199)
HDL: 89 mg/dL (ref >39)
LDL Chol Calc (NIH): 140 mg/dL — ABNORMAL HIGH (ref 0–99)
Triglycerides: 65 mg/dL (ref 0–149)
VLDL Cholesterol Cal: 11 mg/dL (ref 5–40)

## 2020-01-25 MED ORDER — PRAVASTATIN SODIUM 40 MG PO TABS: ORAL_TABLET | ORAL | 3 refills | Status: DC

## 2020-01-25 MED ORDER — FLECAINIDE ACETATE 100 MG PO TABS
100.0000 mg | ORAL_TABLET | Freq: Two times a day (BID) | ORAL | 3 refills | Status: DC
Start: 2020-01-25 — End: 2021-03-21

## 2020-01-25 MED ORDER — METOPROLOL SUCCINATE ER 25 MG PO TB24
ORAL_TABLET | ORAL | 3 refills | Status: DC
Start: 2020-01-25 — End: 2020-05-01

## 2020-01-25 MED ORDER — APIXABAN 5 MG PO TABS
ORAL_TABLET | ORAL | 3 refills | Status: DC
Start: 2020-01-25 — End: 2020-10-04

## 2020-01-25 NOTE — Patient Instructions (Signed)
Medication Instructions:  Continue same medications *If you need a refill on your cardiac medications before your next appointment, please call your pharmacy*   Lab Work: Lipid Panel If you have labs (blood work) drawn today and your tests are completely normal, you will receive your results only by:  [place] (if you have MyChart) OR  A paper copy in the mail If you have any lab test that is abnormal or we need to change your treatment, we will call you to review the results.   Testing/Procedures: None ordered   Follow-Up: At [facility], you and your health needs are our priority.  As part of our continuing mission to provide you with exceptional heart care, we have created designated Provider Care Teams.  These Care Teams include your primary Cardiologist (physician) and Advanced Practice Providers (APPs -  Physician Assistants and Nurse Practitioners) who all work together to provide you with the care you need, when you need it.  We recommend signing up for the patient portal called "MyChart".  Sign up information is provided on this After Visit Summary.  MyChart is used to connect with patients for Virtual Visits (Telemedicine).  Patients are able to view lab/test results, encounter notes, upcoming appointments, etc.  Non-urgent messages can be sent to your provider as well.   To learn more about what you can do with MyChart, go to [email or web address].    Your next appointment:  6 months   Call in Feb to schedule April appointment    The format for your next appointment: Office     Provider: Dr.O'Neal

## 2020-01-26 ENCOUNTER — Other Ambulatory Visit: Payer: Self-pay | Admitting: *Deleted

## 2020-01-26 MED ORDER — ROSUVASTATIN CALCIUM 20 MG PO TABS: 20.0000 mg | ORAL_TABLET | Freq: Every day | ORAL | 3 refills | Status: DC

## 2020-01-29 ENCOUNTER — Telehealth: Payer: Self-pay | Admitting: Cardiology

## 2020-01-29 NOTE — Telephone Encounter (Signed)
Pt's HR has been 120 for almost 2 days.  No chest pain or SOB.  She is feeling a little panicky with the HR.  She will take an extra 25 of toprol now.  He rBP is 167/109 - in AM she will take her usual torpol and check BP and pulse and call us if still elevated.  She does not believe it is a fib, but may be flutter.  Will ask a fib clinic to see Monday if still out of rhythm   She and her husband are agreeable.

## 2020-01-30 ENCOUNTER — Telehealth: Payer: Self-pay | Admitting: Cardiology

## 2020-01-30 NOTE — Telephone Encounter (Signed)
Agree with this plan.   [place] T. Audie Box, [place]  [address] [place], [place] [address] [phone]  8:40 AM

## 2020-01-30 NOTE — Telephone Encounter (Signed)
Today, BP is better, now 122/87 but HR still 122 to 123, I believe it may be a flutter.  She goes out of town on [place]. For 2 months.   Will ask a fib clinic to see.

## 2020-01-31 ENCOUNTER — Other Ambulatory Visit (HOSPITAL_COMMUNITY): Payer: Medicare Other

## 2020-01-31 ENCOUNTER — Encounter (HOSPITAL_COMMUNITY): Payer: Self-pay | Admitting: Nurse Practitioner

## 2020-01-31 ENCOUNTER — Ambulatory Visit (HOSPITAL_COMMUNITY)
Admission: RE | Admit: 2020-01-31 | Discharge: 2020-01-31 | Disposition: A | Discharge status: Home / Self Care | Payer: Medicare Other | Source: Ambulatory Visit | Attending: Nurse Practitioner | Admitting: Nurse Practitioner

## 2020-01-31 ENCOUNTER — Other Ambulatory Visit: Payer: Self-pay

## 2020-01-31 ENCOUNTER — Other Ambulatory Visit (HOSPITAL_COMMUNITY): Payer: Self-pay | Admitting: Nurse Practitioner

## 2020-01-31 ENCOUNTER — Other Ambulatory Visit (HOSPITAL_COMMUNITY)
Admission: RE | Admit: 2020-01-31 | Discharge: 2020-01-31 | Disposition: A | Discharge status: Home / Self Care | Payer: Medicare Other | Source: Ambulatory Visit | Attending: Internal Medicine | Admitting: Internal Medicine

## 2020-01-31 VITALS — BP 132/90 | HR 134 | Ht 66.0 in | Wt 164.8 lb

## 2020-01-31 DIAGNOSIS — F908 Attention-deficit hyperactivity disorder, other type: Secondary | ICD-10-CM | POA: Diagnosis not present

## 2020-01-31 DIAGNOSIS — D6869 Other thrombophilia: Secondary | ICD-10-CM

## 2020-01-31 DIAGNOSIS — I480 Paroxysmal atrial fibrillation: Secondary | ICD-10-CM | POA: Diagnosis not present

## 2020-01-31 DIAGNOSIS — Z880 Allergy status to penicillin: Secondary | ICD-10-CM | POA: Diagnosis not present

## 2020-01-31 DIAGNOSIS — Z7901 Long term (current) use of anticoagulants: Secondary | ICD-10-CM | POA: Diagnosis not present

## 2020-01-31 DIAGNOSIS — Z882 Allergy status to sulfonamides status: Secondary | ICD-10-CM | POA: Insufficient documentation

## 2020-01-31 DIAGNOSIS — Z79899 Other long term (current) drug therapy: Secondary | ICD-10-CM | POA: Insufficient documentation

## 2020-01-31 DIAGNOSIS — Z8249 Family history of ischemic heart disease and other diseases of the circulatory system: Secondary | ICD-10-CM | POA: Diagnosis not present

## 2020-01-31 DIAGNOSIS — I4892 Unspecified atrial flutter: Secondary | ICD-10-CM

## 2020-01-31 DIAGNOSIS — Z20822 Contact with and (suspected) exposure to covid-19: Secondary | ICD-10-CM | POA: Insufficient documentation

## 2020-01-31 DIAGNOSIS — F3181 Bipolar II disorder: Secondary | ICD-10-CM | POA: Diagnosis not present

## 2020-01-31 LAB — SARS CORONAVIRUS 2 (TAT 6-24 HRS): SARS Coronavirus 2: NEGATIVE

## 2020-01-31 LAB — CBC
HCT: 48.2 % — ABNORMAL HIGH (ref 36.0–46.0)
Hemoglobin: 15.2 g/dL — ABNORMAL HIGH (ref 12.0–15.0)
MCH: 30.3 pg (ref 26.0–34.0)
MCHC: 31.5 g/dL (ref 30.0–36.0)
MCV: 96 fL (ref 80.0–100.0)
Platelets: 274 10*3/uL (ref 150–400)
RBC: 5.02 MIL/uL (ref 3.87–5.11)
RDW: 12.7 % (ref 11.5–15.5)
WBC: 8.3 10*3/uL (ref 4.0–10.5)
nRBC: 0 % (ref 0.0–0.2)

## 2020-01-31 LAB — BASIC METABOLIC PANEL
Anion gap: 9 (ref 5–15)
BUN: 14 mg/dL (ref 8–23)
CO2: 30 mmol/L (ref 22–32)
Calcium: 9.5 mg/dL (ref 8.9–10.3)
Chloride: 102 mmol/L (ref 98–111)
Creatinine, Ser: 0.73 mg/dL (ref 0.44–1.00)
GFR, Estimated: >60 mL/min (ref >60)
Glucose, Bld: 104 mg/dL — ABNORMAL HIGH (ref 70–99)
Potassium: 4.8 mmol/L (ref 3.5–5.1)
Sodium: 141 mmol/L (ref 135–145)

## 2020-01-31 LAB — TSH: TSH: 1.387 u[IU]/mL (ref 0.350–4.500)

## 2020-01-31 MED ORDER — DILTIAZEM HCL 30 MG PO TABS: ORAL_TABLET | ORAL | 0 refills | Status: DC

## 2020-01-31 NOTE — H&P (View-Only) (Signed)
Primary Care Physician: [name], MD Referring Physician: ER f/u EP: Dr. Marvia Pickles Ashley White is a 69 y.o. female with a h/o paroxysmal afib s/p ablation 12/2015. She presents  for f/u. She remains on flecainide. Dr. Rayann Heman said that she could wean off but she would prefer to stay on it as she has not had any afib. No bleeding issues with eliquis.  F/u in afib clinic,01/13/20. She feels well and reports no afib. She continues on flecainide. No bleeding issues with eliquis. Ekg shows SR. BP elevated on presentation, rechecked at 138/78. She is requesting thru the prompting of her PCP to have a general cardiologist follow her.   I am seeing pt today as she went into a flutter with RVR over the weekend . Ashley White She  tried 1-2 extra tabs of metoprolol succinate without returning to SR. She is on flecainide 100 mg bid . She did miss one dose of eliquis weekend prior. She  has been worried about her sister undergoing chemotherapy. She has also been trying to get affairs in order for a 2 month cruise she and her husband are going on. THis  leaves a week Wednesday.  She is stable in atrial flutter.  Today, she denies symptoms of palpitations, chest pain, shortness of breath, orthopnea, PND, lower extremity edema, dizziness, presyncope, syncope, or neurologic sequela. The patient is tolerating medications without difficulties and is otherwise without complaint today.   Past Medical History:  Diagnosis Date  . ADD (attention deficit disorder)   . Arrhythmia   . Cancer [facility]) 2001   left breast, treated with lumpectomy, radiation, and chemotherapy  . Depression   . Mitral valve prolapse   . 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 Paroxysmal atrial fibrillation (HCC)   . Sleep apnea    uses CPAP followed by Dr Maxwell Caul  . Stroke [facility]) 2015   Past Surgical History:  Procedure Laterality Date  . BREAST LUMPECTOMY  2001   malignancy removed  . BREAST LUMPECTOMY WITH RADIOACTIVE SEED LOCALIZATION Right  08/09/2013   benign  . BREAST SURGERY  1993   left cyst removed  . CATARACT EXTRACTION    . ELECTROPHYSIOLOGIC STUDY N/A 01/09/2016   Procedure: Atrial Fibrillation Ablation;  Surgeon: Thompson Grayer, MD;  Location: [place] [facility];  Service: Cardiovascular;  Laterality: N/A;  . OOPHORECTOMY Left   . right knee tibia surgery     2006    Current Outpatient Medications  Medication Sig Dispense Refill  . apixaban (ELIQUIS) 5 MG TABS tablet TAKE 1 TABLET(5 MG) BY MOUTH TWICE DAILY 180 tablet 3  . cetirizine (ZYRTEC) 10 MG tablet Take 10 mg by mouth every evening.    . flecainide (TAMBOCOR) 100 MG tablet Take 1 tablet (100 mg total) by mouth 2 (two) times daily. 180 tablet 3  . fluticasone (CUTIVATE) 0.05 % cream APPLY TO AFFECTED AREA UPTO TWICE DAILY AS NEEDED    . ketoconazole (NIZORAL) 2 % cream APPLY TO AFFECTED AREA UPTO TWICE DAILY AS NEEDED    . lamoTRIgine (LAMICTAL) 200 MG tablet Take 200 mg by mouth at bedtime.   5  . metoprolol succinate (TOPROL-XL) 25 MG 24 hr tablet TAKE 1 TABLET(25 MG) BY MOUTH DAILY 90 tablet 3  . modafinil (PROVIGIL) 100 MG tablet Take 150 mg by mouth daily before breakfast.    . Omega-3 Fatty Acids (FISH OIL) 1000 MG CAPS Take 1,000 mg by mouth at bedtime.     Ashley White  rosuvastatin (CRESTOR) 20 MG tablet Take 1 tablet (20 mg total) by mouth daily. 90 tablet 3  . vortioxetine HBr (TRINTELLIX) 20 MG TABS tablet Take 20 mg by mouth daily.     Ashley White diltiazem (CARDIZEM) 30 MG tablet Take one tablet every 4 hours as needed for Heart rate greater than 100, top number blood pressure needs to be above 100 45 tablet 0   No current facility-administered medications for this encounter.    Allergies  Allergen Reactions  . Penicillin G Other (See Comments)  . Sulfa Antibiotics Itching  . Penicillins Other (See Comments)    From childhood/unknown reaction: DID THE REACTION INVOLVE: Swelling of the face/tongue/throat, SOB, or low BP? Unk Sudden or severe rash/hives, skin  peeling, or the inside of the mouth or nose? Unk Did it require medical treatment? Unk When did it last happen?Childhood If all above answers are "NO", may proceed with cephalosporin use.   . Nickel Itching and Rash    Social History   Socioeconomic History  . Marital status: Married    Spouse name: Not on file  . Number of children: 2  . Years of education: Bachelor's  . Highest education level: Not on file  Occupational History  . Occupation: Retired  Tobacco Use  . Smoking status: Never Smoker  . Smokeless tobacco: Never Used  Vaping Use  . Vaping Use: Never used  Substance and Sexual Activity  . Alcohol use: Yes    Alcohol/week: 0.0 standard drinks    Comment: 1-2 times a week (2 beers), sometimes more  . Drug use: No  . Sexual activity: Yes  Other Topics Concern  . Not on file  Social History Narrative   Patient is married.   Patient has a college education.   Patient is left handed.   Patient drinks 1.5 cups of caffeine daily.   Lives in [place].  Retired   Scientist, physiological Strain:   . Difficulty of Paying Living Expenses: Not on file  Food Insecurity:   . Worried About Charity fundraiser in the Last Year: Not on file  . Ran Out of Food in the Last Year: Not on file  Transportation Needs:   . Lack of Transportation (Medical): Not on file  . Lack of Transportation (Non-Medical): Not on file  Physical Activity:   . Days of Exercise per Week: Not on file  . Minutes of Exercise per Session: Not on file  Stress:   . Feeling of Stress : Not on file  Social Connections:   . Frequency of Communication with Friends and Family: Not on file  . Frequency of Social Gatherings with Friends and Family: Not on file  . Attends Religious Services: Not on file  . Active Member of Clubs or Organizations: Not on file  . Attends Archivist Meetings: Not on file  . Marital Status: Not on file  Intimate Partner Violence:     . Fear of Current or Ex-Partner: Not on file  . Emotionally Abused: Not on file  . Physically Abused: Not on file  . Sexually Abused: Not on file    Family History  Problem Relation Age of Onset  . Heart disease Mother   . Colon cancer Father   . Breast cancer Paternal Grandmother     ROS- All systems are reviewed and negative except as per the HPI above  Physical Exam: Vitals:   01/31/20 0949  BP: 132/90  Pulse: Ashley White)  134  Weight: 74.8 kg  Height: 5\' 6"  (1.676 m)   Wt Readings from Last 3 Encounters:  01/31/20 74.8 kg  01/25/20 74.5 kg  01/13/20 73.8 kg    Labs: Lab Results  Component Value Date   NA 139 01/05/2019   K 3.9 01/05/2019   CL 102 01/05/2019   CO2 25 01/05/2019   GLUCOSE 86 01/05/2019   BUN 17 01/05/2019   CREATININE 0.68 01/05/2019   CALCIUM 9.3 01/05/2019   Lab Results  Component Value Date   INR 1.11 12/24/2013   Lab Results  Component Value Date   CHOL 240 (H) 01/25/2020   HDL 89 01/25/2020   LDLCALC 140 (H) 01/25/2020   TRIG 65 01/25/2020     GEN- The patient is well appearing, alert and oriented x 3 today.   Head- normocephalic, atraumatic Eyes-  Sclera clear, conjunctiva pink Ears- hearing intact Oropharynx- clear Neck- supple, no JVP Lymph- no cervical lymphadenopathy Lungs- Clear to ausculation bilaterally, normal work of breathing Heart- Rapid regular rate and rhythm, no murmurs, rubs or gallops, PMI not laterally displaced GI- soft, NT, ND, + BS Extremities- no clubbing, cyanosis, or edema MS- no significant deformity or atrophy Skin- no rash or lesion Psych- euthymic mood, full affect Neuro- strength and sensation are intact  EKG- atrial flutter at 134 bpm, NOT Acute MI as erroneously read out on EKG, Flutter waves are causing this type of erroneous read out, pt is without chest pain, stable  Assessment and Plan: 1. Paroxysmal afib S/p ablation 2017 Broke thru  in atrial flutter with a v rate of 120-130's  Continue   flecainide 100 mg bid  Continue  Metoprolol 25 mg daily  Will order a TEE guided cardioversion, scheduled this Thursday,  as she missed a dose of eliquis in the last 10 days  Will prescribe 30 mg Cardizem as needed every 4-6 hours for RVR Continue eliquis 5 mg bid  for a CHA2DS2VASc score of 4  Cbc/tsh/bmet/covid  As had both vaccines  Will see back the next week prior to cruise after cardioversion   [place]. Terria Deschepper, [facility] [address] [place], [place] [address] [phone]

## 2020-01-31 NOTE — Progress Notes (Signed)
Primary Care Physician: Ashley Prose, MD Referring Physician: ER f/u EP: Dr. Marvia Pickles White is a 68 y.o. White with a h/o paroxysmal afib s/p ablation 12/2015. She presents  for f/u. She remains on flecainide. Dr. Rayann White said that she could wean off but she would prefer to stay on it as she has not had any afib. No bleeding issues with eliquis.  F/u in afib clinic,01/13/20. She feels well and reports no afib. She continues on flecainide. No bleeding issues with eliquis. Ekg shows SR. BP elevated on presentation, rechecked at 138/78. She is requesting thru the prompting of her PCP to have a general cardiologist follow her.   I am seeing pt today as she went into a flutter with RVR over the weekend . Ashley White She  tried 1-2 extra tabs of metoprolol succinate without returning to SR. She is on flecainide 100 mg bid . She did miss one dose of eliquis weekend prior. She  has been worried about her sister undergoing chemotherapy. She has also been trying to get affairs in order for a 2 month cruise she and her husband are going on. THis  leaves a week Wednesday.  She is stable in atrial flutter.  Today, she denies symptoms of palpitations, chest pain, shortness of breath, orthopnea, PND, lower extremity edema, dizziness, presyncope, syncope, or neurologic sequela. The patient is tolerating medications without difficulties and is otherwise without complaint today.   Past Medical History:  Diagnosis Date  . ADD (attention deficit disorder)   . Arrhythmia   . Cancer [facility]) 2001   left breast, treated with lumpectomy, radiation, and chemotherapy  . Depression   . Mitral valve prolapse   . Myocarditis ([place])    at age 21  . Paroxysmal atrial fibrillation (HCC)   . Sleep apnea    uses CPAP followed by Dr Ashley White  . Stroke [facility]) 2015   Past Surgical History:  Procedure Laterality Date  . BREAST LUMPECTOMY  2001   malignancy removed  . BREAST LUMPECTOMY WITH RADIOACTIVE SEED LOCALIZATION Right  08/09/2013   benign  . BREAST SURGERY  1993   left cyst removed  . CATARACT EXTRACTION    . ELECTROPHYSIOLOGIC STUDY N/A 01/09/2016   Procedure: Atrial Fibrillation Ablation;  Surgeon: Ashley Grayer, MD;  Location: [place] [facility];  Service: Cardiovascular;  Laterality: N/A;  . OOPHORECTOMY Left   . right knee tibia surgery     2006    Current Outpatient Medications  Medication Sig Dispense Refill  . apixaban (ELIQUIS) 5 MG TABS tablet TAKE 1 TABLET(5 MG) BY MOUTH TWICE DAILY 180 tablet 3  . cetirizine (ZYRTEC) 10 MG tablet Take 10 mg by mouth every evening.    . flecainide (TAMBOCOR) 100 MG tablet Take 1 tablet (100 mg total) by mouth 2 (two) times daily. 180 tablet 3  . fluticasone (CUTIVATE) 0.05 % cream APPLY TO AFFECTED AREA UPTO TWICE DAILY AS NEEDED    . ketoconazole (NIZORAL) 2 % cream APPLY TO AFFECTED AREA UPTO TWICE DAILY AS NEEDED    . lamoTRIgine (LAMICTAL) 200 MG tablet Take 200 mg by mouth at bedtime.   5  . metoprolol succinate (TOPROL-XL) 25 MG 24 hr tablet TAKE 1 TABLET(25 MG) BY MOUTH DAILY 90 tablet 3  . modafinil (PROVIGIL) 100 MG tablet Take 150 mg by mouth daily before breakfast.    . Omega-3 Fatty Acids (FISH OIL) 1000 MG CAPS Take 1,000 mg by mouth at bedtime.     Ashley White  rosuvastatin (CRESTOR) 20 MG tablet Take 1 tablet (20 mg total) by mouth daily. 90 tablet 3  . vortioxetine HBr (TRINTELLIX) 20 MG TABS tablet Take 20 mg by mouth daily.     Ashley White diltiazem (CARDIZEM) 30 MG tablet Take one tablet every 4 hours as needed for Heart rate greater than 100, top number blood pressure needs to be above 100 45 tablet 0   No current facility-administered medications for this encounter.    Allergies  Allergen Reactions  . Penicillin G Other (See Comments)  . Sulfa Antibiotics Itching  . Penicillins Other (See Comments)    From childhood/unknown reaction: DID THE REACTION INVOLVE: Swelling of the face/tongue/throat, SOB, or low BP? Unk Sudden or severe rash/hives, skin  peeling, or the inside of the mouth or nose? Unk Did it require medical treatment? Unk When did it last happen?Childhood If all above answers are "NO", may proceed with cephalosporin use.   . Nickel Itching and Rash    Social History   Socioeconomic History  . Marital status: Married    Spouse name: Not on file  . Number of children: 2  . Years of education: Bachelor's  . Highest education level: Not on file  Occupational History  . Occupation: Retired  Tobacco Use  . Smoking status: Never Smoker  . Smokeless tobacco: Never Used  Vaping Use  . Vaping Use: Never used  Substance and Sexual Activity  . Alcohol use: Yes    Alcohol/week: 0.0 standard drinks    Comment: 1-2 times a week (2 beers), sometimes more  . Drug use: No  . Sexual activity: Yes  Other Topics Concern  . Not on file  Social History Narrative   Patient is married.   Patient has a college education.   Patient is left handed.   Patient drinks 1.5 cups of caffeine daily.   Lives in [place].  Retired   Scientist, physiological Strain:   . Difficulty of Paying Living Expenses: Not on file  Food Insecurity:   . Worried About Charity fundraiser in the Last Year: Not on file  . Ran Out of Food in the Last Year: Not on file  Transportation Needs:   . Lack of Transportation (Medical): Not on file  . Lack of Transportation (Non-Medical): Not on file  Physical Activity:   . Days of Exercise per Week: Not on file  . Minutes of Exercise per Session: Not on file  Stress:   . Feeling of Stress : Not on file  Social Connections:   . Frequency of Communication with Friends and Family: Not on file  . Frequency of Social Gatherings with Friends and Family: Not on file  . Attends Religious Services: Not on file  . Active Member of Clubs or Organizations: Not on file  . Attends Archivist Meetings: Not on file  . Marital Status: Not on file  Intimate Partner Violence:     . Fear of Current or Ex-Partner: Not on file  . Emotionally Abused: Not on file  . Physically Abused: Not on file  . Sexually Abused: Not on file    Family History  Problem Relation Age of Onset  . Heart disease Mother   . Colon cancer Father   . Breast cancer Paternal Grandmother     ROS- All systems are reviewed and negative except as per the HPI above  Physical Exam: Vitals:   01/31/20 0949  BP: 132/90  Pulse: Ashley White)  134  Weight: 74.8 kg  Height: 5\' 6"  (1.676 m)   Wt Readings from Last 3 Encounters:  01/31/20 74.8 kg  01/25/20 74.5 kg  01/13/20 73.8 kg    Labs: Lab Results  Component Value Date   NA 139 01/05/2019   K 3.9 01/05/2019   CL 102 01/05/2019   CO2 25 01/05/2019   GLUCOSE 86 01/05/2019   BUN 17 01/05/2019   CREATININE 0.68 01/05/2019   CALCIUM 9.3 01/05/2019   Lab Results  Component Value Date   INR 1.11 12/24/2013   Lab Results  Component Value Date   CHOL 240 (H) 01/25/2020   HDL 89 01/25/2020   LDLCALC 140 (H) 01/25/2020   TRIG 65 01/25/2020     GEN- The patient is well appearing, alert and oriented x 3 today.   Head- normocephalic, atraumatic Eyes-  Sclera clear, conjunctiva pink Ears- hearing intact Oropharynx- clear Neck- supple, no JVP Lymph- no cervical lymphadenopathy Lungs- Clear to ausculation bilaterally, normal work of breathing Heart- Rapid regular rate and rhythm, no murmurs, rubs or gallops, PMI not laterally displaced GI- soft, NT, ND, + BS Extremities- no clubbing, cyanosis, or edema MS- no significant deformity or atrophy Skin- no rash or lesion Psych- euthymic mood, full affect Neuro- strength and sensation are intact  EKG- atrial flutter at 134 bpm, NOT Acute MI as erroneously read out on EKG, Flutter waves are causing this type of erroneous read out, pt is without chest pain, stable  Assessment and Plan: 1. Paroxysmal afib S/p ablation 2017 Broke thru  in atrial flutter with a v rate of 120-130's  Continue   flecainide 100 mg bid  Continue  Metoprolol 25 mg daily  Will order a TEE guided cardioversion, scheduled this Thursday,  as she missed a dose of eliquis in the last 10 days  Will prescribe 30 mg Cardizem as needed every 4-6 hours for RVR Continue eliquis 5 mg bid  for a CHA2DS2VASc score of 4  Cbc/tsh/bmet/covid  As had both vaccines  Will see back the next week prior to cruise after cardioversion   [place]. Gitel Beste, [facility] [address] [place], [place] [address] [phone]

## 2020-01-31 NOTE — Patient Instructions (Signed)
Cardizem 30mg - Take one tablet by mouth every 4 hours as needed for Heart rate greater than 100 and her top number blood pressure needs to be above 100   Cardioversion scheduled for November 4th @ 9:00am  - Arrive at the [facility] and go to admitting at 8:00am  - Do not eat or drink anything after midnight the night prior to your procedure.  - Take all your morning medication (except diabetic medications) with a sip of water prior to arrival.  - You will not be able to drive home after your procedure.  - Do NOT miss any doses of your blood thinner - if you should miss a dose please notify our office immediately.  - If you feel as if you go back into normal rhythm prior to scheduled cardioversion, please notify our office immediately. If your procedure is canceled in the cardioversion suite you will be charged a cancellation fee.

## 2020-02-02 DIAGNOSIS — F329 Major depressive disorder, single episode, unspecified: Secondary | ICD-10-CM | POA: Diagnosis not present

## 2020-02-02 DIAGNOSIS — G4730 Sleep apnea, unspecified: Secondary | ICD-10-CM | POA: Diagnosis not present

## 2020-02-03 ENCOUNTER — Encounter (HOSPITAL_COMMUNITY)
Admission: RE | Disposition: A | Discharge status: Home / Self Care | Payer: Self-pay | Source: Home / Self Care | Attending: Internal Medicine

## 2020-02-03 ENCOUNTER — Ambulatory Visit (HOSPITAL_BASED_OUTPATIENT_CLINIC_OR_DEPARTMENT_OTHER)
Admission: RE | Admit: 2020-02-03 | Discharge: 2020-02-03 | Disposition: A | Discharge status: Home / Self Care | Payer: Medicare Other | Source: Ambulatory Visit | Attending: Nurse Practitioner | Admitting: Nurse Practitioner

## 2020-02-03 ENCOUNTER — Other Ambulatory Visit: Payer: Self-pay

## 2020-02-03 ENCOUNTER — Encounter (HOSPITAL_COMMUNITY): Payer: Self-pay | Admitting: Internal Medicine

## 2020-02-03 ENCOUNTER — Ambulatory Visit (HOSPITAL_COMMUNITY): Payer: Medicare Other | Admitting: Certified Registered"

## 2020-02-03 ENCOUNTER — Ambulatory Visit (HOSPITAL_COMMUNITY)
Admission: RE | Admit: 2020-02-03 | Discharge: 2020-02-03 | Disposition: A | Discharge status: Home / Self Care | Payer: Medicare Other | Attending: Internal Medicine | Admitting: Internal Medicine

## 2020-02-03 DIAGNOSIS — I361 Nonrheumatic tricuspid (valve) insufficiency: Secondary | ICD-10-CM

## 2020-02-03 DIAGNOSIS — Z880 Allergy status to penicillin: Secondary | ICD-10-CM | POA: Insufficient documentation

## 2020-02-03 DIAGNOSIS — I081 Rheumatic disorders of both mitral and tricuspid valves: Secondary | ICD-10-CM | POA: Insufficient documentation

## 2020-02-03 DIAGNOSIS — F32A Depression, unspecified: Secondary | ICD-10-CM | POA: Diagnosis not present

## 2020-02-03 DIAGNOSIS — E7849 Other hyperlipidemia: Secondary | ICD-10-CM | POA: Diagnosis not present

## 2020-02-03 DIAGNOSIS — I4892 Unspecified atrial flutter: Secondary | ICD-10-CM

## 2020-02-03 DIAGNOSIS — I340 Nonrheumatic mitral (valve) insufficiency: Secondary | ICD-10-CM

## 2020-02-03 DIAGNOSIS — I480 Paroxysmal atrial fibrillation: Secondary | ICD-10-CM | POA: Insufficient documentation

## 2020-02-03 DIAGNOSIS — Z882 Allergy status to sulfonamides status: Secondary | ICD-10-CM | POA: Diagnosis not present

## 2020-02-03 DIAGNOSIS — I4819 Other persistent atrial fibrillation: Secondary | ICD-10-CM

## 2020-02-03 HISTORY — PX: CARDIOVERSION: SHX1299

## 2020-02-03 HISTORY — PX: BUBBLE STUDY: SHX6837

## 2020-02-03 HISTORY — PX: TEE WITHOUT CARDIOVERSION: SHX5443

## 2020-02-03 LAB — ECHO TEE
MV M vel: 4.05 m/s
MV Peak grad: 65.6 mmHg
Radius: 0.6 cm

## 2020-02-03 SURGERY — ECHOCARDIOGRAM, TRANSESOPHAGEAL
Anesthesia: Monitor Anesthesia Care

## 2020-02-03 MED ORDER — BUTAMBEN-TETRACAINE-BENZOCAINE 2-2-14 % EX AERO
INHALATION_SPRAY | CUTANEOUS | Status: DC | PRN
  Administered 2020-02-03: 2 via TOPICAL

## 2020-02-03 MED ORDER — LACTATED RINGERS IV SOLN: INTRAVENOUS | Status: DC | PRN

## 2020-02-03 MED ORDER — PROPOFOL 500 MG/50ML IV EMUL
INTRAVENOUS | Status: DC | PRN
  Administered 2020-02-03: 150 ug/kg/min via INTRAVENOUS

## 2020-02-03 MED ORDER — PROPOFOL 10 MG/ML IV BOLUS
INTRAVENOUS | Status: DC | PRN
  Administered 2020-02-03: 30 mg via INTRAVENOUS

## 2020-02-03 NOTE — Progress Notes (Signed)
  Echocardiogram Echocardiogram Transesophageal has been performed.  Otto Felkins G Aby Gessel 02/03/2020, 11:25 AM

## 2020-02-03 NOTE — Transfer of Care (Signed)
Immediate Anesthesia Transfer of Care Note  Patient: Ashley White  Procedure(s) Performed: TRANSESOPHAGEAL ECHOCARDIOGRAM (TEE) (N/A ) CARDIOVERSION (N/A ) BUBBLE STUDY  Patient Location: Endoscopy Unit  Anesthesia Type:MAC  Level of Consciousness: drowsy  Airway & Oxygen Therapy: Patient Spontanous Breathing and Patient connected to face mask oxygen  Post-op Assessment: Report given to RN, Post -op Vital signs reviewed and stable and Patient moving all extremities  Post vital signs: Reviewed and stable  Last Vitals:  Vitals Value Taken Time  BP 95/45 02/03/20 1000  Temp    Pulse 65 02/03/20 1000  Resp 16 02/03/20 1000  SpO2 100 % 02/03/20 1000  Vitals shown include unvalidated device data.  Last Pain:  Vitals:   02/03/20 0833  TempSrc: Oral  PainSc: 0-No pain         Complications: No complications documented.

## 2020-02-03 NOTE — Anesthesia Procedure Notes (Signed)
Procedure Name: MAC Date/Time: 02/03/2020 9:14 AM Performed by: Amadeo Garnet, CRNA Pre-anesthesia Checklist: Patient identified, Emergency Drugs available, Suction available and Patient being monitored Patient Re-evaluated:Patient Re-evaluated prior to induction Oxygen Delivery Method: Nasal cannula

## 2020-02-03 NOTE — Anesthesia Preprocedure Evaluation (Signed)
Anesthesia Evaluation  Patient identified by MRN, date of birth, ID band Patient awake    Reviewed: Allergy & Precautions, H&P , NPO status , Patient's Chart, lab work & pertinent test results  Airway Mallampati: II   Neck ROM: full    Dental   Pulmonary sleep apnea ,    breath sounds clear to auscultation       Cardiovascular + dysrhythmias Atrial Fibrillation + Valvular Problems/Murmurs MVP  Rhythm:irregular Rate:Normal     Neuro/Psych PSYCHIATRIC DISORDERS Depression CVA    GI/Hepatic   Endo/Other    Renal/GU      Musculoskeletal   Abdominal   Peds  Hematology   Anesthesia Other Findings   Reproductive/Obstetrics H/o breast CA                             Anesthesia Physical Anesthesia Plan  ASA: III  Anesthesia Plan: MAC   Post-op Pain Management:    Induction: Intravenous  PONV Risk Score and Plan: 2 and Propofol infusion and Treatment may vary due to age or medical condition  Airway Management Planned: Nasal Cannula  Additional Equipment:   Intra-op Plan:   Post-operative Plan:   Informed Consent: I have reviewed the patients History and Physical, chart, labs and discussed the procedure including the risks, benefits and alternatives for the proposed anesthesia with the patient or authorized representative who has indicated his/her understanding and acceptance.       Plan Discussed with: CRNA, Anesthesiologist and Surgeon  Anesthesia Plan Comments:         Anesthesia Quick Evaluation

## 2020-02-03 NOTE — Interval H&P Note (Signed)
History and Physical Interval Note:  02/03/2020 9:12 AM  Ashley White  has presented today for surgery, with the diagnosis of AFLUTTER.  The various methods of treatment have been discussed with the patient and family. After consideration of risks, benefits and other options for treatment, the patient has consented to  Procedure(s): TRANSESOPHAGEAL ECHOCARDIOGRAM (TEE) (N/A) CARDIOVERSION (N/A) as a surgical intervention.  The patient's history has been reviewed, patient examined, no change in status, stable for surgery.  I have reviewed the patient's chart and labs.  Questions were answered to the patient's satisfaction.     [name]

## 2020-02-03 NOTE — CV Procedure (Signed)
INDICATIONS: Atrial fibrillation, rule out thrombus  PROCEDURE:   Informed consent was obtained prior to the procedure. The risks, benefits and alternatives for the procedure were discussed and the patient comprehended these risks.  Risks include, but are not limited to, cough, sore throat, vomiting, nausea, somnolence, esophageal and stomach trauma or perforation, bleeding, low blood pressure, aspiration, pneumonia, infection, trauma to the teeth and death.    After a procedural time-out, the oropharynx was anesthetized with 20% benzocaine spray.   During this procedure the patient was administered propofol per anesthesia.  The patient's heart rate, blood pressure, and oxygen saturation were monitored continuously during the procedure. The period of conscious sedation was 45 minutes, of which I was present face-to-face 100% of this time.  The transesophageal probe was inserted in the esophagus and stomach without difficulty and multiple views were obtained.  The patient was kept under observation until the patient left the procedure room.  The patient left the procedure room in stable condition.   Agitated microbubble saline contrast was administered.  COMPLICATIONS:    There were no immediate complications.  FINDINGS:   FORMAL ECHOCARDIOGRAM REPORT PENDING EF 60-65% Normal RV size and function.  No LAA thrombus. No LV apex thrombus  Moderate mitral valve regurgitation.   Mild-moderate TR.  Mild PR  Trivial AR.   No PFO  RECOMMENDATIONS:     proceed to cardioversion  Time Spent Directly with the Patient:  45 minutes   Ashley White 02/03/2020, 9:53 AM  Procedure: Electrical Cardioversion Indications:  Atrial Fibrillation  Procedure Details:  Consent: Risks of procedure as well as the alternatives and risks of each were explained to the (patient/caregiver).  Consent for procedure obtained. prior to TEE.   Time Out: Verified patient identification, verified  procedure, site/side was marked, verified correct patient position, special equipment/implants available, medications/allergies/relevent history reviewed, required imaging and test results available. PERFORMED.  Patient placed on cardiac monitor, pulse oximetry, supplemental oxygen as necessary.  Sedation given: propofol per anesthesia Pacer pads placed anterior and posterior chest.  Cardioverted 1 time(s).  Cardioversion with synchronized biphasic 120J shock.  Evaluation: Findings: Post procedure EKG shows: NSR Complications: None Patient did tolerate procedure well.  Time Spent Directly with the Patient:  15 minutes   Ashley White 02/03/2020, 9:53 AM

## 2020-02-04 DIAGNOSIS — F908 Attention-deficit hyperactivity disorder, other type: Secondary | ICD-10-CM | POA: Diagnosis not present

## 2020-02-04 DIAGNOSIS — F3181 Bipolar II disorder: Secondary | ICD-10-CM | POA: Diagnosis not present

## 2020-02-04 NOTE — Anesthesia Postprocedure Evaluation (Signed)
Anesthesia Post Note  Patient: Ashley White  Procedure(s) Performed: TRANSESOPHAGEAL ECHOCARDIOGRAM (TEE) (N/A ) CARDIOVERSION (N/A ) BUBBLE STUDY     Patient location during evaluation: Endoscopy Anesthesia Type: MAC Level of consciousness: awake and alert Pain management: pain level controlled Vital Signs Assessment: post-procedure vital signs reviewed and stable Respiratory status: spontaneous breathing, nonlabored ventilation, respiratory function stable and patient connected to nasal cannula oxygen Cardiovascular status: blood pressure returned to baseline and stable Postop Assessment: no apparent nausea or vomiting Anesthetic complications: no   No complications documented.  Last Vitals:  Vitals:   02/03/20 1008 02/03/20 1018  BP: (!) 114/43 125/70  Pulse: 70 72  Resp: 14 18  Temp:    SpO2: 100% 98%    Last Pain:  Vitals:   02/03/20 1018  TempSrc:   PainSc: 0-No pain                 Donyel Castagnola S

## 2020-02-07 ENCOUNTER — Encounter (HOSPITAL_COMMUNITY): Payer: Self-pay | Admitting: Internal Medicine

## 2020-02-08 ENCOUNTER — Ambulatory Visit (HOSPITAL_COMMUNITY)
Admission: RE | Admit: 2020-02-08 | Discharge: 2020-02-08 | Disposition: A | Discharge status: Home / Self Care | Payer: Medicare Other | Source: Ambulatory Visit | Attending: Nurse Practitioner | Admitting: Nurse Practitioner

## 2020-02-08 ENCOUNTER — Encounter (HOSPITAL_COMMUNITY): Payer: Self-pay | Admitting: Nurse Practitioner

## 2020-02-08 ENCOUNTER — Other Ambulatory Visit: Payer: Self-pay

## 2020-02-08 VITALS — BP 139/86 | Ht 65.0 in | Wt 165.6 lb

## 2020-02-08 DIAGNOSIS — Z79899 Other long term (current) drug therapy: Secondary | ICD-10-CM | POA: Diagnosis not present

## 2020-02-08 DIAGNOSIS — I4892 Unspecified atrial flutter: Secondary | ICD-10-CM

## 2020-02-08 DIAGNOSIS — I480 Paroxysmal atrial fibrillation: Secondary | ICD-10-CM | POA: Diagnosis not present

## 2020-02-08 DIAGNOSIS — D6869 Other thrombophilia: Secondary | ICD-10-CM | POA: Diagnosis not present

## 2020-02-08 DIAGNOSIS — Z6379 Other stressful life events affecting family and household: Secondary | ICD-10-CM | POA: Diagnosis not present

## 2020-02-08 DIAGNOSIS — Z7901 Long term (current) use of anticoagulants: Secondary | ICD-10-CM | POA: Insufficient documentation

## 2020-02-08 DIAGNOSIS — Z20822 Contact with and (suspected) exposure to covid-19: Secondary | ICD-10-CM | POA: Diagnosis not present

## 2020-02-08 NOTE — Progress Notes (Signed)
Primary Care Physician: [name], MD Referring Physician: ER f/u EP: Dr. Marvia Pickles Weakland is a 69 y.o. female with a h/o paroxysmal afib s/p ablation 12/2015. She presents  for f/u. She remains on flecainide. Dr. Rayann Heman said that she could wean off but she would prefer to stay on it as she has not had any afib. No bleeding issues with eliquis.  F/u in afib clinic,01/13/20. She feels well and reports no afib. She continues on flecainide. No bleeding issues with eliquis. Ekg shows SR. BP elevated on presentation, rechecked at 138/78. She is requesting thru the prompting of her PCP to have a general cardiologist follow her.   I am seeing pt today as she went into a flutter with RVR over the weekend .  She  tried 1-2 extra tabs of metoprolol succinate without returning to SR. She is on flecainide 100 mg bid . She did miss one dose of eliquis weekend prior. She  has been worried about her sister undergoing chemotherapy. She has also been trying to get affairs in order for a 2 month cruise she and her husband are going on. THis  leaves a week Wednesday.  She is stable in atrial flutter.  F/u in  afib clinic, 11/9. She had a  successful TEE guided DCCV 11/4 and remains in SR. She is leaving for her 2 month cruise on Thursday. TEE did show moderate MR. Will forward to Dr. Audie Box for his review as she recently established with him.   Today, she denies symptoms of palpitations, chest pain, shortness of breath, orthopnea, PND, lower extremity edema, dizziness, presyncope, syncope, or neurologic sequela. The patient is tolerating medications without difficulties and is otherwise without complaint today.   Past Medical History:  Diagnosis Date  . ADD (attention deficit disorder)   . Arrhythmia   . Cancer [facility]) 2001   left breast, treated with lumpectomy, radiation, and chemotherapy  . Depression   . Mitral valve prolapse   . 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 Paroxysmal atrial fibrillation (HCC)    . Sleep apnea    uses CPAP followed by Dr Maxwell Caul  . Stroke [facility]) 2015   Past Surgical History:  Procedure Laterality Date  . BREAST LUMPECTOMY  2001   malignancy removed  . BREAST LUMPECTOMY WITH RADIOACTIVE SEED LOCALIZATION Right 08/09/2013   benign  . BREAST SURGERY  1993   left cyst removed  . BUBBLE STUDY  02/03/2020   Procedure: BUBBLE STUDY;  Surgeon: [name], MD;  Location: [place];  Service: Cardiovascular;;  . CARDIOVERSION N/A 02/03/2020   Procedure: CARDIOVERSION;  Surgeon: [name], MD;  Location: [facility] ENDOSCOPY;  Service: Cardiovascular;  Laterality: N/A;  . CATARACT EXTRACTION    . ELECTROPHYSIOLOGIC STUDY N/A 01/09/2016   Procedure: Atrial Fibrillation Ablation;  Surgeon: Thompson Grayer, MD;  Location: [place] CV LAB;  Service: Cardiovascular;  Laterality: N/A;  . OOPHORECTOMY Left   . right knee tibia surgery     2006  . TEE WITHOUT CARDIOVERSION N/A 02/03/2020   Procedure: TRANSESOPHAGEAL ECHOCARDIOGRAM (TEE);  Surgeon: [name], MD;  Location: [facility] ENDOSCOPY;  Service: Cardiovascular;  Laterality: N/A;    Current Outpatient Medications  Medication Sig Dispense Refill  . apixaban (ELIQUIS) 5 MG TABS tablet TAKE 1 TABLET(5 MG) BY MOUTH TWICE DAILY (Patient taking differently: Take 5 mg by mouth 2 (two) times daily. ) 180 tablet 3  . cetirizine (ZYRTEC) 10 MG tablet Take  10 mg by mouth every evening.    . diltiazem (CARDIZEM) 30 MG tablet Take one tablet every 4 hours as needed for Heart rate greater than 100, top number blood pressure needs to be above 100 (Patient taking differently: Take 30 mg by mouth See admin instructions. Take 30 mg every 4 hours as needed for Heart rate greater than 100, top number blood pressure needs to be above 100) 45 tablet 0  . flecainide (TAMBOCOR) 100 MG tablet Take 1 tablet (100 mg total) by mouth 2 (two) times daily. 180 tablet 3  . fluticasone (CUTIVATE) 0.05 % cream Apply 1 application topically  daily as needed (itching).     [name] ketoconazole (NIZORAL) 2 % cream Apply 1 application topically daily as needed (Skin irritation).     [name] lamoTRIgine (LAMICTAL) 200 MG tablet Take 200 mg by mouth at bedtime.   5  . metoprolol succinate (TOPROL-XL) 25 MG 24 hr tablet TAKE 1 TABLET(25 MG) BY MOUTH DAILY (Patient taking differently: Take 25 mg by mouth daily. ) 90 tablet 3  . modafinil (PROVIGIL) 100 MG tablet Take 150 mg by mouth daily before breakfast.    . Omega-3 Fatty Acids (FISH OIL PO) Take 220 mg by mouth at bedtime.     . rosuvastatin (CRESTOR) 20 MG tablet Take 1 tablet (20 mg total) by mouth daily. 90 tablet 3  . vortioxetine HBr (TRINTELLIX) 20 MG TABS tablet Take 20 mg by mouth daily.      No current facility-administered medications for this encounter.    Allergies  Allergen Reactions  . Penicillin G Other (See Comments)    Childhood  . Penicillins Other (See Comments)    From childhood/unknown reaction: DID THE REACTION INVOLVE: Swelling of the face/tongue/throat, SOB, or low BP? Unk Sudden or severe rash/hives, skin peeling, or the inside of the mouth or nose? Unk Did it require medical treatment? Unk When did it last happen?Childhood If all above answers are "NO", may proceed with cephalosporin use.   . Nickel Itching and Rash    Social History   Socioeconomic History  . Marital status: Married    Spouse name: Not on file  . Number of children: 2  . Years of education: Bachelor's  . Highest education level: Not on file  Occupational History  . Occupation: Retired  Tobacco Use  . Smoking status: Never Smoker  . Smokeless tobacco: Never Used  Vaping Use  . Vaping Use: Never used  Substance and Sexual Activity  . Alcohol use: Yes    Alcohol/week: 0.0 standard drinks    Comment: 1-2 times a week (2 beers), sometimes more  . Drug use: No  . Sexual activity: Yes  Other Topics Concern  . Not on file  Social History Narrative   Patient is married.    Patient has a college education.   Patient is left handed.   Patient drinks 1.5 cups of caffeine daily.   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 Difficulty of Paying Living Expenses: Not on file  Food Insecurity:   . Worried About Charity fundraiser in the Last Year: Not on file  . Ran Out of Food in the Last Year: Not on file  Transportation Needs:   . Lack of Transportation (Medical): Not on file  . Lack of Transportation (Non-Medical): Not on file  Physical Activity:   . Days of Exercise per Week: Not on file  .  Minutes of Exercise per Session: Not on file  Stress:   . Feeling of Stress : Not on file  Social Connections:   . Frequency of Communication with Friends and Family: Not on file  . Frequency of Social Gatherings with Friends and Family: Not on file  . Attends Religious Services: Not on file  . Active Member of Clubs or Organizations: Not on file  . Attends Archivist Meetings: Not on file  . Marital Status: Not on file  Intimate Partner Violence:   . Fear of Current or Ex-Partner: Not on file  . Emotionally Abused: Not on file  . Physically Abused: Not on file  . Sexually Abused: Not on file    Family History  Problem Relation Age of Onset  . Heart disease Mother   . Colon cancer Father   . Breast cancer Paternal Grandmother     ROS- All systems are reviewed and negative except as per the HPI above  Physical Exam: Vitals:   02/08/20 0952  BP: 139/86  Weight: 75.1 kg  Height: 5\' 5"  (1.651 m)   Wt Readings from Last 3 Encounters:  02/08/20 75.1 kg  01/31/20 74.8 kg  01/25/20 74.5 kg    Labs: Lab Results  Component Value Date   NA 141 01/31/2020   K 4.8 01/31/2020   CL 102 01/31/2020   CO2 30 01/31/2020   GLUCOSE 104 (H) 01/31/2020   BUN 14 01/31/2020   CREATININE 0.73 01/31/2020   CALCIUM 9.5 01/31/2020   Lab Results  Component Value Date   INR 1.11 12/24/2013   Lab  Results  Component Value Date   CHOL 240 (H) 01/25/2020   HDL 89 01/25/2020   LDLCALC 140 (H) 01/25/2020   TRIG 65 01/25/2020     GEN- The patient is well appearing, alert and oriented x 3 today.   Head- normocephalic, atraumatic Eyes-  Sclera clear, conjunctiva pink Ears- hearing intact Oropharynx- clear Neck- supple, no JVP Lymph- no cervical lymphadenopathy Lungs- Clear to ausculation bilaterally, normal work of breathing Heart  regular rate and rhythm, no murmurs, rubs or gallops, PMI not laterally displaced GI- soft, NT, ND, + BS Extremities- no clubbing, cyanosis, or edema MS- no significant deformity or atrophy Skin- no rash or lesion Psych- euthymic mood, full affect Neuro- strength and sensation are intact  EKG-  NSR at 62 bpm, pr int 180 ms, qrs int 84 ms, qtc 456 ms   TEE- 1. Left ventricular ejection fraction, by estimation, is 60 to 65%. The  left ventricle has normal function.  2. Right ventricular systolic function is normal. The right ventricular  size is normal. There is normal pulmonary artery systolic pressure.  3. Left atrial size was moderately dilated. No left atrial/left atrial  appendage thrombus was detected. The LAA emptying velocity was 51 cm/s.  4. Right atrial size was mildly dilated.  5. The mitral valve is mildly degenerative. Moderate mitral valve  regurgitation.  6. Tricuspid valve regurgitation is mild to moderate.  7. The aortic valve is normal in structure. Aortic valve regurgitation is  not visualized. No aortic stenosis is present.  8. Indeterminate bubble study due to suboptimal injection. No definite  atrial level shunt.   Conclusion(s)/Recommendation(s): No LA/LAA thrombus identified. Successful  cardioversion performed with restoration of normal sinus rhythm.   Assessment and Plan: 1. Paroxysmal afib S/p ablation 2017 Broke thru  in atrial flutter with a v rate of 120-130's  Successful TEE guided cardioversion,   as  she  missed a dose of eliquis in the last 10 days  Staying in SR  Continue  flecainide 100 mg bid  Continue  Metoprolol 25 mg daily  Prescribed  30 mg Cardizem as needed every 4-6 hours for RVR, in case return of afib on the cruise  Continue eliquis 5 mg bid  for a CHA2DS2VASc score of 4, reminded not to miss anticoagulation for one month after DCCV  F/u with Dr. Audie Box as recall afib clinic as needed   Geroge Baseman. Ibraheem Voris, [facility] [address] [place], [place] [address] [phone]

## 2020-02-09 ENCOUNTER — Other Ambulatory Visit (HOSPITAL_COMMUNITY): Payer: Self-pay | Admitting: Nurse Practitioner

## 2020-04-11 DIAGNOSIS — F3181 Bipolar II disorder: Secondary | ICD-10-CM | POA: Diagnosis not present

## 2020-04-11 DIAGNOSIS — F908 Attention-deficit hyperactivity disorder, other type: Secondary | ICD-10-CM | POA: Diagnosis not present

## 2020-04-17 DIAGNOSIS — F3181 Bipolar II disorder: Secondary | ICD-10-CM | POA: Diagnosis not present

## 2020-04-17 DIAGNOSIS — F908 Attention-deficit hyperactivity disorder, other type: Secondary | ICD-10-CM | POA: Diagnosis not present

## 2020-04-24 DIAGNOSIS — F908 Attention-deficit hyperactivity disorder, other type: Secondary | ICD-10-CM | POA: Diagnosis not present

## 2020-04-24 DIAGNOSIS — F3181 Bipolar II disorder: Secondary | ICD-10-CM | POA: Diagnosis not present

## 2020-04-25 DIAGNOSIS — Z1231 Encounter for screening mammogram for malignant neoplasm of breast: Secondary | ICD-10-CM | POA: Diagnosis not present

## 2020-04-30 NOTE — Progress Notes (Signed)
Cardiology Office Note:   Date:  05/01/2020  NAME:  Ashley White    MRN: [id number] DOB:  [birth year]   PCP:  [name], MD  Cardiologist:  Evalina Field, MD   Referring MD: [name], MD   Chief Complaint  Patient presents with  . Atrial Flutter   History of Present Illness:   Ashley White is a 70 y.o. female with a hx of Afib s/p ablation (2017), aflutter s/p TEE/DCCV (01/2020), HLD, OSA who presents for follow-up. Developed flutter and underwent TEE/DCCV.  She recently went on her 76-month cruise.  This was in [place].  Her husband was giving several speeches on astronomy.  She apparently had an atrial flutter episode again.  I did review the records.  She was in [place].  She had atrial flutter with heart rate around 111.  This was on 03/05/2020.  She underwent a cardioversion in the hospital there.  Records have been uploaded.  She is now had 2 episodes of atrial flutter.  Likely slower as she was on flecainide.  She reports she felt another episode of her heart racing while on the cruise but this resolved.  She reports she was drinking more alcohol than usual on the cruise.  Also reports sleep patterns were a bit erratic.  She reports she is glad to be home.  Her blood pressure is 150/71.  We discussed increasing her metoprolol.  She is okay to do this.  She denies any chest pain, shortness of breath or palpitations.  When she underwent a transesophageal echocardiogram, her EF was noted to be normal.  No significant chamber enlargement.  I did review her EKGs in the office.  This shows atrial flutter.  I did review her ablation in 2017.  She underwent an atrial flutter ablation as well as A. fib ablation.  She likely is having left-sided atrial flutter.  I think will be good to have her see Dr. Thompson Grayer regarding further management as this could be an atypical flutter.  Problem List 1. Paroxysmal Afib -ablation for Afib/flutter 01/09/2016 -CHADSVASC=4 (age, female, stroke) -on  flecanide 2. Atrial flutter -01/31/2020 -TEE/DCCV 02/03/2020 -DCCV 03/05/2020 ([place]) 3. OSA on CPAP 4. HLD 5. Ehlers-Danlos  Past Medical History: Past Medical History:  Diagnosis Date  . ADD (attention deficit disorder)   . Arrhythmia   . Cancer [facility]) 2001   left breast, treated with lumpectomy, radiation, and chemotherapy  . Depression   . Mitral valve prolapse   . Myocarditis ([place])    at age 83  . Paroxysmal atrial fibrillation (HCC)   . Sleep apnea    uses CPAP followed by Dr Maxwell Caul  . Stroke [facility]) 2015    Past Surgical History: Past Surgical History:  Procedure Laterality Date  . BREAST LUMPECTOMY  2001   malignancy removed  . BREAST LUMPECTOMY WITH RADIOACTIVE SEED LOCALIZATION Right 08/09/2013   benign  . BREAST SURGERY  1993   left cyst removed  . BUBBLE STUDY  02/03/2020   Procedure: BUBBLE STUDY;  Surgeon: [name], MD;  Location: [place];  Service: Cardiovascular;;  . CARDIOVERSION N/A 02/03/2020   Procedure: CARDIOVERSION;  Surgeon: [name], MD;  Location: [facility] ENDOSCOPY;  Service: Cardiovascular;  Laterality: N/A;  . CATARACT EXTRACTION    . ELECTROPHYSIOLOGIC STUDY N/A 01/09/2016   Procedure: Atrial Fibrillation Ablation;  Surgeon: Thompson Grayer, MD;  Location: [place] CV LAB;  Service: Cardiovascular;  Laterality: N/A;  . OOPHORECTOMY Left   .  right knee tibia surgery     2006  . TEE WITHOUT CARDIOVERSION N/A 02/03/2020   Procedure: TRANSESOPHAGEAL ECHOCARDIOGRAM (TEE);  Surgeon: [name], MD;  Location: [facility] ENDOSCOPY;  Service: Cardiovascular;  Laterality: N/A;    Current Medications: Current Meds  Medication Sig  . apixaban (ELIQUIS) 5 MG TABS tablet TAKE 1 TABLET(5 MG) BY MOUTH TWICE DAILY (Patient taking differently: Take 5 mg by mouth 2 (two) times daily.)  . cetirizine (ZYRTEC) 10 MG tablet Take 10 mg by mouth every evening.  . diltiazem (CARDIZEM) 30 MG tablet TAKE 1 TABLET BY MOUTH EVERY 4 HOURS AS NEEDED FOR  HEART RATE GREATER THAN 100  . flecainide (TAMBOCOR) 100 MG tablet Take 1 tablet (100 mg total) by mouth 2 (two) times daily.  . fluticasone (CUTIVATE) 0.05 % cream Apply 1 application topically daily as needed (itching).   [name] ketoconazole (NIZORAL) 2 % cream Apply 1 application topically daily as needed (Skin irritation).   [name] lamoTRIgine (LAMICTAL) 200 MG tablet Take 200 mg by mouth at bedtime.   . modafinil (PROVIGIL) 100 MG tablet Take 150 mg by mouth daily before breakfast.  . Omega-3 Fatty Acids (FISH OIL PO) Take 220 mg by mouth at bedtime.   . vortioxetine HBr (TRINTELLIX) 20 MG TABS tablet Take 20 mg by mouth daily.   . [DISCONTINUED] metoprolol succinate (TOPROL-XL) 25 MG 24 hr tablet TAKE 1 TABLET(25 MG) BY MOUTH DAILY (Patient taking differently: Take 25 mg by mouth daily.)     Allergies:    Penicillin g and Nickel   Social History: Social History   Socioeconomic History  . Marital status: Married    Spouse name: Not on file  . Number of children: 2  . Years of education: Bachelor's  . Highest education level: Not on file  Occupational History  . Occupation: Retired  Tobacco Use  . Smoking status: Never Smoker  . Smokeless tobacco: Never Used  Vaping Use  . Vaping Use: Never used  Substance and Sexual Activity  . Alcohol use: Yes    Alcohol/week: 0.0 standard drinks    Comment: 1-2 times a week (2 beers), sometimes more  . Drug use: No  . Sexual activity: Yes  Other Topics Concern  . Not on file  Social History Narrative   Patient is married.   Patient has a college education.   Patient is left handed.   Patient drinks 1.5 cups of caffeine daily.   Lives in [place].  Retired   Investment banker, operational of Radio broadcast assistant Strain: Not on [employer] Insecurity: Not on file  Transportation Needs: Not on file  Physical Activity: Not on file  Stress: Not on file  Social Connections: Not on file     Family History: The patient's family history  includes Breast cancer in her paternal grandmother; Colon cancer in her father; Heart disease in her mother.  ROS:   All other ROS reviewed and negative. Pertinent positives noted in the HPI.     EKGs/Labs/Other Studies Reviewed:   The following studies were personally reviewed by me today:  EKG:  EKG is ordered today.  The ekg ordered today demonstrates normal sinus rhythm heart rate 67, no acute ischemic changes, no evidence of infarction, and was personally reviewed by me.   TTE 02/03/2020 1. Left ventricular ejection fraction, by estimation, is 60 to 65%. The  left ventricle has normal function.  2. Right ventricular systolic function is normal. The right ventricular  size  is normal. There is normal pulmonary artery systolic pressure.  3. Left atrial size was moderately dilated. No left atrial/left atrial  appendage thrombus was detected. The LAA emptying velocity was 51 cm/s.  4. Right atrial size was mildly dilated.  5. The mitral valve is mildly degenerative. Moderate mitral valve  regurgitation.  6. Tricuspid valve regurgitation is mild to moderate.  7. The aortic valve is normal in structure. Aortic valve regurgitation is  not visualized. No aortic stenosis is present.  8. Indeterminate bubble study due to suboptimal injection. No definite  atrial level shunt.   Recent Labs: 01/31/2020: BUN 14; Creatinine, Ser 0.73; Hemoglobin 15.2; Platelets 274; Potassium 4.8; Sodium 141; TSH 1.387   Recent Lipid Panel    Component Value Date/Time   CHOL 240 (H) 01/25/2020 0929   TRIG 65 01/25/2020 0929   HDL 89 01/25/2020 0929   CHOLHDL 2.7 01/25/2020 0929   CHOLHDL 3.6 12/25/2013 0251   VLDL 15 12/25/2013 0251   LDLCALC 140 (H) 01/25/2020 0929    Physical Exam:   VS:  BP (!) 150/71   Pulse 67   Ht 5\' 6"  (1.676 m)   Wt 161 lb 12.8 oz (73.4 kg)   SpO2 97%   BMI 26.12 kg/m    Wt Readings from Last 3 Encounters:  05/01/20 161 lb 12.8 oz (73.4 kg)  02/08/20 165 lb 9.6  oz (75.1 kg)  01/31/20 164 lb 12.8 oz (74.8 kg)    General: Well nourished, well developed, in no acute distress Head: Atraumatic, normal size  Eyes: PEERLA, EOMI  Neck: Supple, no JVD Endocrine: No thryomegaly Cardiac: Normal S1, S2; RRR; no murmurs, rubs, or gallops Lungs: Clear to auscultation bilaterally, no wheezing, rhonchi or rales  Abd: Soft, nontender, no hepatomegaly  Ext: No edema, pulses 2+ Musculoskeletal: No deformities, BUE and BLE strength normal and equal Skin: Warm and dry, no rashes   Neuro: Alert and oriented to person, place, time, and situation, CNII-XII grossly intact, no focal deficits  Psych: Normal mood and affect   ASSESSMENT:   Ashley White is a 70 y.o. female who presents for the following: 1. Atrial flutter, unspecified type ([place])   2. PAF (paroxysmal atrial fibrillation) ([place])   3. Secondary hypercoagulable state ([place])   4. Mixed hyperlipidemia   5. Primary hypertension     PLAN:   1. Atrial flutter, unspecified type ([place]) -History of atrial fibrillation and flutter.  Underwent ablation for typical a flutter and A. fib on 01/09/2016. -TEE/DCCV 02/03/2020 for flutter -DCCV 03/05/2020 ([place]) for flutter -She has now had 2 episodes of atrial flutter requiring cardioversion.  She recently had one on a cruise in [place].  She underwent typical flutter ablation and A. fib ablation in 2017.  I suspect she is now having atypical atrial flutter breakthroughs.  Flecainide is not helping her.  I have recommended evaluation by her prior electrophysiologist Dr. Thompson Grayer for ablation consideration.  He also can help [place] with antiarrhythmic medications if indicated. -For now we will continue with flecainide 100 mg twice a day and metoprolol succinate 50 mg a day.  Metoprolol increased to the high blood pressure. -CHADSVASC=4 (age, female, stroke); continue Eliquis  2. PAF (paroxysmal atrial fibrillation) ([place]) 3. Secondary hypercoagulable state ([place]) -Status  post A. fib ablation in 2017.  Issue now is atrial flutter.  See above.  Continue Eliquis 5 mg twice daily.  On flecainide and metoprolol as above.  4. Mixed hyperlipidemia -Continue Crestor.  5. Primary hypertension -  Increase metoprolol succinate to 50 mg daily.  Disposition: Return in about 6 months (around 10/29/2020).  Medication Adjustments/Labs and Tests Ordered: Current medicines are reviewed at length with the patient today.  Concerns regarding medicines are outlined above.  Orders Placed This Encounter  Procedures  . Ambulatory referral to Cardiac Electrophysiology  . EKG 12-Lead   Meds ordered this encounter  Medications  . metoprolol succinate (TOPROL-XL) 50 MG 24 hr tablet    Sig: Take 1 tablet (50 mg total) by mouth daily.    Dispense:  90 tablet    Refill:  1    Patient Instructions  Medication Instructions:  Increase Metoprolol to 50 mg daily   *If you need a refill on your cardiac medications before your next appointment, please call your pharmacy*  Follow-Up: At [facility], you and your health needs are our priority.  As part of our continuing mission to provide you with exceptional heart care, we have created designated Provider Care Teams.  These Care Teams include your primary Cardiologist (physician) and Advanced Practice Providers (APPs -  Physician Assistants and Nurse Practitioners) who all work together to provide you with the care you need, when you need it.  We recommend signing up for the patient portal called "MyChart".  Sign up information is provided on this After Visit Summary.  MyChart is used to connect with patients for Virtual Visits (Telemedicine).  Patients are able to view lab/test results, encounter notes, upcoming appointments, etc.  Non-urgent messages can be sent to your provider as well.   To learn more about what you can do with MyChart, go to [email or web address].    Your next appointment:   6 month(s)  The format for your  next appointment:   In Person  Provider:   Eleonore Chiquito, MD   Other Instructions Referral to [facility]- they will contact you to set up an appointment.     Time Spent with Patient: I have spent a total of 35 minutes with patient reviewing hospital notes, telemetry, EKGs, labs and examining the patient as well as establishing an assessment and plan that was discussed with the patient.  > 50% of time was spent in direct patient care.  Signed, Addison Naegeli. Audie Box, [place]  [address], [place], [place] [address] [phone]  05/01/2020 9:37 AM

## 2020-05-01 ENCOUNTER — Ambulatory Visit: Payer: Medicare Other | Admitting: Cardiovascular Disease

## 2020-05-01 ENCOUNTER — Other Ambulatory Visit: Payer: Self-pay

## 2020-05-01 ENCOUNTER — Encounter: Payer: Self-pay | Admitting: Cardiovascular Disease

## 2020-05-01 VITALS — BP 150/71 | HR 67 | Ht 66.0 in | Wt 161.8 lb

## 2020-05-01 DIAGNOSIS — D6869 Other thrombophilia: Secondary | ICD-10-CM

## 2020-05-01 DIAGNOSIS — E782 Mixed hyperlipidemia: Secondary | ICD-10-CM | POA: Diagnosis not present

## 2020-05-01 DIAGNOSIS — I4892 Unspecified atrial flutter: Secondary | ICD-10-CM | POA: Diagnosis not present

## 2020-05-01 DIAGNOSIS — I10 Essential (primary) hypertension: Secondary | ICD-10-CM

## 2020-05-01 DIAGNOSIS — I480 Paroxysmal atrial fibrillation: Secondary | ICD-10-CM

## 2020-05-01 DIAGNOSIS — G4733 Obstructive sleep apnea (adult) (pediatric): Secondary | ICD-10-CM | POA: Diagnosis not present

## 2020-05-01 MED ORDER — METOPROLOL SUCCINATE ER 50 MG PO TB24
50.0000 mg | ORAL_TABLET | Freq: Every day | ORAL | 1 refills | Status: DC
Start: 2020-05-01 — End: 2020-10-09

## 2020-05-01 NOTE — Patient Instructions (Signed)
Medication Instructions:  Increase Metoprolol to 50 mg daily   *If you need a refill on your cardiac medications before your next appointment, please call your pharmacy*  Follow-Up: At [facility], you and your health needs are our priority.  As part of our continuing mission to provide you with exceptional heart care, we have created designated Provider Care Teams.  These Care Teams include your primary Cardiologist (physician) and Advanced Practice Providers (APPs -  Physician Assistants and Nurse Practitioners) who all work together to provide you with the care you need, when you need it.  We recommend signing up for the patient portal called "MyChart".  Sign up information is provided on this After Visit Summary.  MyChart is used to connect with patients for Virtual Visits (Telemedicine).  Patients are able to view lab/test results, encounter notes, upcoming appointments, etc.  Non-urgent messages can be sent to your provider as well.   To learn more about what you can do with MyChart, go to [email or web address].    Your next appointment:   6 month(s)  The format for your next appointment:   In Person  Provider:   Eleonore Chiquito, MD   Other Instructions Referral to [facility]- they will contact you to set up an appointment.

## 2020-05-08 DIAGNOSIS — F908 Attention-deficit hyperactivity disorder, other type: Secondary | ICD-10-CM | POA: Diagnosis not present

## 2020-05-08 DIAGNOSIS — F3181 Bipolar II disorder: Secondary | ICD-10-CM | POA: Diagnosis not present

## 2020-05-10 DIAGNOSIS — R928 Other abnormal and inconclusive findings on diagnostic imaging of breast: Secondary | ICD-10-CM | POA: Diagnosis not present

## 2020-05-10 DIAGNOSIS — Z780 Asymptomatic menopausal state: Secondary | ICD-10-CM | POA: Diagnosis not present

## 2020-05-10 DIAGNOSIS — M85852 Other specified disorders of bone density and structure, left thigh: Secondary | ICD-10-CM | POA: Diagnosis not present

## 2020-05-15 DIAGNOSIS — F908 Attention-deficit hyperactivity disorder, other type: Secondary | ICD-10-CM | POA: Diagnosis not present

## 2020-05-15 DIAGNOSIS — F3181 Bipolar II disorder: Secondary | ICD-10-CM | POA: Diagnosis not present

## 2020-05-22 DIAGNOSIS — F908 Attention-deficit hyperactivity disorder, other type: Secondary | ICD-10-CM | POA: Diagnosis not present

## 2020-05-22 DIAGNOSIS — F3181 Bipolar II disorder: Secondary | ICD-10-CM | POA: Diagnosis not present

## 2020-05-29 ENCOUNTER — Encounter: Payer: Self-pay | Admitting: *Deleted

## 2020-05-29 ENCOUNTER — Ambulatory Visit: Payer: Medicare Other | Admitting: Internal Medicine

## 2020-05-29 ENCOUNTER — Other Ambulatory Visit: Payer: Self-pay

## 2020-05-29 ENCOUNTER — Encounter: Payer: Self-pay | Admitting: Internal Medicine

## 2020-05-29 VITALS — BP 140/68 | HR 64 | Ht 66.0 in | Wt 163.4 lb

## 2020-05-29 DIAGNOSIS — I480 Paroxysmal atrial fibrillation: Secondary | ICD-10-CM

## 2020-05-29 DIAGNOSIS — G4733 Obstructive sleep apnea (adult) (pediatric): Secondary | ICD-10-CM | POA: Diagnosis not present

## 2020-05-29 DIAGNOSIS — I4892 Unspecified atrial flutter: Secondary | ICD-10-CM | POA: Diagnosis not present

## 2020-05-29 DIAGNOSIS — Z9989 Dependence on other enabling machines and devices: Secondary | ICD-10-CM | POA: Diagnosis not present

## 2020-05-29 DIAGNOSIS — F908 Attention-deficit hyperactivity disorder, other type: Secondary | ICD-10-CM | POA: Diagnosis not present

## 2020-05-29 DIAGNOSIS — F3181 Bipolar II disorder: Secondary | ICD-10-CM | POA: Diagnosis not present

## 2020-05-29 DIAGNOSIS — D6869 Other thrombophilia: Secondary | ICD-10-CM

## 2020-05-29 DIAGNOSIS — I4891 Unspecified atrial fibrillation: Secondary | ICD-10-CM

## 2020-05-29 LAB — CBC WITH DIFFERENTIAL/PLATELET
Basophils Absolute: 0 10*3/uL (ref 0.0–0.2)
Basos: 0 %
EOS (ABSOLUTE): 0.1 10*3/uL (ref 0.0–0.4)
Eos: 1 %
Hematocrit: 42.8 % (ref 34.0–46.6)
Hemoglobin: 14.7 g/dL (ref 11.1–15.9)
Lymphocytes Absolute: 2 10*3/uL (ref 0.7–3.1)
Lymphs: 30 %
MCH: 30.6 pg (ref 26.6–33.0)
MCHC: 34.3 g/dL (ref 31.5–35.7)
MCV: 89 fL (ref 79–97)
Monocytes Absolute: 0.6 10*3/uL (ref 0.1–0.9)
Monocytes: 9 %
Neutrophils Absolute: 4.1 10*3/uL (ref 1.4–7.0)
Neutrophils: 60 %
Platelets: 234 10*3/uL (ref 150–450)
RBC: 4.81 x10E6/uL (ref 3.77–5.28)
RDW: 13.2 % (ref 11.7–15.4)
WBC: 6.9 10*3/uL (ref 3.4–10.8)

## 2020-05-29 LAB — BASIC METABOLIC PANEL
BUN/Creatinine Ratio: 21 (ref 12–28)
BUN: 13 mg/dL (ref 8–27)
CO2: 29 mmol/L (ref 20–29)
Calcium: 10.1 mg/dL (ref 8.7–10.3)
Chloride: 102 mmol/L (ref 96–106)
Creatinine, Ser: 0.62 mg/dL (ref 0.57–1.00)
Glucose: 93 mg/dL (ref 65–99)
Potassium: 4.7 mmol/L (ref 3.5–5.2)
Sodium: 140 mmol/L (ref 134–144)
eGFR: 97 mL/min/{1.73_m2} (ref >59)

## 2020-05-29 NOTE — H&P (View-Only) (Signed)
PCP: [name], MD Primary Cardiologist: Dr Marisue Ivan Primary EP: Dr Ashley White is a 70 y.o. female who presents today for routine electrophysiology followup.  Since last being seen in our clinic, the patient reports doing very well.  She has had palpitations due to atrial arrhythmias for which she is on flecainide.  She had documented atrial flutter in November and December requiring [facility] x 2.  Today, she denies symptoms of palpitations, chest pain, shortness of breath,  lower extremity edema, dizziness, presyncope, or syncope.  The patient is otherwise without complaint today.   Past Medical History:  Diagnosis Date  . ADD (attention deficit disorder)   . Arrhythmia   . Cancer [facility]) 2001   left breast, treated with lumpectomy, radiation, and chemotherapy  . Depression   . Mitral valve prolapse   . 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 Paroxysmal atrial fibrillation (HCC)   . Sleep apnea    uses CPAP followed by Dr Maxwell Caul  . Stroke [facility]) 2015   Past Surgical History:  Procedure Laterality Date  . BREAST LUMPECTOMY  2001   malignancy removed  . BREAST LUMPECTOMY WITH RADIOACTIVE SEED LOCALIZATION Right 08/09/2013   benign  . BREAST SURGERY  1993   left cyst removed  . BUBBLE STUDY  02/03/2020   Procedure: BUBBLE STUDY;  Surgeon: [name], MD;  Location: [place];  Service: Cardiovascular;;  . CARDIOVERSION N/A 02/03/2020   Procedure: CARDIOVERSION;  Surgeon: [name], MD;  Location: [facility] ENDOSCOPY;  Service: Cardiovascular;  Laterality: N/A;  . CATARACT EXTRACTION    . ELECTROPHYSIOLOGIC STUDY N/A 01/09/2016   Procedure: Atrial Fibrillation Ablation;  Surgeon: Thompson Grayer, MD;  Location: [place] CV LAB;  Service: Cardiovascular;  Laterality: N/A;  . OOPHORECTOMY Left   . right knee tibia surgery     2006  . TEE WITHOUT CARDIOVERSION N/A 02/03/2020   Procedure: TRANSESOPHAGEAL ECHOCARDIOGRAM (TEE);  Surgeon: [name], MD;  Location[facility]  ENDOSCOPY;  Service: Cardiovascular;  Laterality: N/A;    ROS- all systems are reviewed and negatives except as per HPI above  Current Outpatient Medications  Medication Sig Dispense Refill  . apixaban (ELIQUIS) 5 MG TABS tablet TAKE 1 TABLET(5 MG) BY MOUTH TWICE DAILY 180 tablet 3  . cetirizine (ZYRTEC) 10 MG tablet Take 10 mg by mouth every evening.    . diltiazem (CARDIZEM) 30 MG tablet TAKE 1 TABLET BY MOUTH EVERY 4 HOURS AS NEEDED FOR HEART RATE GREATER THAN 100 45 tablet 1  . flecainide (TAMBOCOR) 100 MG tablet Take 1 tablet (100 mg total) by mouth 2 (two) times daily. 180 tablet 3  . fluticasone (CUTIVATE) 0.05 % cream Apply 1 application topically daily as needed (itching).     [name] ketoconazole (NIZORAL) 2 % cream Apply 1 application topically daily as needed (Skin irritation).     [name] lamoTRIgine (LAMICTAL) 200 MG tablet Take 200 mg by mouth at bedtime.   5  . metoprolol succinate (TOPROL-XL) 50 MG 24 hr tablet Take 1 tablet (50 mg total) by mouth daily. 90 tablet 1  . modafinil (PROVIGIL) 100 MG tablet Take 150 mg by mouth daily before breakfast.    . Omega-3 Fatty Acids (FISH OIL PO) Take 220 mg by mouth at bedtime.     . vortioxetine HBr (TRINTELLIX) 20 MG TABS tablet Take 20 mg by mouth daily.     . rosuvastatin (CRESTOR) 20 MG tablet Take 1 tablet (20 mg  total) by mouth daily. 90 tablet 3   No current facility-administered medications for this visit.    Physical Exam: Vitals:   05/29/20 1139  BP: 140/68  Pulse: 64  SpO2: 95%  Weight: 163 lb 6.4 oz (74.1 kg)  Height: 5\' 6"  (1.676 m)    GEN- The patient is well appearing, alert and oriented x 3 today.   Head- normocephalic, atraumatic Eyes-  Sclera clear, conjunctiva pink Ears- hearing intact Oropharynx- clear Lungs-   normal work of breathing Heart- Regular rate and rhythm  GI- soft  Extremities- no clubbing, cyanosis, or edema  Wt Readings from Last 3 Encounters:  05/29/20 163 lb 6.4 oz (74.1 kg)  05/01/20 161  lb 12.8 oz (73.4 kg)  02/08/20 165 lb 9.6 oz (75.1 kg)   ekg 01/31/20- typical appearing atrial flutter  EKG tracing ordered today is personally reviewed and shows sinus rhythm  Assessment and Plan:  1. Atrial flutter/ atrial fibrillation She has done very well post abglation 2017 She did have an episode of atrial flutter in November (ekg reviewed) and December for which she required [facility]. She is on flecainide She has moderate atrial enlargement by echo with moderate MR by TEE 02/03/20 Chads2vasc score is 4.  she is anticoagulated with eliquis . Therapeutic strategies for afib and atrial flutter including medicine and ablation were discussed in detail with the patient today. Risk, benefits, and alternatives to EP study and radiofrequency ablation were also discussed in detail today. These risks include but are not limited to stroke, bleeding, vascular damage, tamponade, perforation, damage to the esophagus, lungs, and other structures, pulmonary vein stenosis, worsening renal function, and death. The patient understands these risk and wishes to proceed.  We will therefore proceed with catheter ablation at the next available time.  Carto, ICE, anesthesia are requested for the procedure.  Will also obtain prior to the procedure to exclude LAA thrombus and further evaluate atrial anatomy.  2. OSA Complaint with CPAP  3. 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  Dr Wynonia Lawman evaluted and did not feel that further workup would be required.  4. Moderate MR Repeat echo  Risks, benefits and potential toxicities for medications prescribed and/or refilled reviewed with patient today.   Thompson Grayer MD, [facility] 05/29/2020 11:40 AM

## 2020-05-29 NOTE — Progress Notes (Signed)
PCP: [name], MD Primary Cardiologist: Dr Marisue Ivan Primary EP: Dr Marvia Pickles Hunkins is a 70 y.o. female who presents today for routine electrophysiology followup.  Since last being seen in our clinic, the patient reports doing very well.  She has had palpitations due to atrial arrhythmias for which she is on flecainide.  She had documented atrial flutter in November and December requiring [facility] x 2.  Today, she denies symptoms of palpitations, chest pain, shortness of breath,  lower extremity edema, dizziness, presyncope, or syncope.  The patient is otherwise without complaint today.   Past Medical History:  Diagnosis Date  . ADD (attention deficit disorder)   . Arrhythmia   . Cancer [facility]) 2001   left breast, treated with lumpectomy, radiation, and chemotherapy  . Depression   . Mitral valve prolapse   . Myocarditis ([place])    at age 78  . Paroxysmal atrial fibrillation (HCC)   . Sleep apnea    uses CPAP followed by Dr Maxwell Caul  . Stroke [facility]) 2015   Past Surgical History:  Procedure Laterality Date  . BREAST LUMPECTOMY  2001   malignancy removed  . BREAST LUMPECTOMY WITH RADIOACTIVE SEED LOCALIZATION Right 08/09/2013   benign  . BREAST SURGERY  1993   left cyst removed  . BUBBLE STUDY  02/03/2020   Procedure: BUBBLE STUDY;  Surgeon: [name], MD;  Location: [place];  Service: Cardiovascular;;  . CARDIOVERSION N/A 02/03/2020   Procedure: CARDIOVERSION;  Surgeon: [name], MD;  Location: [facility] ENDOSCOPY;  Service: Cardiovascular;  Laterality: N/A;  . CATARACT EXTRACTION    . ELECTROPHYSIOLOGIC STUDY N/A 01/09/2016   Procedure: Atrial Fibrillation Ablation;  Surgeon: Thompson Grayer, MD;  Location: [place] CV LAB;  Service: Cardiovascular;  Laterality: N/A;  . OOPHORECTOMY Left   . right knee tibia surgery     2006  . TEE WITHOUT CARDIOVERSION N/A 02/03/2020   Procedure: TRANSESOPHAGEAL ECHOCARDIOGRAM (TEE);  Surgeon: [name], MD;  Location: [facility]  ENDOSCOPY;  Service: Cardiovascular;  Laterality: N/A;    ROS- all systems are reviewed and negatives except as per HPI above  Current Outpatient Medications  Medication Sig Dispense Refill  . apixaban (ELIQUIS) 5 MG TABS tablet TAKE 1 TABLET(5 MG) BY MOUTH TWICE DAILY 180 tablet 3  . cetirizine (ZYRTEC) 10 MG tablet Take 10 mg by mouth every evening.    . diltiazem (CARDIZEM) 30 MG tablet TAKE 1 TABLET BY MOUTH EVERY 4 HOURS AS NEEDED FOR HEART RATE GREATER THAN 100 45 tablet 1  . flecainide (TAMBOCOR) 100 MG tablet Take 1 tablet (100 mg total) by mouth 2 (two) times daily. 180 tablet 3  . fluticasone (CUTIVATE) 0.05 % cream Apply 1 application topically daily as needed (itching).     [name] ketoconazole (NIZORAL) 2 % cream Apply 1 application topically daily as needed (Skin irritation).     [name] lamoTRIgine (LAMICTAL) 200 MG tablet Take 200 mg by mouth at bedtime.   5  . metoprolol succinate (TOPROL-XL) 50 MG 24 hr tablet Take 1 tablet (50 mg total) by mouth daily. 90 tablet 1  . modafinil (PROVIGIL) 100 MG tablet Take 150 mg by mouth daily before breakfast.    . Omega-3 Fatty Acids (FISH OIL PO) Take 220 mg by mouth at bedtime.     . vortioxetine HBr (TRINTELLIX) 20 MG TABS tablet Take 20 mg by mouth daily.     . rosuvastatin (CRESTOR) 20 MG tablet Take 1 tablet (20 mg  total) by mouth daily. 90 tablet 3   No current facility-administered medications for this visit.    Physical Exam: Vitals:   05/29/20 1139  BP: 140/68  Pulse: 64  SpO2: 95%  Weight: 163 lb 6.4 oz (74.1 kg)  Height: 5\' 6"  (1.676 m)    GEN- The patient is well appearing, alert and oriented x 3 today.   Head- normocephalic, atraumatic Eyes-  Sclera clear, conjunctiva pink Ears- hearing intact Oropharynx- clear Lungs-   normal work of breathing Heart- Regular rate and rhythm  GI- soft  Extremities- no clubbing, cyanosis, or edema  Wt Readings from Last 3 Encounters:  05/29/20 163 lb 6.4 oz (74.1 kg)  05/01/20 161  lb 12.8 oz (73.4 kg)  02/08/20 165 lb 9.6 oz (75.1 kg)   ekg 01/31/20- typical appearing atrial flutter  EKG tracing ordered today is personally reviewed and shows sinus rhythm  Assessment and Plan:  1. Atrial flutter/ atrial fibrillation She has done very well post abglation 2017 She did have an episode of atrial flutter in November (ekg reviewed) and December for which she required [facility]. She is on flecainide She has moderate atrial enlargement by echo with moderate MR by TEE 02/03/20 Chads2vasc score is 4.  she is anticoagulated with eliquis . Therapeutic strategies for afib and atrial flutter including medicine and ablation were discussed in detail with the patient today. Risk, benefits, and alternatives to EP study and radiofrequency ablation were also discussed in detail today. These risks include but are not limited to stroke, bleeding, vascular damage, tamponade, perforation, damage to the esophagus, lungs, and other structures, pulmonary vein stenosis, worsening renal function, and death. The patient understands these risk and wishes to proceed.  We will therefore proceed with catheter ablation at the next available time.  Carto, ICE, anesthesia are requested for the procedure.  Will also obtain prior to the procedure to exclude LAA thrombus and further evaluate atrial anatomy.  2. OSA Complaint with CPAP  3. 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  Dr Wynonia Lawman evaluted and did not feel that further workup would be required.  4. Moderate MR Repeat echo  Risks, benefits and potential toxicities for medications prescribed and/or refilled reviewed with patient today.   Thompson Grayer MD, [facility] 05/29/2020 11:40 AM

## 2020-05-29 NOTE — Patient Instructions (Addendum)
Medication Instructions:  Your physician recommends that you continue on your current medications as directed. Please refer to the Current Medication list given to you today.  Labwork: CBC, BMP  Testing/Procedures: please schedule echo Your physician has requested that you have an echocardiogram. Echocardiography is a painless test that uses sound waves to create images of your heart. It provides your doctor with information about the size and shape of your heart and how well your heart's chambers and valves are working. This procedure takes approximately one hour. There are no restrictions for this procedure.  Your physician has recommended that you have an ablation. Catheter ablation is a medical procedure used to treat some cardiac arrhythmias (irregular heartbeats). During catheter ablation, a long, thin, flexible tube is put into a blood vessel in your groin (upper thigh), or neck. This tube is called an ablation catheter. It is then guided to your heart through the blood vessel. Radio frequency waves destroy small areas of heart tissue where abnormal heartbeats may cause an arrhythmia to start. Please see the instruction sheet given to you today.    Any Other Special Instructions Will Be Listed Below (If Applicable).  If you need a refill on your cardiac medications before your next appointment, please call your pharmacy.     Cardiac Ablation Cardiac ablation is a procedure to destroy (ablate) some heart tissue that is sending bad signals. These bad signals cause problems in heart rhythm. The heart has many areas that make these signals. If there are problems in these areas, they can make the heart beat in a way that is not normal. Destroying some tissues can help make the heart rhythm normal. Tell your doctor about:  Any allergies you have.  All medicines you are taking. These include vitamins, herbs, eye drops, creams, and over-the-counter medicines.  Any problems you or family  members have had with medicines that make you fall asleep (anesthetics).  Any blood disorders you have.  Any surgeries you have had.  Any medical conditions you have, such as kidney failure.  Whether you are pregnant or may be pregnant. What are the risks? This is a safe procedure. But problems may occur, including:  Infection.  Bruising and bleeding.  Bleeding into the chest.  Stroke or blood clots.  Damage to nearby areas of your body.  Allergies to medicines or dyes.  The need for a pacemaker if the normal system is damaged.  Failure of the procedure to treat the problem. What happens before the procedure? Medicines Ask your doctor about:  Changing or stopping your normal medicines. This is important.  Taking aspirin and ibuprofen. Do not take these medicines unless your doctor tells you to take them.  Taking other medicines, vitamins, herbs, and supplements. General instructions  Follow instructions from your doctor about what you cannot eat or drink.  Plan to have someone take you home from the hospital or clinic.  If you will be going home right after the procedure, plan to have someone with you for 24 hours.  Ask your doctor what steps will be taken to prevent infection. What happens during the procedure?  An IV tube will be put into one of your veins.  You will be given a medicine to help you relax.  The skin on your neck or groin will be numbed.  A cut (incision) will be made in your neck or groin. A needle will be put through your cut and into a large vein.  A tube (catheter) will  be put into the needle. The tube will be moved to your heart.  Dye may be put through the tube. This helps your doctor see your heart.  Small devices (electrodes) on the tube will send out signals.  A type of energy will be used to destroy some heart tissue.  The tube will be taken out.  Pressure will be held on your cut. This helps stop bleeding.  A bandage will  be put over your cut. The exact procedure may vary among doctors and hospitals.   What happens after the procedure?  You will be watched until you leave the hospital or clinic. This includes checking your heart rate, breathing rate, oxygen, and blood pressure.  Your cut will be watched for bleeding. You will need to lie still for a few hours.  Do not drive for 24 hours or as long as your doctor tells you. Summary  Cardiac ablation is a procedure to destroy some heart tissue. This is done to treat heart rhythm problems.  Tell your doctor about any medical conditions you may have. Tell him or her about all medicines you are taking to treat them.  This is a safe procedure. But problems may occur. These include infection, bruising, bleeding, and damage to nearby areas of your body.  Follow what your doctor tells you about food and drink. You may also be told to change or stop some of your medicines.  After the procedure, do not drive for 24 hours or as long as your doctor tells you. This information is not intended to replace advice given to you by your health care provider. Make sure you discuss any questions you have with your health care provider. Document Revised: 02/18/2019 Document Reviewed: 02/18/2019 Elsevier Patient Education  2021 [employer].

## 2020-06-05 DIAGNOSIS — F3181 Bipolar II disorder: Secondary | ICD-10-CM | POA: Diagnosis not present

## 2020-06-05 DIAGNOSIS — F908 Attention-deficit hyperactivity disorder, other type: Secondary | ICD-10-CM | POA: Diagnosis not present

## 2020-06-12 ENCOUNTER — Telehealth (HOSPITAL_COMMUNITY): Payer: Self-pay | Admitting: Emergency Medicine

## 2020-06-12 DIAGNOSIS — F3181 Bipolar II disorder: Secondary | ICD-10-CM | POA: Diagnosis not present

## 2020-06-12 DIAGNOSIS — F908 Attention-deficit hyperactivity disorder, other type: Secondary | ICD-10-CM | POA: Diagnosis not present

## 2020-06-12 DIAGNOSIS — F339 Major depressive disorder, recurrent, unspecified: Secondary | ICD-10-CM | POA: Diagnosis not present

## 2020-06-12 DIAGNOSIS — G3184 Mild cognitive impairment, so stated: Secondary | ICD-10-CM | POA: Diagnosis not present

## 2020-06-12 DIAGNOSIS — G4730 Sleep apnea, unspecified: Secondary | ICD-10-CM | POA: Diagnosis not present

## 2020-06-12 NOTE — Telephone Encounter (Signed)
Patient can not find instructions given to her at office appointment. Sent over [place] for her to view. Will call to see if she has further questions.  Patient verbalized understanding of plan.

## 2020-06-12 NOTE — Telephone Encounter (Signed)
Pt calling with questions about CT and ablation appts   Reinforced instructions about PV CTA and informed Tina (allreds nurse) that she might have to call to give instructions for the ablation  Marchia Bond RN Navigator Cardiac [facility] [phone] Office  [phone] Cell

## 2020-06-12 NOTE — Telephone Encounter (Signed)
Called patient back and she was able to find her hard copies at home. Answered all questions.   Thank you  Otila Kluver

## 2020-06-12 NOTE — Telephone Encounter (Signed)
Reaching out to patient to offer assistance regarding upcoming cardiac imaging study; pt verbalizes understanding of appt date/time, parking situation and where to check in, pre-test NPO status and medications ordered, and verified current allergies; name and call back number provided for further questions should they arise Janiesha Diehl RN Navigator Cardiac [facility] [phone] office [phone] cell 

## 2020-06-13 ENCOUNTER — Other Ambulatory Visit: Payer: Self-pay

## 2020-06-13 ENCOUNTER — Encounter (HOSPITAL_COMMUNITY): Payer: Self-pay

## 2020-06-13 ENCOUNTER — Ambulatory Visit (HOSPITAL_COMMUNITY)
Admission: RE | Admit: 2020-06-13 | Discharge: 2020-06-13 | Disposition: A | Discharge status: Home / Self Care | Payer: Medicare Other | Source: Ambulatory Visit | Attending: Internal Medicine | Admitting: Internal Medicine

## 2020-06-13 DIAGNOSIS — I4891 Unspecified atrial fibrillation: Secondary | ICD-10-CM | POA: Diagnosis not present

## 2020-06-13 MED ORDER — IOHEXOL 350 MG/ML SOLN
80.0000 mL | Freq: Once | INTRAVENOUS | Status: AC | PRN
  Administered 2020-06-13: 80 mL via INTRAVENOUS

## 2020-06-19 ENCOUNTER — Other Ambulatory Visit (HOSPITAL_COMMUNITY)
Admission: RE | Admit: 2020-06-19 | Discharge: 2020-06-19 | Disposition: A | Discharge status: Home / Self Care | Payer: Medicare Other | Source: Ambulatory Visit | Attending: Internal Medicine | Admitting: Internal Medicine

## 2020-06-19 DIAGNOSIS — F908 Attention-deficit hyperactivity disorder, other type: Secondary | ICD-10-CM | POA: Diagnosis not present

## 2020-06-19 DIAGNOSIS — Z01812 Encounter for preprocedural laboratory examination: Secondary | ICD-10-CM | POA: Diagnosis not present

## 2020-06-19 DIAGNOSIS — F3181 Bipolar II disorder: Secondary | ICD-10-CM | POA: Diagnosis not present

## 2020-06-19 DIAGNOSIS — Z20822 Contact with and (suspected) exposure to covid-19: Secondary | ICD-10-CM | POA: Insufficient documentation

## 2020-06-19 LAB — SARS CORONAVIRUS 2 (TAT 6-24 HRS): SARS Coronavirus 2: NEGATIVE

## 2020-06-19 NOTE — Anesthesia Preprocedure Evaluation (Addendum)
Anesthesia Evaluation  Patient identified by MRN, date of birth, ID band Patient awake    Reviewed: Allergy & Precautions, H&P , NPO status , Patient's Chart, lab work & pertinent test results  Airway Mallampati: II  TM Distance: >3 FB Neck ROM: Full    Dental no notable dental hx. (+) Teeth Intact, Dental Advisory Given   Pulmonary sleep apnea and Continuous Positive Airway Pressure Ventilation ,    Pulmonary exam normal breath sounds clear to auscultation       Cardiovascular Exercise Tolerance: Good + dysrhythmias Atrial Fibrillation + Valvular Problems/Murmurs MVP and MR  Rhythm:Regular Rate:Normal     Neuro/Psych Depression CVA, No Residual Symptoms    GI/Hepatic negative GI ROS, Neg liver ROS,   Endo/Other  negative endocrine ROS  Renal/GU negative Renal ROS  negative genitourinary   Musculoskeletal   Abdominal   Peds  Hematology negative hematology ROS (+)   Anesthesia Other Findings   Reproductive/Obstetrics negative OB ROS                            Anesthesia Physical Anesthesia Plan  ASA: III  Anesthesia Plan: General   Post-op Pain Management:    Induction: Intravenous  PONV Risk Score and Plan: 4 or greater and Ondansetron, Dexamethasone and Midazolam  Airway Management Planned: Oral ETT  Additional Equipment:   Intra-op Plan:   Post-operative Plan: Extubation in OR  Informed Consent: I have reviewed the patients History and Physical, chart, labs and discussed the procedure including the risks, benefits and alternatives for the proposed anesthesia with the patient or authorized representative who has indicated his/her understanding and acceptance.     Dental advisory given  Plan Discussed with: CRNA  Anesthesia Plan Comments:        Anesthesia Quick Evaluation

## 2020-06-19 NOTE — Pre-Procedure Instructions (Signed)
Instructed patient on the following items: Arrival time 0530 Nothing to eat or drink after midnight No meds AM of procedure Responsible person to drive you home and stay with you for 24 hrs  Have you missed any doses of anti-coagulant Eliquis- hasn't missed doses    

## 2020-06-20 ENCOUNTER — Ambulatory Visit (HOSPITAL_COMMUNITY): Payer: Medicare Other | Admitting: Anesthesiology

## 2020-06-20 ENCOUNTER — Other Ambulatory Visit: Payer: Self-pay

## 2020-06-20 ENCOUNTER — Encounter (HOSPITAL_COMMUNITY)
Admission: RE | Disposition: A | Discharge status: Home / Self Care | Payer: Self-pay | Source: Home / Self Care | Attending: Internal Medicine

## 2020-06-20 ENCOUNTER — Ambulatory Visit (HOSPITAL_COMMUNITY)
Admission: RE | Admit: 2020-06-20 | Discharge: 2020-06-20 | Disposition: A | Discharge status: Home / Self Care | Payer: Medicare Other | Attending: Internal Medicine | Admitting: Internal Medicine

## 2020-06-20 ENCOUNTER — Encounter (HOSPITAL_COMMUNITY): Payer: Self-pay | Admitting: Internal Medicine

## 2020-06-20 DIAGNOSIS — E785 Hyperlipidemia, unspecified: Secondary | ICD-10-CM | POA: Diagnosis not present

## 2020-06-20 DIAGNOSIS — I4819 Other persistent atrial fibrillation: Secondary | ICD-10-CM | POA: Diagnosis not present

## 2020-06-20 DIAGNOSIS — G4733 Obstructive sleep apnea (adult) (pediatric): Secondary | ICD-10-CM | POA: Insufficient documentation

## 2020-06-20 DIAGNOSIS — Z79899 Other long term (current) drug therapy: Secondary | ICD-10-CM | POA: Diagnosis not present

## 2020-06-20 DIAGNOSIS — I340 Nonrheumatic mitral (valve) insufficiency: Secondary | ICD-10-CM | POA: Diagnosis not present

## 2020-06-20 DIAGNOSIS — I484 Atypical atrial flutter: Secondary | ICD-10-CM | POA: Diagnosis not present

## 2020-06-20 DIAGNOSIS — Z9989 Dependence on other enabling machines and devices: Secondary | ICD-10-CM | POA: Diagnosis not present

## 2020-06-20 DIAGNOSIS — I480 Paroxysmal atrial fibrillation: Secondary | ICD-10-CM | POA: Diagnosis not present

## 2020-06-20 DIAGNOSIS — Z7901 Long term (current) use of anticoagulants: Secondary | ICD-10-CM | POA: Diagnosis not present

## 2020-06-20 HISTORY — PX: ATRIAL FIBRILLATION ABLATION: EP1191

## 2020-06-20 LAB — POCT ACTIVATED CLOTTING TIME: Activated Clotting Time: 350 seconds

## 2020-06-20 SURGERY — ATRIAL FIBRILLATION ABLATION
Anesthesia: General

## 2020-06-20 MED ORDER — PROPOFOL 10 MG/ML IV BOLUS
INTRAVENOUS | Status: DC | PRN
  Administered 2020-06-20: 30 mg via INTRAVENOUS
  Administered 2020-06-20: 110 mg via INTRAVENOUS

## 2020-06-20 MED ORDER — HYDROCODONE-ACETAMINOPHEN 5-325 MG PO TABS: 1.0000 | ORAL_TABLET | ORAL | Status: DC | PRN

## 2020-06-20 MED ORDER — APIXABAN 5 MG PO TABS
5.0000 mg | ORAL_TABLET | ORAL | Status: AC
  Administered 2020-06-20: 5 mg via ORAL
  Filled 2020-06-20: qty 1

## 2020-06-20 MED ORDER — HEPARIN (PORCINE) IN NACL 1000-0.9 UT/500ML-% IV SOLN
INTRAVENOUS | Status: DC | PRN
  Administered 2020-06-20: 500 mL

## 2020-06-20 MED ORDER — CELECOXIB 200 MG PO CAPS: 200.0000 mg | ORAL_CAPSULE | Freq: Once | ORAL | Status: AC

## 2020-06-20 MED ORDER — HEPARIN SODIUM (PORCINE) 1000 UNIT/ML IJ SOLN
INTRAMUSCULAR | Status: AC
  Filled 2020-06-20: qty 2

## 2020-06-20 MED ORDER — SODIUM CHLORIDE 0.9 % IV SOLN: INTRAVENOUS | Status: DC

## 2020-06-20 MED ORDER — FENTANYL CITRATE (PF) 250 MCG/5ML IJ SOLN
INTRAMUSCULAR | Status: DC | PRN
  Administered 2020-06-20 (×2): 50 ug via INTRAVENOUS

## 2020-06-20 MED ORDER — LIDOCAINE 2% (20 MG/ML) 5 ML SYRINGE
INTRAMUSCULAR | Status: DC | PRN
  Administered 2020-06-20: 60 mg via INTRAVENOUS

## 2020-06-20 MED ORDER — ROCURONIUM BROMIDE 10 MG/ML (PF) SYRINGE
PREFILLED_SYRINGE | INTRAVENOUS | Status: DC | PRN
  Administered 2020-06-20: 20 mg via INTRAVENOUS
  Administered 2020-06-20: 50 mg via INTRAVENOUS

## 2020-06-20 MED ORDER — PHENYLEPHRINE 40 MCG/ML (10ML) SYRINGE FOR IV PUSH (FOR BLOOD PRESSURE SUPPORT)
PREFILLED_SYRINGE | INTRAVENOUS | Status: DC | PRN
  Administered 2020-06-20: 120 ug via INTRAVENOUS

## 2020-06-20 MED ORDER — HEPARIN (PORCINE) IN NACL 1000-0.9 UT/500ML-% IV SOLN
INTRAVENOUS | Status: AC
  Filled 2020-06-20: qty 500

## 2020-06-20 MED ORDER — SODIUM CHLORIDE 0.9% FLUSH: 3.0000 mL | INTRAVENOUS | Status: DC | PRN

## 2020-06-20 MED ORDER — CELECOXIB 200 MG PO CAPS
ORAL_CAPSULE | ORAL | Status: AC
  Administered 2020-06-20: 200 mg via ORAL
  Filled 2020-06-20: qty 1

## 2020-06-20 MED ORDER — SODIUM CHLORIDE 0.9 % IV SOLN: 250.0000 mL | INTRAVENOUS | Status: DC | PRN

## 2020-06-20 MED ORDER — EPHEDRINE SULFATE-NACL 50-0.9 MG/10ML-% IV SOSY
PREFILLED_SYRINGE | INTRAVENOUS | Status: DC | PRN
  Administered 2020-06-20: 5 mg via INTRAVENOUS
  Administered 2020-06-20: 10 mg via INTRAVENOUS

## 2020-06-20 MED ORDER — HEPARIN SODIUM (PORCINE) 1000 UNIT/ML IJ SOLN
INTRAMUSCULAR | Status: DC | PRN
  Administered 2020-06-20: 15000 [IU] via INTRAVENOUS

## 2020-06-20 MED ORDER — ACETAMINOPHEN 500 MG PO TABS
1000.0000 mg | ORAL_TABLET | Freq: Once | ORAL | Status: AC
  Administered 2020-06-20: 1000 mg via ORAL
  Filled 2020-06-20 (×2): qty 2

## 2020-06-20 MED ORDER — SUGAMMADEX SODIUM 200 MG/2ML IV SOLN
INTRAVENOUS | Status: DC | PRN
  Administered 2020-06-20: 200 mg via INTRAVENOUS

## 2020-06-20 MED ORDER — PANTOPRAZOLE SODIUM 40 MG PO TBEC: 40.0000 mg | DELAYED_RELEASE_TABLET | Freq: Every day | ORAL | 0 refills | Status: DC

## 2020-06-20 MED ORDER — SODIUM CHLORIDE 0.9% FLUSH: 3.0000 mL | Freq: Two times a day (BID) | INTRAVENOUS | Status: DC

## 2020-06-20 MED ORDER — PROTAMINE SULFATE 10 MG/ML IV SOLN
INTRAVENOUS | Status: DC | PRN
  Administered 2020-06-20: 40 mg via INTRAVENOUS

## 2020-06-20 MED ORDER — ONDANSETRON HCL 4 MG/2ML IJ SOLN
INTRAMUSCULAR | Status: DC | PRN
  Administered 2020-06-20: 4 mg via INTRAVENOUS

## 2020-06-20 MED ORDER — HEPARIN SODIUM (PORCINE) 1000 UNIT/ML IJ SOLN
INTRAMUSCULAR | Status: DC | PRN
  Administered 2020-06-20: 1000 [IU] via INTRAVENOUS

## 2020-06-20 MED ORDER — DEXAMETHASONE SODIUM PHOSPHATE 10 MG/ML IJ SOLN
INTRAMUSCULAR | Status: DC | PRN
  Administered 2020-06-20: 5 mg via INTRAVENOUS

## 2020-06-20 MED ORDER — ACETAMINOPHEN 325 MG PO TABS: 650.0000 mg | ORAL_TABLET | ORAL | Status: DC | PRN

## 2020-06-20 MED ORDER — ISOPROTERENOL HCL 0.2 MG/ML IJ SOLN
INTRAMUSCULAR | Status: AC
  Filled 2020-06-20: qty 5

## 2020-06-20 MED ORDER — ONDANSETRON HCL 4 MG/2ML IJ SOLN: 4.0000 mg | Freq: Four times a day (QID) | INTRAMUSCULAR | Status: DC | PRN

## 2020-06-20 MED ORDER — PHENYLEPHRINE HCL-NACL 10-0.9 MG/250ML-% IV SOLN
INTRAVENOUS | Status: DC | PRN
  Administered 2020-06-20: 20 ug/min via INTRAVENOUS

## 2020-06-20 MED ORDER — ISOPROTERENOL HCL 0.2 MG/ML IJ SOLN
INTRAVENOUS | Status: DC | PRN
  Administered 2020-06-20: 20 ug/min via INTRAVENOUS

## 2020-06-20 MED ORDER — MIDAZOLAM HCL 2 MG/2ML IJ SOLN
INTRAMUSCULAR | Status: DC | PRN
  Administered 2020-06-20: 1 mg via INTRAVENOUS

## 2020-06-20 SURGICAL SUPPLY — 18 items
BLANKET WARM UNDERBOD FULL ACC (MISCELLANEOUS) ×2 IMPLANT
CATH MAPPNG PENTARAY F 2-6-2MM (CATHETERS) ×1 IMPLANT
CATH SMTCH THERMOCOOL SF DF (CATHETERS) ×2 IMPLANT
CATH SOUNDSTAR ECO 8FR (CATHETERS) ×2 IMPLANT
CATH WEB BI DIR CSDF CRV REPRO (CATHETERS) ×2 IMPLANT
CLOSURE PERCLOSE PROSTYLE (VASCULAR PRODUCTS) ×8 IMPLANT
COVER SWIFTLINK CONNECTOR (BAG) ×2 IMPLANT
NEEDLE BAYLIS TRANSSEPTAL 71CM (NEEDLE) ×2 IMPLANT
PACK EP LATEX FREE (CUSTOM PROCEDURE TRAY) ×2
PACK EP LF (CUSTOM PROCEDURE TRAY) ×1 IMPLANT
PAD PRO RADIOLUCENT 2001M-C (PAD) ×2 IMPLANT
PATCH CARTO3 (PAD) ×2 IMPLANT
PENTARAY F 2-6-2MM (CATHETERS) ×2
SHEATH PINNACLE 7F 10CM (SHEATH) ×4 IMPLANT
SHEATH PINNACLE 9F 10CM (SHEATH) ×2 IMPLANT
SHEATH PROBE COVER 6X72 (BAG) ×2 IMPLANT
SHEATH SWARTZ TS SL2 63CM 8.5F (SHEATH) ×2 IMPLANT
TUBING SMART ABLATE COOLFLOW (TUBING) ×2 IMPLANT

## 2020-06-20 NOTE — Anesthesia Postprocedure Evaluation (Signed)
Anesthesia Post Note  Patient: Ashley White  Procedure(s) Performed: ATRIAL FIBRILLATION ABLATION (N/A )     Patient location during evaluation: Cath Lab Anesthesia Type: General Level of consciousness: awake and alert Pain management: pain level controlled Vital Signs Assessment: post-procedure vital signs reviewed and stable Respiratory status: spontaneous breathing, nonlabored ventilation, respiratory function stable and patient connected to nasal cannula oxygen Cardiovascular status: blood pressure returned to baseline and stable Postop Assessment: no apparent nausea or vomiting Anesthetic complications: no   No complications documented.  Last Vitals:  Vitals:   06/20/20 1245 06/20/20 1300  BP: (!) 130/58 (!) 124/48  Pulse: 65 68  Resp: 15 17  Temp:    SpO2: 92% 94%    Last Pain:  Vitals:   06/20/20 1023  TempSrc:   PainSc: 0-No pain                 Maxamus Colao,W. EDMOND

## 2020-06-20 NOTE — Discharge Instructions (Signed)

## 2020-06-20 NOTE — Anesthesia Procedure Notes (Signed)

## 2020-06-20 NOTE — Interval H&P Note (Signed)
History and Physical Interval Note:  06/20/2020 7:20 AM  Ashley White  has presented today for surgery, with the diagnosis of afib.  The various methods of treatment have been discussed with the patient and family. After consideration of risks, benefits and other options for treatment, the patient has consented to  Procedure(s): ATRIAL FIBRILLATION ABLATION (N/A) as a surgical intervention.  The patient's history has been reviewed, patient examined, no change in status, stable for surgery.  I have reviewed the patient's chart and labs.  Questions were answered to the patient's satisfaction.     Cardiac CT reviewed with her at length.  She reports compliance with eliquis.  Risk, benefits, and alternatives to EP study and radiofrequency ablation for afib were also discussed in detail today. These risks include but are not limited to stroke, bleeding, vascular damage, tamponade, perforation, damage to the esophagus, lungs, and other structures, pulmonary vein stenosis, worsening renal function, and death. The patient understands these risk and wishes to proceed.     Thompson Grayer

## 2020-06-20 NOTE — Transfer of Care (Signed)
Immediate Anesthesia Transfer of Care Note  Patient: Ashley White  Procedure(s) Performed: ATRIAL FIBRILLATION ABLATION (N/A )  Patient Location: Cath Lab  Anesthesia Type:General  Level of Consciousness: awake, alert  and oriented  Airway & Oxygen Therapy: Patient Spontanous Breathing and Patient connected to nasal cannula oxygen  Post-op Assessment: Report given to RN and Post -op Vital signs reviewed and stable  Post vital signs: Reviewed and stable  Last Vitals:  Vitals Value Taken Time  BP 142/52 06/20/20 0939  Temp 36.1 C 06/20/20 0940  Pulse 72 06/20/20 0941  Resp 11 06/20/20 0941  SpO2 99 % 06/20/20 0941  Vitals shown include unvalidated device data.  Last Pain:  Vitals:   06/20/20 0940  TempSrc: Temporal  PainSc: Asleep         Complications: No complications documented.

## 2020-06-22 ENCOUNTER — Other Ambulatory Visit: Payer: Self-pay

## 2020-06-22 ENCOUNTER — Ambulatory Visit (HOSPITAL_COMMUNITY): Payer: Medicare Other | Attending: Cardiovascular Disease

## 2020-06-22 DIAGNOSIS — G4733 Obstructive sleep apnea (adult) (pediatric): Secondary | ICD-10-CM

## 2020-06-22 DIAGNOSIS — Z9989 Dependence on other enabling machines and devices: Secondary | ICD-10-CM | POA: Insufficient documentation

## 2020-06-22 DIAGNOSIS — I4891 Unspecified atrial fibrillation: Secondary | ICD-10-CM | POA: Diagnosis not present

## 2020-06-22 DIAGNOSIS — D6869 Other thrombophilia: Secondary | ICD-10-CM

## 2020-06-22 DIAGNOSIS — I480 Paroxysmal atrial fibrillation: Secondary | ICD-10-CM

## 2020-06-22 DIAGNOSIS — I4892 Unspecified atrial flutter: Secondary | ICD-10-CM | POA: Diagnosis not present

## 2020-06-22 LAB — ECHOCARDIOGRAM COMPLETE
Area-P 1/2: 2.84 cm2
MV M vel: 5.65 m/s
MV Peak grad: 127.7 mmHg
MV VTI: 2.08 cm2
Radius: 0.7 cm
S' Lateral: 2.1 cm

## 2020-06-26 DIAGNOSIS — F3181 Bipolar II disorder: Secondary | ICD-10-CM | POA: Diagnosis not present

## 2020-06-26 DIAGNOSIS — F908 Attention-deficit hyperactivity disorder, other type: Secondary | ICD-10-CM | POA: Diagnosis not present

## 2020-07-03 DIAGNOSIS — F908 Attention-deficit hyperactivity disorder, other type: Secondary | ICD-10-CM | POA: Diagnosis not present

## 2020-07-03 DIAGNOSIS — F3181 Bipolar II disorder: Secondary | ICD-10-CM | POA: Diagnosis not present

## 2020-07-10 DIAGNOSIS — F908 Attention-deficit hyperactivity disorder, other type: Secondary | ICD-10-CM | POA: Diagnosis not present

## 2020-07-10 DIAGNOSIS — F3181 Bipolar II disorder: Secondary | ICD-10-CM | POA: Diagnosis not present

## 2020-07-19 ENCOUNTER — Encounter (HOSPITAL_COMMUNITY): Payer: Self-pay | Admitting: Nurse Practitioner

## 2020-07-19 ENCOUNTER — Ambulatory Visit (HOSPITAL_COMMUNITY)
Admission: RE | Admit: 2020-07-19 | Discharge: 2020-07-19 | Disposition: A | Discharge status: Home / Self Care | Payer: Medicare Other | Source: Ambulatory Visit | Attending: Nurse Practitioner | Admitting: Nurse Practitioner

## 2020-07-19 ENCOUNTER — Other Ambulatory Visit: Payer: Self-pay

## 2020-07-19 VITALS — BP 144/72 | HR 65 | Ht 64.0 in | Wt 164.8 lb

## 2020-07-19 DIAGNOSIS — Z7901 Long term (current) use of anticoagulants: Secondary | ICD-10-CM | POA: Insufficient documentation

## 2020-07-19 DIAGNOSIS — Z79899 Other long term (current) drug therapy: Secondary | ICD-10-CM | POA: Insufficient documentation

## 2020-07-19 DIAGNOSIS — I480 Paroxysmal atrial fibrillation: Secondary | ICD-10-CM | POA: Diagnosis not present

## 2020-07-19 DIAGNOSIS — D6869 Other thrombophilia: Secondary | ICD-10-CM | POA: Diagnosis not present

## 2020-07-19 DIAGNOSIS — I081 Rheumatic disorders of both mitral and tricuspid valves: Secondary | ICD-10-CM | POA: Diagnosis not present

## 2020-07-19 NOTE — Progress Notes (Signed)
Primary Care Physician: [name], MD Referring Physician: ER f/u EP: Dr. Marvia Pickles Vanzee is a 70 y.o. female with a h/o paroxysmal afib s/p ablation 12/2015. She presents  for f/u. She remains on flecainide. Dr. Rayann Heman said that she could wean off but she would prefer to stay on it as she has not had any afib. No bleeding issues with eliquis.  F/u in afib clinic,01/13/20. She feels well and reports no afib. She continues on flecainide. No bleeding issues with eliquis. Ekg shows SR. BP elevated on presentation, rechecked at 138/78. She is requesting thru the prompting of her PCP to have a general cardiologist follow her.   I am seeing pt today as she went into a flutter with RVR over the weekend .  She  tried 1-2 extra tabs of metoprolol succinate without returning to SR. She is on flecainide 100 mg bid . She did miss one dose of eliquis weekend prior. She  has been worried about her sister undergoing chemotherapy. She has also been trying to get affairs in order for a 2 month cruise she and her husband are going on. THis  leaves a week Wednesday.  She is stable in atrial flutter.  F/u in  afib clinic, 11/9. She had a  successful TEE guided DCCV 11/4 and remains in SR. She is leaving for her 2 month cruise on Thursday. TEE did show moderate MR. Will forward to Dr. Audie Box for his review as she recently established with him.   F/u one month s/p ablation 06/20/20. She has not noted any afib since the procedure. No swallowing or groin issues. Continues  on eliquis 5 mg bid for CHA2DS2VASc score of at least 4.  No bleeding issues. She and her husband are leaving on another cruise next week to [place]   Today, she denies symptoms of palpitations, chest pain, shortness of breath, orthopnea, PND, lower extremity edema, dizziness, presyncope, syncope, or neurologic sequela. The patient is tolerating medications without difficulties and is otherwise without complaint today.   Past Medical History:   Diagnosis Date  . ADD (attention deficit disorder)   . Arrhythmia   . Cancer [facility]) 2001   left breast, treated with lumpectomy, radiation, and chemotherapy  . Depression   . Mitral valve prolapse   . 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 Paroxysmal atrial fibrillation (HCC)   . Sleep apnea    uses CPAP followed by Dr Maxwell Caul  . Stroke [facility]) 2015   Past Surgical History:  Procedure Laterality Date  . ATRIAL FIBRILLATION ABLATION N/A 06/20/2020   Procedure: ATRIAL FIBRILLATION ABLATION;  Surgeon: Thompson Grayer, MD;  Location: [facility];  Service: Cardiovascular;  Laterality: N/A;  . BREAST LUMPECTOMY  2001   malignancy removed  . BREAST LUMPECTOMY WITH RADIOACTIVE SEED LOCALIZATION Right 08/09/2013   benign  . BREAST SURGERY  1993   left cyst removed  . BUBBLE STUDY  02/03/2020   Procedure: BUBBLE STUDY;  Surgeon: [name], MD;  Location: [place];  Service: Cardiovascular;;  . CARDIOVERSION N/A 02/03/2020   Procedure: CARDIOVERSION;  Surgeon: [name], MD;  Location: [facility] ENDOSCOPY;  Service: Cardiovascular;  Laterality: N/A;  . CATARACT EXTRACTION    . ELECTROPHYSIOLOGIC STUDY N/A 01/09/2016   Procedure: Atrial Fibrillation Ablation;  Surgeon: Thompson Grayer, MD;  Location: [place] [facility];  Service: Cardiovascular;  Laterality: N/A;  . OOPHORECTOMY Left   . right knee tibia surgery  2006  . TEE WITHOUT CARDIOVERSION N/A 02/03/2020   Procedure: TRANSESOPHAGEAL ECHOCARDIOGRAM (TEE);  Surgeon: [name], MD;  Location: [facility] ENDOSCOPY;  Service: Cardiovascular;  Laterality: N/A;    Current Outpatient Medications  Medication Sig Dispense Refill  . apixaban (ELIQUIS) 5 MG TABS tablet TAKE 1 TABLET(5 MG) BY MOUTH TWICE DAILY 180 tablet 3  . cetirizine (ZYRTEC) 10 MG tablet Take 10 mg by mouth daily as needed for allergies.    [name] diltiazem (CARDIZEM) 30 MG tablet TAKE 1 TABLET BY MOUTH EVERY 4 HOURS AS NEEDED FOR HEART RATE GREATER THAN 100 45  tablet 1  . flecainide (TAMBOCOR) 100 MG tablet Take 1 tablet (100 mg total) by mouth 2 (two) times daily. 180 tablet 3  . fluticasone (CUTIVATE) 0.05 % cream Apply 1 application topically daily as needed (itching).     [name] ketoconazole (NIZORAL) 2 % cream Apply 1 application topically daily as needed (Skin irritation).     [name] lamoTRIgine (LAMICTAL) 200 MG tablet Take 200 mg by mouth at bedtime.   5  . metoprolol succinate (TOPROL-XL) 50 MG 24 hr tablet Take 1 tablet (50 mg total) by mouth daily. 90 tablet 1  . modafinil (PROVIGIL) 100 MG tablet Take 150 mg by mouth daily before breakfast.    . Omega-3 Fatty Acids (FISH OIL PO) Take 2,200 mg by mouth at bedtime.    . pantoprazole (PROTONIX) 40 MG tablet Take 1 tablet (40 mg total) by mouth daily. 45 tablet 0  . rosuvastatin (CRESTOR) 20 MG tablet Take 1 tablet (20 mg total) by mouth daily. 90 tablet 3  . tetrahydrozoline-zinc (VISINE-AC) 0.05-0.25 % ophthalmic solution 2 drops 3 (three) times daily as needed (allergies).    . vortioxetine HBr (TRINTELLIX) 20 MG TABS tablet Take 20 mg by mouth in the morning.     No current facility-administered medications for this encounter.    Allergies  Allergen Reactions  . Penicillin G Other (See Comments)    Childhood  . Nickel Itching and Rash    Social History   Socioeconomic History  . Marital status: Married    Spouse name: Not on file  . Number of children: 2  . Years of education: Bachelor's  . Highest education level: Not on file  Occupational History  . Occupation: Retired  Tobacco Use  . Smoking status: Never Smoker  . Smokeless tobacco: Never Used  Vaping Use  . Vaping Use: Never used  Substance and Sexual Activity  . Alcohol use: Yes    Alcohol/week: 1.0 standard drink    Types: 1 Standard drinks or equivalent per week    Comment: 1-2 times a week (2 beers), sometimes more  . Drug use: No  . Sexual activity: Yes  Other Topics Concern  . Not on file  Social History  Narrative   Patient is married.   Patient has a college education.   Patient is left handed.   Patient drinks 1.5 cups of caffeine daily.   Lives in [place].  Retired   Investment banker, operational of Radio broadcast assistant Strain: Not on [employer] Insecurity: Not on file  Transportation Needs: Not on file  Physical Activity: Not on file  Stress: Not on file  Social Connections: Not on file  Intimate Partner Violence: Not on file    Family History  Problem Relation Age of Onset  . Heart disease Mother   . Colon cancer Father   . Breast cancer Paternal Grandmother  ROS- All systems are reviewed and negative except as per the HPI above  Physical Exam: Vitals:   07/19/20 1039  BP: (!) 144/72  Pulse: 65  Weight: 74.8 kg  Height: 5\' 4"  (1.626 m)   Wt Readings from Last 3 Encounters:  07/19/20 74.8 kg  06/20/20 72.6 kg  05/29/20 74.1 kg    Labs: Lab Results  Component Value Date   NA 140 05/29/2020   K 4.7 05/29/2020   CL 102 05/29/2020   CO2 29 05/29/2020   GLUCOSE 93 05/29/2020   BUN 13 05/29/2020   CREATININE 0.62 05/29/2020   CALCIUM 10.1 05/29/2020   Lab Results  Component Value Date   INR 1.11 12/24/2013   Lab Results  Component Value Date   CHOL 240 (H) 01/25/2020   HDL 89 01/25/2020   LDLCALC 140 (H) 01/25/2020   TRIG 65 01/25/2020     GEN- The patient is well appearing, alert and oriented x 3 today.   Head- normocephalic, atraumatic Eyes-  Sclera clear, conjunctiva pink Ears- hearing intact Oropharynx- clear Neck- supple, no JVP Lymph- no cervical lymphadenopathy Lungs- Clear to ausculation bilaterally, normal work of breathing Heart  regular rate and rhythm, no murmurs, rubs or gallops, PMI not laterally displaced GI- soft, NT, ND, + BS Extremities- no clubbing, cyanosis, or edema MS- no significant deformity or atrophy Skin- no rash or lesion Psych- euthymic mood, full affect Neuro- strength and sensation are intact  EKG-  NSR  at 62 bpm, pr int 180 ms, qrs int 84 ms, qtc 456 ms   TEE- 1. Left ventricular ejection fraction, by estimation, is 60 to 65%. The  left ventricle has normal function.  2. Right ventricular systolic function is normal. The right ventricular  size is normal. There is normal pulmonary artery systolic pressure.  3. Left atrial size was moderately dilated. No left atrial/left atrial  appendage thrombus was detected. The LAA emptying velocity was 51 cm/s.  4. Right atrial size was mildly dilated.  5. The mitral valve is mildly degenerative. Moderate mitral valve  regurgitation.  6. Tricuspid valve regurgitation is mild to moderate.  7. The aortic valve is normal in structure. Aortic valve regurgitation is  not visualized. No aortic stenosis is present.  8. Indeterminate bubble study due to suboptimal injection. No definite  atrial level shunt.   Conclusion(s)/Recommendation(s): No LA/LAA thrombus identified. Successful  cardioversion performed with restoration of normal sinus rhythm.   Assessment and Plan: 1. Paroxysmal afib S/p ablation 2017 and most recently 06/20/20 after return of aflutter after CV in November 2021 So far has not noted any afib Staying in SR  Continue  flecainide 100 mg bid  Continue  Metoprolol 50 mg daily   Has 30 mg Cardizem as needed every 4-6 hours for afib Continue eliquis 5 mg bid  for a CHA2DS2VASc score of 4, reminded not to stop anticoagulation  for any elective procedure for the 3 month recovery period   F/u with Dr. Audie Box as recall F/u Dr. Rayann Heman in June per recall   Geroge Baseman. Lealer [place], [facility] [address] [place], [place] [address] [phone]

## 2020-08-14 DIAGNOSIS — F908 Attention-deficit hyperactivity disorder, other type: Secondary | ICD-10-CM | POA: Diagnosis not present

## 2020-08-14 DIAGNOSIS — F3181 Bipolar II disorder: Secondary | ICD-10-CM | POA: Diagnosis not present

## 2020-08-15 DIAGNOSIS — G4733 Obstructive sleep apnea (adult) (pediatric): Secondary | ICD-10-CM | POA: Diagnosis not present

## 2020-08-21 DIAGNOSIS — F908 Attention-deficit hyperactivity disorder, other type: Secondary | ICD-10-CM | POA: Diagnosis not present

## 2020-08-21 DIAGNOSIS — F3181 Bipolar II disorder: Secondary | ICD-10-CM | POA: Diagnosis not present

## 2020-09-04 DIAGNOSIS — F3181 Bipolar II disorder: Secondary | ICD-10-CM | POA: Diagnosis not present

## 2020-09-04 DIAGNOSIS — F908 Attention-deficit hyperactivity disorder, other type: Secondary | ICD-10-CM | POA: Diagnosis not present

## 2020-09-11 DIAGNOSIS — F3181 Bipolar II disorder: Secondary | ICD-10-CM | POA: Diagnosis not present

## 2020-09-11 DIAGNOSIS — F908 Attention-deficit hyperactivity disorder, other type: Secondary | ICD-10-CM | POA: Diagnosis not present

## 2020-09-18 ENCOUNTER — Encounter: Payer: Self-pay | Admitting: Internal Medicine

## 2020-09-18 ENCOUNTER — Ambulatory Visit: Payer: Medicare Other | Admitting: Internal Medicine

## 2020-09-18 ENCOUNTER — Other Ambulatory Visit: Payer: Self-pay

## 2020-09-18 VITALS — BP 152/64 | HR 73 | Ht 65.5 in | Wt 161.2 lb

## 2020-09-18 DIAGNOSIS — Z9989 Dependence on other enabling machines and devices: Secondary | ICD-10-CM | POA: Diagnosis not present

## 2020-09-18 DIAGNOSIS — G4733 Obstructive sleep apnea (adult) (pediatric): Secondary | ICD-10-CM

## 2020-09-18 DIAGNOSIS — F908 Attention-deficit hyperactivity disorder, other type: Secondary | ICD-10-CM | POA: Diagnosis not present

## 2020-09-18 DIAGNOSIS — I480 Paroxysmal atrial fibrillation: Secondary | ICD-10-CM | POA: Diagnosis not present

## 2020-09-18 DIAGNOSIS — I4892 Unspecified atrial flutter: Secondary | ICD-10-CM | POA: Diagnosis not present

## 2020-09-18 DIAGNOSIS — F3181 Bipolar II disorder: Secondary | ICD-10-CM | POA: Diagnosis not present

## 2020-09-18 NOTE — Patient Instructions (Addendum)
Medication Instructions:  Your physician recommends that you continue on your current medications as directed. Please refer to the Current Medication list given to you today.  Labwork: None ordered.  Testing/Procedures: None ordered.  Follow-Up: Your physician wants you to follow-up in: 01/24/21 at 1:45 am 12 months with Thompson Grayer, MD    Any Other Special Instructions Will Be Listed Below (If Applicable).  If you need a refill on your cardiac medications before your next appointment, please call your pharmacy.

## 2020-09-18 NOTE — Progress Notes (Signed)
PCP: [name], MD Primary Cardiologist: Dr Delena Bali [name] is a 70 y.o. female who presents today for routine electrophysiology followup.  Since his recent afib ablation, the patient reports doing reasonably well.  she denies procedure related complications and is pleased with the results of the procedure.  Today, she denies symptoms of palpitations, chest pain, shortness of breath,  lower extremity edema, dizziness, presyncope, or syncope.  The patient is otherwise without complaint today.   Past Medical History:  Diagnosis Date   ADD (attention deficit disorder)    Arrhythmia    Cancer ([place]) 2001   left breast, treated with lumpectomy, radiation, and chemotherapy   Depression    Mitral valve prolapse    Myocarditis ([place])    at age 14   Paroxysmal atrial fibrillation ([place])    Sleep apnea    uses CPAP followed by Dr Maxwell Caul   Stroke [facility]) 2015   Past Surgical History:  Procedure Laterality Date   ATRIAL FIBRILLATION ABLATION N/A 06/20/2020   Procedure: ATRIAL FIBRILLATION ABLATION;  Surgeon: Ashley Grayer, MD;  Location: [facility];  Service: Cardiovascular;  Laterality: N/A;   BREAST LUMPECTOMY  2001   malignancy removed   BREAST LUMPECTOMY WITH RADIOACTIVE SEED LOCALIZATION Right 08/09/2013   benign   BREAST SURGERY  1993   left cyst removed   BUBBLE STUDY  02/03/2020   Procedure: BUBBLE STUDY;  Surgeon: [name], MD;  Location: [facility];  Service: Cardiovascular;;   CARDIOVERSION N/A 02/03/2020   Procedure: CARDIOVERSION;  Surgeon: [name], MD;  Location: [facility];  Service: Cardiovascular;  Laterality: N/A;   CATARACT EXTRACTION     ELECTROPHYSIOLOGIC STUDY N/A 01/09/2016   Procedure: Atrial Fibrillation Ablation;  Surgeon: Ashley Grayer, MD;  Location: [facility];  Service: Cardiovascular;  Laterality: N/A;   OOPHORECTOMY Left    right knee tibia surgery     2006   TEE WITHOUT CARDIOVERSION N/A 02/03/2020   Procedure:  TRANSESOPHAGEAL ECHOCARDIOGRAM (TEE);  Surgeon: [name], MD;  Location: [facility] ENDOSCOPY;  Service: Cardiovascular;  Laterality: N/A;    ROS- all systems are personally reviewed and negatives except as per HPI above  Current Outpatient Medications  Medication Sig Dispense Refill   apixaban (ELIQUIS) 5 MG TABS tablet TAKE 1 TABLET(5 MG) BY MOUTH TWICE DAILY 180 tablet 3   cetirizine (ZYRTEC) 10 MG tablet Take 10 mg by mouth daily as needed for allergies.     diltiazem (CARDIZEM) 30 MG tablet TAKE 1 TABLET BY MOUTH EVERY 4 HOURS AS NEEDED FOR HEART RATE GREATER THAN 100 45 tablet 1   flecainide (TAMBOCOR) 100 MG tablet Take 1 tablet (100 mg total) by mouth 2 (two) times daily. 180 tablet 3   fluticasone (CUTIVATE) 0.05 % cream Apply 1 application topically daily as needed (itching).      ketoconazole (NIZORAL) 2 % cream Apply 1 application topically daily as needed (Skin irritation).      lamoTRIgine (LAMICTAL) 200 MG tablet Take 200 mg by mouth at bedtime.   5   metoprolol succinate (TOPROL-XL) 50 MG 24 hr tablet Take 1 tablet (50 mg total) by mouth daily. 90 tablet 1   modafinil (PROVIGIL) 100 MG tablet Take 150 mg by mouth daily before breakfast.     Omega-3 Fatty Acids (FISH OIL PO) Take 2,200 mg by mouth at bedtime.     vortioxetine HBr (TRINTELLIX) 20 MG TABS tablet Take 20 mg by mouth in the morning.  rosuvastatin (CRESTOR) 20 MG tablet Take 1 tablet (20 mg total) by mouth daily. 90 tablet 3   No current facility-administered medications for this visit.    Physical Exam: Vitals:   09/18/20 1037  BP: (!) 152/64  Pulse: 73  SpO2: 96%  Weight: 161 lb 3.2 oz (73.1 kg)  Height: 5' 5.5" (1.664 m)    GEN- The patient is well appearing, alert and oriented x 3 today.   Head- normocephalic, atraumatic Eyes-  Sclera clear, conjunctiva pink Ears- hearing intact Oropharynx- clear Lungs- Clear to ausculation bilaterally, normal work of breathing Heart- Regular rate and rhythm,  no murmurs, rubs or gallops, PMI not laterally displaced GI- soft, NT, ND, + BS Extremities- no clubbing, cyanosis, or edema  EKG tracing ordered today is personally reviewed and shows sinus rhythm  Echo 06/22/20- EF 60%, m ild to moderate MR Assessment and Plan:  1. Persistent atrial fibrillation/ atypical atrial flutter Doing well s/p repeat ablation 3/22 chads2vasc score is 4. She is on eliquis She does not wish to stop flecainide at this time No changes today  2. MR Mild to moderate by recent echo Reviewed with her today  3. OSA Compliant with CPAP We discussed today  4. HL Continue crestor 20mg  daily  5. HTN Elevated today She does not wish to change toprol dose or add losartan We discussed lifestyle change today  Return to see me in 4 months  Ashley Grayer MD, [facility] 09/18/2020 10:39 AM

## 2020-09-25 DIAGNOSIS — F3181 Bipolar II disorder: Secondary | ICD-10-CM | POA: Diagnosis not present

## 2020-09-25 DIAGNOSIS — F908 Attention-deficit hyperactivity disorder, other type: Secondary | ICD-10-CM | POA: Diagnosis not present

## 2020-10-04 ENCOUNTER — Other Ambulatory Visit (HOSPITAL_COMMUNITY): Payer: Self-pay | Admitting: Nurse Practitioner

## 2020-10-04 NOTE — Telephone Encounter (Signed)
Prescription refill request for Eliquis received. Indication:atrial fib Last office visit:6/22 Scr:0.6 Age: 70 Weight:73.1 kg  Prescription refilled

## 2020-10-08 ENCOUNTER — Other Ambulatory Visit (HOSPITAL_COMMUNITY): Payer: Self-pay | Admitting: Nurse Practitioner

## 2020-10-09 ENCOUNTER — Other Ambulatory Visit (HOSPITAL_COMMUNITY): Payer: Self-pay

## 2020-10-09 DIAGNOSIS — F3181 Bipolar II disorder: Secondary | ICD-10-CM | POA: Diagnosis not present

## 2020-10-09 DIAGNOSIS — F908 Attention-deficit hyperactivity disorder, other type: Secondary | ICD-10-CM | POA: Diagnosis not present

## 2020-10-09 MED ORDER — METOPROLOL SUCCINATE ER 50 MG PO TB24: 50.0000 mg | ORAL_TABLET | Freq: Every day | ORAL | 3 refills | Status: DC

## 2020-10-16 DIAGNOSIS — F908 Attention-deficit hyperactivity disorder, other type: Secondary | ICD-10-CM | POA: Diagnosis not present

## 2020-10-16 DIAGNOSIS — F3181 Bipolar II disorder: Secondary | ICD-10-CM | POA: Diagnosis not present

## 2020-11-06 DIAGNOSIS — F908 Attention-deficit hyperactivity disorder, other type: Secondary | ICD-10-CM | POA: Diagnosis not present

## 2020-11-06 DIAGNOSIS — F3181 Bipolar II disorder: Secondary | ICD-10-CM | POA: Diagnosis not present

## 2020-11-13 DIAGNOSIS — F3181 Bipolar II disorder: Secondary | ICD-10-CM | POA: Diagnosis not present

## 2020-11-13 DIAGNOSIS — F908 Attention-deficit hyperactivity disorder, other type: Secondary | ICD-10-CM | POA: Diagnosis not present

## 2020-11-20 DIAGNOSIS — F908 Attention-deficit hyperactivity disorder, other type: Secondary | ICD-10-CM | POA: Diagnosis not present

## 2020-11-20 DIAGNOSIS — F3181 Bipolar II disorder: Secondary | ICD-10-CM | POA: Diagnosis not present

## 2020-11-24 DIAGNOSIS — G4733 Obstructive sleep apnea (adult) (pediatric): Secondary | ICD-10-CM | POA: Diagnosis not present

## 2020-11-27 DIAGNOSIS — F908 Attention-deficit hyperactivity disorder, other type: Secondary | ICD-10-CM | POA: Diagnosis not present

## 2020-11-27 DIAGNOSIS — F3181 Bipolar II disorder: Secondary | ICD-10-CM | POA: Diagnosis not present

## 2020-12-06 DIAGNOSIS — F339 Major depressive disorder, recurrent, unspecified: Secondary | ICD-10-CM | POA: Diagnosis not present

## 2020-12-06 DIAGNOSIS — G3184 Mild cognitive impairment, so stated: Secondary | ICD-10-CM | POA: Diagnosis not present

## 2020-12-06 DIAGNOSIS — G4730 Sleep apnea, unspecified: Secondary | ICD-10-CM | POA: Diagnosis not present

## 2021-01-24 ENCOUNTER — Ambulatory Visit: Payer: Medicare Other | Admitting: Internal Medicine

## 2021-01-24 DIAGNOSIS — I4819 Other persistent atrial fibrillation: Secondary | ICD-10-CM

## 2021-01-24 DIAGNOSIS — E782 Mixed hyperlipidemia: Secondary | ICD-10-CM

## 2021-01-24 DIAGNOSIS — I10 Essential (primary) hypertension: Secondary | ICD-10-CM

## 2021-01-24 DIAGNOSIS — I059 Rheumatic mitral valve disease, unspecified: Secondary | ICD-10-CM

## 2021-01-24 DIAGNOSIS — G4733 Obstructive sleep apnea (adult) (pediatric): Secondary | ICD-10-CM

## 2021-01-24 DIAGNOSIS — I484 Atypical atrial flutter: Secondary | ICD-10-CM

## 2021-01-25 ENCOUNTER — Other Ambulatory Visit: Payer: Self-pay | Admitting: Cardiovascular Disease

## 2021-01-25 DIAGNOSIS — F908 Attention-deficit hyperactivity disorder, other type: Secondary | ICD-10-CM | POA: Diagnosis not present

## 2021-01-25 DIAGNOSIS — F3181 Bipolar II disorder: Secondary | ICD-10-CM | POA: Diagnosis not present

## 2021-02-06 DIAGNOSIS — F3181 Bipolar II disorder: Secondary | ICD-10-CM | POA: Diagnosis not present

## 2021-02-06 DIAGNOSIS — F908 Attention-deficit hyperactivity disorder, other type: Secondary | ICD-10-CM | POA: Diagnosis not present

## 2021-02-19 DIAGNOSIS — F3181 Bipolar II disorder: Secondary | ICD-10-CM | POA: Diagnosis not present

## 2021-02-19 DIAGNOSIS — F908 Attention-deficit hyperactivity disorder, other type: Secondary | ICD-10-CM | POA: Diagnosis not present

## 2021-02-21 ENCOUNTER — Ambulatory Visit: Payer: Medicare Other | Admitting: Internal Medicine

## 2021-02-28 ENCOUNTER — Other Ambulatory Visit: Payer: Self-pay

## 2021-02-28 ENCOUNTER — Ambulatory Visit: Payer: Medicare Other | Admitting: Internal Medicine

## 2021-02-28 VITALS — BP 166/78 | HR 63 | Ht 65.5 in | Wt 168.2 lb

## 2021-02-28 DIAGNOSIS — G4733 Obstructive sleep apnea (adult) (pediatric): Secondary | ICD-10-CM

## 2021-02-28 DIAGNOSIS — I10 Essential (primary) hypertension: Secondary | ICD-10-CM

## 2021-02-28 DIAGNOSIS — I4819 Other persistent atrial fibrillation: Secondary | ICD-10-CM | POA: Diagnosis not present

## 2021-02-28 DIAGNOSIS — I484 Atypical atrial flutter: Secondary | ICD-10-CM

## 2021-02-28 DIAGNOSIS — I340 Nonrheumatic mitral (valve) insufficiency: Secondary | ICD-10-CM | POA: Diagnosis not present

## 2021-02-28 DIAGNOSIS — E782 Mixed hyperlipidemia: Secondary | ICD-10-CM

## 2021-02-28 MED ORDER — LOSARTAN POTASSIUM 25 MG PO TABS: 25.0000 mg | ORAL_TABLET | Freq: Every day | ORAL | 3 refills | Status: DC

## 2021-02-28 MED ORDER — METOPROLOL SUCCINATE ER 25 MG PO TB24: 25.0000 mg | ORAL_TABLET | Freq: Every day | ORAL | 3 refills | Status: DC

## 2021-02-28 NOTE — Progress Notes (Signed)
PCP: [name], MD Primary Cardiologist: Dr Marisue Ivan Primary EP: Dr Marvia Pickles Ashley White is a 70 y.o. female who presents today for routine electrophysiology followup.  Since last being seen in our clinic, the patient reports doing very well.  Today, she denies symptoms of palpitations, chest pain, shortness of breath,  lower extremity edema, dizziness, presyncope, or syncope.  The patient is otherwise without complaint today.   Past Medical History:  Diagnosis Date   ADD (attention deficit disorder)    Arrhythmia    Cancer ([place]) 2001   left breast, treated with lumpectomy, radiation, and chemotherapy   Depression    Mitral valve prolapse    Myocarditis ([place])    at age 59   Paroxysmal atrial fibrillation ([place])    Sleep apnea    uses CPAP followed by Dr Maxwell Caul   Stroke [facility]) 2015   Past Surgical History:  Procedure Laterality Date   ATRIAL FIBRILLATION ABLATION N/A 06/20/2020   Procedure: ATRIAL FIBRILLATION ABLATION;  Surgeon: Thompson Grayer, MD;  Location: [place] [facility];  Service: Cardiovascular;  Laterality: N/A;   BREAST LUMPECTOMY  2001   malignancy removed   BREAST LUMPECTOMY WITH RADIOACTIVE SEED LOCALIZATION Right 08/09/2013   benign   BREAST SURGERY  1993   left cyst removed   BUBBLE STUDY  02/03/2020   Procedure: BUBBLE STUDY;  Surgeon: [name], MD;  Location: [facility];  Service: Cardiovascular;;   CARDIOVERSION N/A 02/03/2020   Procedure: CARDIOVERSION;  Surgeon: [name], MD;  Location: [place];  Service: Cardiovascular;  Laterality: N/A;   CATARACT EXTRACTION     ELECTROPHYSIOLOGIC STUDY N/A 01/09/2016   Procedure: Atrial Fibrillation Ablation;  Surgeon: Thompson Grayer, MD;  Location: [place];  Service: Cardiovascular;  Laterality: N/A;   OOPHORECTOMY Left    right knee tibia surgery     2006   TEE WITHOUT CARDIOVERSION N/A 02/03/2020   Procedure: TRANSESOPHAGEAL ECHOCARDIOGRAM (TEE);  Surgeon: [name], MD;   Location: [facility] ENDOSCOPY;  Service: Cardiovascular;  Laterality: N/A;    ROS- all systems are reviewed and negatives except as per HPI above  Current Outpatient Medications  Medication Sig Dispense Refill   apixaban (ELIQUIS) 5 MG TABS tablet TAKE 1 TABLET(5 MG) BY MOUTH TWICE DAILY 180 tablet 1   cetirizine (ZYRTEC) 10 MG tablet Take 10 mg by mouth daily as needed for allergies.     diltiazem (CARDIZEM) 30 MG tablet TAKE 1 TABLET BY MOUTH EVERY 4 HOURS AS NEEDED FOR HEART RATE GREATER THAN 100 45 tablet 1   flecainide (TAMBOCOR) 100 MG tablet Take 1 tablet (100 mg total) by mouth 2 (two) times daily. 180 tablet 3   fluticasone (CUTIVATE) 0.05 % cream Apply 1 application topically daily as needed (itching).      ketoconazole (NIZORAL) 2 % cream Apply 1 application topically daily as needed (Skin irritation).      lamoTRIgine (LAMICTAL) 200 MG tablet Take 200 mg by mouth at bedtime.   5   metoprolol succinate (TOPROL-XL) 50 MG 24 hr tablet Take 1 tablet (50 mg total) by mouth daily. 90 tablet 3   modafinil (PROVIGIL) 100 MG tablet Take 150 mg by mouth daily before breakfast.     Omega-3 Fatty Acids (FISH OIL PO) Take 2,200 mg by mouth at bedtime.     rosuvastatin (CRESTOR) 20 MG tablet TAKE 1 TABLET(20 MG) BY MOUTH DAILY 90 tablet 3   vortioxetine HBr (TRINTELLIX) 20 MG TABS tablet Take  20 mg by mouth in the morning.     No current facility-administered medications for this visit.    Physical Exam: Vitals:   02/28/21 1459  BP: (!) 166/78  Pulse: 63  SpO2: 98%  Weight: 168 lb 3.2 oz (76.3 kg)  Height: 5' 5.5" (1.664 m)    GEN- The patient is well appearing, alert and oriented x 3 today.   Head- normocephalic, atraumatic Eyes-  Sclera clear, conjunctiva pink Ears- hearing intact Oropharynx- clear Lungs- Clear to ausculation bilaterally, normal work of breathing Heart- Regular rate and rhythm, no murmurs, rubs or gallops, PMI not laterally displaced GI- soft, NT, ND, +  BS Extremities- no clubbing, cyanosis, or edema  Wt Readings from Last 3 Encounters:  02/28/21 168 lb 3.2 oz (76.3 kg)  09/18/20 161 lb 3.2 oz (73.1 kg)  07/19/20 164 lb 12.8 oz (74.8 kg)    EKG tracing ordered today is personally reviewed and shows sinus  Assessment and Plan:  Persistent afib/ atypical atrial flutter Well controlled post ablation 3/22 Chads2vasc score is 4.  She is on eliquis I suspect that she could stop flecainide.  She is clear that she does not wish to do so Reduce toprol to 25mg  daily  2. OSA Compliant with CPAP  3. HL Continue crestor 20mg  daily  4. MR Mild to moderate by prior echo Repeat echo on return to AF clinic  5. HTN Elevated today Add losartan 25mg  daily Bmet today Reduce toprol to 25mg  daily due to fatigue and depression   Risks, benefits and potential toxicities for medications prescribed and/or refilled reviewed with patient today.   Return to AF clinic in 6 months  Thompson Grayer MD, [facility] 02/28/2021 3:23 PM

## 2021-02-28 NOTE — Patient Instructions (Addendum)
Medication Instructions:  Reduce Metoprolol Succinate to 25 mg daily Start Losartan 25 mg daily Your physician recommends that you continue on your current medications as directed. Please refer to the Current Medication list given to you today. *If you need a refill on your cardiac medications before your next appointment, please call your pharmacy*  Lab Work: BMP If you have labs (blood work) drawn today and your tests are completely normal, you will receive your results only by: [place] (if you have MyChart) OR A paper copy in the mail If you have any lab test that is abnormal or we need to change your treatment, we will call you to review the results.  Testing/Procedures: None.  Follow-Up: At [facility], you and your health needs are our priority.  As part of our continuing mission to provide you with exceptional heart care, we have created designated Provider Care Teams.  These Care Teams include your primary Cardiologist (physician) and Advanced Practice Providers (APPs -  Physician Assistants and Nurse Practitioners) who all work together to provide you with the care you need, when you need it.  Your physician wants you to follow-up in: 6 months in the [facility]. They will contact you to schedule.    You will receive a reminder letter in the mail two months in advance. If you don't receive a letter, please call our office to schedule the follow-up appointment.  We recommend signing up for the patient portal called "MyChart".  Sign up information is provided on this After Visit Summary.  MyChart is used to connect with patients for Virtual Visits (Telemedicine).  Patients are able to view lab/test results, encounter notes, upcoming appointments, etc.  Non-urgent messages can be sent to your provider as well.   To learn more about what you can do with MyChart, go to [email or web address].    Any Other Special Instructions Will Be Listed Below (If Applicable).

## 2021-03-01 LAB — BASIC METABOLIC PANEL
BUN/Creatinine Ratio: 24 (ref 12–28)
BUN: 14 mg/dL (ref 8–27)
CO2: 25 mmol/L (ref 20–29)
Calcium: 9.8 mg/dL (ref 8.7–10.3)
Chloride: 102 mmol/L (ref 96–106)
Creatinine, Ser: 0.59 mg/dL (ref 0.57–1.00)
Glucose: 103 mg/dL — ABNORMAL HIGH (ref 70–99)
Potassium: 4.7 mmol/L (ref 3.5–5.2)
Sodium: 142 mmol/L (ref 134–144)
eGFR: 97 mL/min/{1.73_m2} (ref >59)

## 2021-03-05 DIAGNOSIS — F908 Attention-deficit hyperactivity disorder, other type: Secondary | ICD-10-CM | POA: Diagnosis not present

## 2021-03-05 DIAGNOSIS — F3181 Bipolar II disorder: Secondary | ICD-10-CM | POA: Diagnosis not present

## 2021-03-19 DIAGNOSIS — F3181 Bipolar II disorder: Secondary | ICD-10-CM | POA: Diagnosis not present

## 2021-03-19 DIAGNOSIS — F908 Attention-deficit hyperactivity disorder, other type: Secondary | ICD-10-CM | POA: Diagnosis not present

## 2021-03-21 ENCOUNTER — Other Ambulatory Visit: Payer: Self-pay | Admitting: Cardiovascular Disease

## 2021-04-09 DIAGNOSIS — F3181 Bipolar II disorder: Secondary | ICD-10-CM | POA: Diagnosis not present

## 2021-04-09 DIAGNOSIS — F908 Attention-deficit hyperactivity disorder, other type: Secondary | ICD-10-CM | POA: Diagnosis not present

## 2021-04-17 DIAGNOSIS — F908 Attention-deficit hyperactivity disorder, other type: Secondary | ICD-10-CM | POA: Diagnosis not present

## 2021-04-17 DIAGNOSIS — F3181 Bipolar II disorder: Secondary | ICD-10-CM | POA: Diagnosis not present

## 2021-04-23 DIAGNOSIS — F908 Attention-deficit hyperactivity disorder, other type: Secondary | ICD-10-CM | POA: Diagnosis not present

## 2021-04-23 DIAGNOSIS — F3181 Bipolar II disorder: Secondary | ICD-10-CM | POA: Diagnosis not present

## 2021-05-02 DIAGNOSIS — G4730 Sleep apnea, unspecified: Secondary | ICD-10-CM | POA: Diagnosis not present

## 2021-05-02 DIAGNOSIS — F329 Major depressive disorder, single episode, unspecified: Secondary | ICD-10-CM | POA: Diagnosis not present

## 2021-05-07 DIAGNOSIS — F908 Attention-deficit hyperactivity disorder, other type: Secondary | ICD-10-CM | POA: Diagnosis not present

## 2021-05-07 DIAGNOSIS — F3181 Bipolar II disorder: Secondary | ICD-10-CM | POA: Diagnosis not present

## 2021-05-21 DIAGNOSIS — F3181 Bipolar II disorder: Secondary | ICD-10-CM | POA: Diagnosis not present

## 2021-05-21 DIAGNOSIS — F908 Attention-deficit hyperactivity disorder, other type: Secondary | ICD-10-CM | POA: Diagnosis not present

## 2021-05-22 DIAGNOSIS — D6869 Other thrombophilia: Secondary | ICD-10-CM | POA: Diagnosis not present

## 2021-05-22 DIAGNOSIS — M8588 Other specified disorders of bone density and structure, other site: Secondary | ICD-10-CM | POA: Diagnosis not present

## 2021-05-22 DIAGNOSIS — I480 Paroxysmal atrial fibrillation: Secondary | ICD-10-CM | POA: Diagnosis not present

## 2021-05-22 DIAGNOSIS — Z0000 Encounter for general adult medical examination without abnormal findings: Secondary | ICD-10-CM | POA: Diagnosis not present

## 2021-05-30 ENCOUNTER — Encounter: Payer: Self-pay | Admitting: Cardiovascular Disease

## 2021-05-30 ENCOUNTER — Telehealth: Payer: Self-pay | Admitting: Cardiovascular Disease

## 2021-05-30 DIAGNOSIS — U071 COVID-19: Secondary | ICD-10-CM | POA: Diagnosis not present

## 2021-05-30 DIAGNOSIS — R030 Elevated blood-pressure reading, without diagnosis of hypertension: Secondary | ICD-10-CM | POA: Diagnosis not present

## 2021-05-30 DIAGNOSIS — R040 Epistaxis: Secondary | ICD-10-CM | POA: Diagnosis not present

## 2021-05-30 NOTE — Telephone Encounter (Signed)
Left message to call back  

## 2021-05-30 NOTE — Telephone Encounter (Signed)
Please see phone encounter.

## 2021-05-30 NOTE — Telephone Encounter (Signed)
?  STAT if HR is under 50 or over 120 ?(normal HR is 60-100 beats per minute) ? ?What is your heart rate? 70-80 bpm ? ?Do you have a log of your heart rate readings (document readings)?  ? ?Do you have any other symptoms? Ashley White called and she is covid+. She wanted to ask Dr. Audie Box for recommendation since she know that covid can have an effect in the heart. She said this morning her HR elevated from the 70s to 80s, she said, her HR stay on the 80s for couple of minutes and now back to 70s again. She is going to have a virtual appt with her pcp at  2:45 pm today.  ?

## 2021-05-30 NOTE — Telephone Encounter (Signed)
-  Pt called stating she was recently diagnosed with COVID . ?-Pt state she was concerned as her normal resting HR is in the 50's but it's since increased to 70-80's. ?-Pt denies any other symptoms. ?-Nurse informed pt that it's not uncommon to see an increase in HR and BP with viral infections but to stay hydrated and continue to monitor symptoms.  ?-Pt verbalized understanding.  ?

## 2021-06-04 DIAGNOSIS — F3181 Bipolar II disorder: Secondary | ICD-10-CM | POA: Diagnosis not present

## 2021-06-04 DIAGNOSIS — F908 Attention-deficit hyperactivity disorder, other type: Secondary | ICD-10-CM | POA: Diagnosis not present

## 2021-06-13 ENCOUNTER — Other Ambulatory Visit (HOSPITAL_COMMUNITY): Payer: Self-pay | Admitting: Nurse Practitioner

## 2021-06-13 NOTE — Telephone Encounter (Signed)
Prescription refill request for Eliquis received. ?Indication: Afib  ?Last office visit: 02/28/21 ([facility]) ?Scr: 0.59 (02/28/21) ?Age: 71 ?Weight: 76.3kg ? ?Appropriate dose and refill sent to requested pharmacy  ?

## 2021-06-18 DIAGNOSIS — F3181 Bipolar II disorder: Secondary | ICD-10-CM | POA: Diagnosis not present

## 2021-06-18 DIAGNOSIS — F908 Attention-deficit hyperactivity disorder, other type: Secondary | ICD-10-CM | POA: Diagnosis not present

## 2021-06-26 ENCOUNTER — Other Ambulatory Visit: Payer: Self-pay | Admitting: Gastroenterology

## 2021-06-26 DIAGNOSIS — Z8371 Family history of colonic polyps: Secondary | ICD-10-CM

## 2021-07-02 DIAGNOSIS — F908 Attention-deficit hyperactivity disorder, other type: Secondary | ICD-10-CM | POA: Diagnosis not present

## 2021-07-02 DIAGNOSIS — F3181 Bipolar II disorder: Secondary | ICD-10-CM | POA: Diagnosis not present

## 2021-07-03 DIAGNOSIS — Z1283 Encounter for screening for malignant neoplasm of skin: Secondary | ICD-10-CM | POA: Diagnosis not present

## 2021-07-03 DIAGNOSIS — L821 Other seborrheic keratosis: Secondary | ICD-10-CM | POA: Diagnosis not present

## 2021-07-03 DIAGNOSIS — L400 Psoriasis vulgaris: Secondary | ICD-10-CM | POA: Diagnosis not present

## 2021-07-03 DIAGNOSIS — B0089 Other herpesviral infection: Secondary | ICD-10-CM | POA: Diagnosis not present

## 2021-07-16 DIAGNOSIS — F3181 Bipolar II disorder: Secondary | ICD-10-CM | POA: Diagnosis not present

## 2021-07-16 DIAGNOSIS — F908 Attention-deficit hyperactivity disorder, other type: Secondary | ICD-10-CM | POA: Diagnosis not present

## 2021-07-18 DIAGNOSIS — L239 Allergic contact dermatitis, unspecified cause: Secondary | ICD-10-CM | POA: Diagnosis not present

## 2021-07-25 DIAGNOSIS — L308 Other specified dermatitis: Secondary | ICD-10-CM | POA: Diagnosis not present

## 2021-08-30 DIAGNOSIS — H04123 Dry eye syndrome of bilateral lacrimal glands: Secondary | ICD-10-CM | POA: Diagnosis not present

## 2021-08-30 DIAGNOSIS — H31092 Other chorioretinal scars, left eye: Secondary | ICD-10-CM | POA: Diagnosis not present

## 2021-08-30 DIAGNOSIS — H43813 Vitreous degeneration, bilateral: Secondary | ICD-10-CM | POA: Diagnosis not present

## 2021-08-30 DIAGNOSIS — Z961 Presence of intraocular lens: Secondary | ICD-10-CM | POA: Diagnosis not present

## 2021-09-05 ENCOUNTER — Ambulatory Visit
Admission: RE | Admit: 2021-09-05 | Discharge: 2021-09-05 | Disposition: A | Discharge status: Home / Self Care | Payer: Medicare Other | Source: Ambulatory Visit | Attending: Gastroenterology | Admitting: Gastroenterology

## 2021-09-05 DIAGNOSIS — Z8371 Family history of colonic polyps: Secondary | ICD-10-CM

## 2021-09-10 DIAGNOSIS — F908 Attention-deficit hyperactivity disorder, other type: Secondary | ICD-10-CM | POA: Diagnosis not present

## 2021-09-10 DIAGNOSIS — F3181 Bipolar II disorder: Secondary | ICD-10-CM | POA: Diagnosis not present

## 2021-09-24 DIAGNOSIS — F3181 Bipolar II disorder: Secondary | ICD-10-CM | POA: Diagnosis not present

## 2021-09-24 DIAGNOSIS — F908 Attention-deficit hyperactivity disorder, other type: Secondary | ICD-10-CM | POA: Diagnosis not present

## 2021-10-05 DIAGNOSIS — G4733 Obstructive sleep apnea (adult) (pediatric): Secondary | ICD-10-CM | POA: Diagnosis not present

## 2021-10-08 DIAGNOSIS — F3181 Bipolar II disorder: Secondary | ICD-10-CM | POA: Diagnosis not present

## 2021-10-08 DIAGNOSIS — F908 Attention-deficit hyperactivity disorder, other type: Secondary | ICD-10-CM | POA: Diagnosis not present

## 2021-10-15 DIAGNOSIS — G4730 Sleep apnea, unspecified: Secondary | ICD-10-CM | POA: Diagnosis not present

## 2021-10-15 DIAGNOSIS — F329 Major depressive disorder, single episode, unspecified: Secondary | ICD-10-CM | POA: Diagnosis not present

## 2021-10-23 ENCOUNTER — Other Ambulatory Visit (HOSPITAL_COMMUNITY): Payer: Self-pay | Admitting: Nurse Practitioner

## 2021-10-29 DIAGNOSIS — F908 Attention-deficit hyperactivity disorder, other type: Secondary | ICD-10-CM | POA: Diagnosis not present

## 2021-10-29 DIAGNOSIS — F3181 Bipolar II disorder: Secondary | ICD-10-CM | POA: Diagnosis not present

## 2021-11-05 DIAGNOSIS — G4733 Obstructive sleep apnea (adult) (pediatric): Secondary | ICD-10-CM | POA: Diagnosis not present

## 2021-11-12 DIAGNOSIS — F3181 Bipolar II disorder: Secondary | ICD-10-CM | POA: Diagnosis not present

## 2021-11-12 DIAGNOSIS — F908 Attention-deficit hyperactivity disorder, other type: Secondary | ICD-10-CM | POA: Diagnosis not present

## 2021-11-26 ENCOUNTER — Other Ambulatory Visit (HOSPITAL_COMMUNITY): Payer: Self-pay | Admitting: Internal Medicine

## 2021-11-26 DIAGNOSIS — F908 Attention-deficit hyperactivity disorder, other type: Secondary | ICD-10-CM | POA: Diagnosis not present

## 2021-11-26 DIAGNOSIS — F3181 Bipolar II disorder: Secondary | ICD-10-CM | POA: Diagnosis not present

## 2021-11-26 DIAGNOSIS — I480 Paroxysmal atrial fibrillation: Secondary | ICD-10-CM

## 2021-11-26 NOTE — Telephone Encounter (Signed)
Eliquis '5mg'$  refill request received. Patient is 71 years old, weight-76.3kg, Crea-0.59 on 02/28/2021, Diagnosis-Afib, and last seen by Dr. Rayann Heman on 02/28/2021. Dose is appropriate based on dosing criteria. Will send in refill to requested pharmacy.

## 2021-11-30 DIAGNOSIS — M25561 Pain in right knee: Secondary | ICD-10-CM | POA: Diagnosis not present

## 2021-11-30 DIAGNOSIS — R269 Unspecified abnormalities of gait and mobility: Secondary | ICD-10-CM | POA: Diagnosis not present

## 2021-12-06 DIAGNOSIS — G4733 Obstructive sleep apnea (adult) (pediatric): Secondary | ICD-10-CM | POA: Diagnosis not present

## 2021-12-24 DIAGNOSIS — F908 Attention-deficit hyperactivity disorder, other type: Secondary | ICD-10-CM | POA: Diagnosis not present

## 2021-12-24 DIAGNOSIS — F3181 Bipolar II disorder: Secondary | ICD-10-CM | POA: Diagnosis not present

## 2021-12-25 ENCOUNTER — Telehealth: Payer: Self-pay | Admitting: Cardiovascular Disease

## 2021-12-25 DIAGNOSIS — R269 Unspecified abnormalities of gait and mobility: Secondary | ICD-10-CM | POA: Diagnosis not present

## 2021-12-25 DIAGNOSIS — M25561 Pain in right knee: Secondary | ICD-10-CM | POA: Diagnosis not present

## 2021-12-25 NOTE — Telephone Encounter (Signed)
Patient is returning call. Please return call to [phone].

## 2021-12-25 NOTE — Telephone Encounter (Signed)
Pt c/o medication issue:  1. Name of Medication: losartan (COZAAR) 25 MG tablet  2. How are you currently taking this medication (dosage and times per day)?  Route: Take 1 tablet (25 mg total) by mouth daily. - Oral  3. Are you having a reaction (difficulty breathing--STAT)?   4. What is your medication issue? Pt scheduled f/u 12/31/21 with [facility] and wanted to discuss increasing this dose to '50mg'$  instead of '25mg'$ 

## 2021-12-25 NOTE — Telephone Encounter (Signed)
Patient discussed medication and doses with me. She was a bit confused on her medications. SBP runs from the 130s to 150s. Recommended she bring all of her medications to her appointment on Monday with [facility].

## 2021-12-26 NOTE — Progress Notes (Signed)
Cardiology Office Note:    Date:  12/31/2021   ID:  Ashley White, DOB [birth year], MRN [id number]  PCP:  [name], [place] Providers Cardiologist:  Evalina Field, MD Cardiology APP:  Ledora Bottcher, [place] {   Referring MD: [name], MD   Chief Complaint  Patient presents with   Follow-up    History of Present Illness:    Ashley White is a 71 y.o. 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   Her last echo 06/22/20 showed LVEF 60-65%, grade 2 DD, mild to moderate MR, and very small ASD wit left to right shunting consistent with post-transeptal puncture.   She was last seen by Dr. Rayann Heman 02/28/2021 and was hypertensive.  He opted to start 25 mg of losartan.  He also reduced her Toprol to 25 mg daily due to to fatigue and depression.   She was diagnosed with COVID 05/2021 and did not require hospitalization.   She called our office requesting to increase her dose of losartan from 25 mg to 50 mg.  She was placed on my schedule for today to sort out current medications.  She is concerned about elevated BP recently. Husband is a cruise speaker and she just got back from a 3 week cruise in which she sustained a mechanical fall and had a forehead abrasion, no head imaging. She is under a lot of stress with her sister and her husband had been gone for 1 month.   She has been taking 50 mg toprol and 25 mg losartan. She has stopped walking/exercising due to this stress and is starting a program now in preparation for a 30th anniversary trip to [place].    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43   Paroxysmal atrial fibrillation ([place])    Sleep apnea    uses CPAP followed by Dr Maxwell Caul   Stroke [facility]) 2015    Past Surgical History:  Procedure Laterality Date   ATRIAL FIBRILLATION ABLATION N/A 06/20/2020   Procedure: ATRIAL FIBRILLATION ABLATION;  Surgeon: Thompson Grayer, MD;  Location: [place] [facility];  Service: Cardiovascular;  Laterality: N/A;   BREAST LUMPECTOMY  2001   malignancy removed   BREAST LUMPECTOMY WITH RADIOACTIVE SEED LOCALIZATION Right 08/09/2013   benign   BREAST SURGERY  1993   left cyst removed   BUBBLE STUDY  02/03/2020   Procedure: BUBBLE STUDY;  Surgeon: [name], MD;  Location: [place];  Service: Cardiovascular;;   CARDIOVERSION N/A 02/03/2020   Procedure: CARDIOVERSION;  Surgeon: [name], MD;  Location: [facility] [place];  Service: Cardiovascular;  Laterality: N/A;   CATARACT EXTRACTION     ELECTROPHYSIOLOGIC STUDY N/A 01/09/2016   Procedure: Atrial Fibrillation Ablation;  Surgeon: Thompson Grayer, MD;  Location: [place] [facility];  Service: Cardiovascular;  Laterality: N/A;   OOPHORECTOMY Left    right knee tibia surgery     2006   TEE WITHOUT CARDIOVERSION N/A 02/03/2020   Procedure: TRANSESOPHAGEAL ECHOCARDIOGRAM (TEE);  Surgeon: [name], MD;  Location: [facility];  Service: Cardiovascular;  Laterality:  N/A;    Current Medications: Current Meds  Medication Sig   diltiazem (CARDIZEM) 30 MG tablet TAKE 1 TABLET BY MOUTH EVERY 4 HOURS AS NEEDED FOR HEART RATE GREATER THAN 100   ELIQUIS 5 MG TABS tablet TAKE 1 TABLET(5 MG) BY MOUTH TWICE DAILY   flecainide (TAMBOCOR) 100 MG tablet TAKE 1 TABLET(100 MG) BY MOUTH TWICE DAILY   fluticasone (CUTIVATE) 0.05 % cream Apply 1 application topically daily as needed (itching).    ketoconazole (NIZORAL) 2 % cream Apply 1 application topically daily as needed (Skin irritation).    lamoTRIgine (LAMICTAL) 200 MG tablet Take 200 mg by mouth at bedtime.    losartan (COZAAR) 25 MG  tablet Take 1 tablet (25 mg total) by mouth in the morning and at bedtime.   metoprolol succinate (TOPROL XL) 25 MG 24 hr tablet Take 1 tablet (25 mg total) by mouth daily.   modafinil (PROVIGIL) 100 MG tablet Take 150 mg by mouth daily before breakfast.   Omega-3 Fatty Acids (FISH OIL PO) Take 2,200 mg by mouth at bedtime.   rosuvastatin (CRESTOR) 20 MG tablet TAKE 1 TABLET(20 MG) BY MOUTH DAILY   vortioxetine HBr (TRINTELLIX) 20 MG TABS tablet Take 20 mg by mouth in the morning.   [DISCONTINUED] losartan (COZAAR) 25 MG tablet Take 1 tablet (25 mg total) by mouth daily.     Allergies:   Penicillin g and Nickel   Social History   Socioeconomic History   Marital status: Married    Spouse name: Not on file   Number of children: 2   Years of education: Bachelor's   Highest education level: Not on file  Occupational History   Occupation: Retired  Tobacco Use   Smoking status: Never   Smokeless tobacco: Never  Vaping Use   Vaping Use: Never used  Substance and Sexual Activity   Alcohol use: Yes    Alcohol/week: 1.0 standard drink of alcohol    Types: 1 Standard drinks or equivalent per week    Comment: 1-2 times a week (2 beers), sometimes more   Drug use: No   Sexual activity: Yes  Other Topics Concern   Not on file  Social History Narrative   Patient is married.   Patient has a college education.   Patient is left handed.   Patient drinks 1.5 cups of caffeine daily.   Lives in [place].  Retired   Investment banker, operational of Radio broadcast assistant Strain: Not on [employer] Insecurity: Not on file  Transportation Needs: Not on file  Physical Activity: Not on file  Stress: Not on file  Social Connections: Not on file     Family History: The patient's family history includes Breast cancer in her paternal grandmother; Colon cancer in her father; Heart disease in her mother.  ROS:   Please see the history of present illness.     All other systems reviewed and are  negative.  EKGs/Labs/Other Studies Reviewed:    The following studies were reviewed today:  Echo 05/2020:  1. Left ventricular ejection fraction, by estimation, is 60 to 65%. The  left ventricle has normal function. The left ventricle has no regional  wall motion abnormalities. Left ventricular diastolic parameters are  consistent with Grade II diastolic  dysfunction (pseudonormalization). Elevated left atrial pressure. The  average left ventricular global longitudinal strain is 21.2 %. The global  longitudinal strain is normal.   2. Right ventricular systolic function is normal. The right ventricular  size  is normal. There is normal pulmonary artery systolic pressure.   3. Left atrial size was mildly dilated.   4. The mitral valve is normal in structure. Mild to moderate mitral valve  regurgitation. No evidence of mitral stenosis.   5. The aortic valve is normal in structure. Aortic valve regurgitation is  not visualized. Mild aortic valve sclerosis is present, with no evidence  of aortic valve stenosis.   6. The inferior vena cava is normal in size with greater than 50%  respiratory variability, suggesting right atrial pressure of 3 mmHg.   7. Evidence of atrial level shunting detected by color flow Doppler.  There is a very small secundum atrial septal defect with left to right  shunting across the atrial septum (consistent with post-transseptal  puncture defect).   EKG:  EKG is ordered today.  The ekg ordered today demonstrates sinus rhythm HR 67, repolarization abnormalities  Recent Labs: 02/28/2021: BUN 14; Creatinine, Ser 0.59; Potassium 4.7; Sodium 142  Recent Lipid Panel    Component Value Date/Time   CHOL 240 (H) 01/25/2020 0929   TRIG 65 01/25/2020 0929   HDL 89 01/25/2020 0929   CHOLHDL 2.7 01/25/2020 0929   CHOLHDL 3.6 12/25/2013 0251   VLDL 15 12/25/2013 0251   LDLCALC 140 (H) 01/25/2020 0929     Risk Assessment/Calculations:    CHA2DS2-VASc Score = 5    This indicates a 7.2% annual risk of stroke. The patient's score is based upon: CHF History: 0 HTN History: 1 Diabetes History: 0 Stroke History: 2 Vascular Disease History: 0 Age Score: 1 Gender Score: 1   Physical Exam:    VS:  BP 132/72 (BP Location: Left Arm, Patient Position: Sitting, Cuff Size: Normal)   Pulse 67   Ht '5\' 5"'$  (1.651 m)   Wt 156 lb (70.8 kg)   SpO2 100%   BMI 25.96 kg/m     Wt Readings from Last 3 Encounters:  12/31/21 156 lb (70.8 kg)  02/28/21 168 lb 3.2 oz (76.3 kg)  09/18/20 161 lb 3.2 oz (73.1 kg)     GEN:  Well nourished, well developed in no acute distress HEENT: Normal NECK: No JVD; No carotid bruits LYMPHATICS: No lymphadenopathy CARDIAC: RRR, no murmurs, rubs, gallops RESPIRATORY:  Clear to auscultation without rales, wheezing or rhonchi  ABDOMEN: Soft, non-tender, non-distended MUSCULOSKELETAL:  No edema; No deformity  SKIN: Warm and dry NEUROLOGIC:  Alert and oriented x 3 PSYCHIATRIC:  Normal affect   ASSESSMENT:    1. Essential hypertension   2. Persistent atrial fibrillation ([place])   3. Atypical atrial flutter ([place])   4. History of prior ablation treatment   5. Chronic anticoagulation   6. Mixed hyperlipidemia    PLAN:    In order of problems listed above:  Atrial fibrillation/atrial flutter S/p ablation 05/2020 EKG today with NSR, has not needed PRN cardizem   Chronic anticoagulation Doing well on Eliquis No bleeding issues   Hypertension Has been taking 25 mg losartan and 50 mg toprol BP has been running [id number] systolic Will increase this to 25 mg losartan BID I do suspect her BP will improve with reinstating an exercise program. However, given her upcoming extensive travel and then holiday season, I would like to support her BP with suspected increased dietary sodium. We will re-evaluate BP at the end of January.  BMP in 2 weeks. Provided BP log - she will call if BP consistently above  130.   Hyperlipidemia Repeat lipid panel On 20 mg  crestor   Follow up in 3 months.       Medication Adjustments/Labs and Tests Ordered: Current medicines are reviewed at length with the patient today.  Concerns regarding medicines are outlined above.  Orders Placed This Encounter  Procedures   Basic metabolic panel   Lipid panel   EKG 12-Lead   Meds ordered this encounter  Medications   losartan (COZAAR) 25 MG tablet    Sig: Take 1 tablet (25 mg total) by mouth in the morning and at bedtime.    Dispense:  90 tablet    Refill:  3    Patient Instructions  Medication Instructions:  START losartan 25 mg twice daily. *If you need a refill on your cardiac medications before your next appointment, please call your pharmacy*   Lab Work: Please return for FASTING Blood Work (BMETand Lipid Panel) in 2 weeks. No appointment needed, lab here at the office is open Monday-Friday from 8AM to 4PM and closed daily for lunch from 12:45-1:45.    If you have labs (blood work) drawn today and your tests are completely normal, you will receive your results only by: [place] (if you have MyChart) OR A paper copy in the mail If you have any lab test that is abnormal or we need to change your treatment, we will call you to review the results.   Follow-Up: At [facility], you and your health needs are our priority.  As part of our continuing mission to provide you with exceptional heart care, we have created designated Provider Care Teams.  These Care Teams include your primary Cardiologist (physician) and Advanced Practice Providers (APPs -  Physician Assistants and Nurse Practitioners) who all work together to provide you with the care you need, when you need it.  Your next appointment:   3 or 4  month(s)  The format for your next appointment:   In Person  Provider:   Evalina Field, MD  or Fabian Sharp, PA-C       Other Instructions Please check your blood pressure  every day in the same time, sitting for 5 minutes or more.    Signed, Ledora Bottcher, [place]  12/31/2021 10:47 AM    [place]

## 2021-12-27 DIAGNOSIS — G4733 Obstructive sleep apnea (adult) (pediatric): Secondary | ICD-10-CM | POA: Diagnosis not present

## 2021-12-27 DIAGNOSIS — R030 Elevated blood-pressure reading, without diagnosis of hypertension: Secondary | ICD-10-CM | POA: Diagnosis not present

## 2021-12-31 ENCOUNTER — Encounter: Payer: Self-pay | Admitting: Physician Assistant

## 2021-12-31 ENCOUNTER — Ambulatory Visit: Payer: Medicare Other | Attending: Physician Assistant | Admitting: Physician Assistant

## 2021-12-31 VITALS — BP 132/72 | HR 67 | Ht 65.0 in | Wt 156.0 lb

## 2021-12-31 DIAGNOSIS — I10 Essential (primary) hypertension: Secondary | ICD-10-CM | POA: Diagnosis not present

## 2021-12-31 DIAGNOSIS — I4819 Other persistent atrial fibrillation: Secondary | ICD-10-CM

## 2021-12-31 DIAGNOSIS — E782 Mixed hyperlipidemia: Secondary | ICD-10-CM

## 2021-12-31 DIAGNOSIS — I484 Atypical atrial flutter: Secondary | ICD-10-CM

## 2021-12-31 DIAGNOSIS — Z9889 Other specified postprocedural states: Secondary | ICD-10-CM

## 2021-12-31 DIAGNOSIS — Z7901 Long term (current) use of anticoagulants: Secondary | ICD-10-CM

## 2021-12-31 MED ORDER — LOSARTAN POTASSIUM 25 MG PO TABS
25.0000 mg | ORAL_TABLET | Freq: Two times a day (BID) | ORAL | 3 refills | Status: DC
Start: 2021-12-31 — End: 2022-08-14

## 2021-12-31 NOTE — Patient Instructions (Addendum)
Medication Instructions:  START losartan 25 mg twice daily. *If you need a refill on your cardiac medications before your next appointment, please call your pharmacy*   Lab Work: Please return for FASTING Blood Work (BMETand Lipid Panel) in 2 weeks. No appointment needed, lab here at the office is open Monday-Friday from 8AM to 4PM and closed daily for lunch from 12:45-1:45.    If you have labs (blood work) drawn today and your tests are completely normal, you will receive your results only by: [place] (if you have MyChart) OR A paper copy in the mail If you have any lab test that is abnormal or we need to change your treatment, we will call you to review the results.   Follow-Up: At [facility], you and your health needs are our priority.  As part of our continuing mission to provide you with exceptional heart care, we have created designated Provider Care Teams.  These Care Teams include your primary Cardiologist (physician) and Advanced Practice Providers (APPs -  Physician Assistants and Nurse Practitioners) who all work together to provide you with the care you need, when you need it.  Your next appointment:   3 or 4  month(s)  The format for your next appointment:   In Person  Provider:   Evalina Field, MD  or Fabian Sharp, PA-C       Other Instructions Please check your blood pressure every day in the same time, sitting for 5 minutes or more.

## 2022-01-04 DIAGNOSIS — G4733 Obstructive sleep apnea (adult) (pediatric): Secondary | ICD-10-CM | POA: Diagnosis not present

## 2022-01-05 DIAGNOSIS — G4733 Obstructive sleep apnea (adult) (pediatric): Secondary | ICD-10-CM | POA: Diagnosis not present

## 2022-01-07 DIAGNOSIS — F908 Attention-deficit hyperactivity disorder, other type: Secondary | ICD-10-CM | POA: Diagnosis not present

## 2022-01-07 DIAGNOSIS — F3181 Bipolar II disorder: Secondary | ICD-10-CM | POA: Diagnosis not present

## 2022-01-08 DIAGNOSIS — R269 Unspecified abnormalities of gait and mobility: Secondary | ICD-10-CM | POA: Diagnosis not present

## 2022-01-08 DIAGNOSIS — M25561 Pain in right knee: Secondary | ICD-10-CM | POA: Diagnosis not present

## 2022-01-21 DIAGNOSIS — F908 Attention-deficit hyperactivity disorder, other type: Secondary | ICD-10-CM | POA: Diagnosis not present

## 2022-01-21 DIAGNOSIS — F3181 Bipolar II disorder: Secondary | ICD-10-CM | POA: Diagnosis not present

## 2022-01-23 ENCOUNTER — Other Ambulatory Visit: Payer: Self-pay | Admitting: Internal Medicine

## 2022-01-23 NOTE — Telephone Encounter (Signed)
This is a A-Fib clinic pt that Dr. Rayann Heman wants to continue in the A-Fib clinic. Please address

## 2022-01-25 DIAGNOSIS — E782 Mixed hyperlipidemia: Secondary | ICD-10-CM | POA: Diagnosis not present

## 2022-01-25 DIAGNOSIS — I10 Essential (primary) hypertension: Secondary | ICD-10-CM | POA: Diagnosis not present

## 2022-01-25 LAB — BASIC METABOLIC PANEL
BUN/Creatinine Ratio: 23 (ref 12–28)
BUN: 19 mg/dL (ref 8–27)
CO2: 26 mmol/L (ref 20–29)
Calcium: 9.7 mg/dL (ref 8.7–10.3)
Chloride: 105 mmol/L (ref 96–106)
Creatinine, Ser: 0.82 mg/dL (ref 0.57–1.00)
Glucose: 104 mg/dL — ABNORMAL HIGH (ref 70–99)
Potassium: 4.6 mmol/L (ref 3.5–5.2)
Sodium: 143 mmol/L (ref 134–144)
eGFR: 77 mL/min/{1.73_m2} (ref >59)

## 2022-01-25 LAB — LIPID PANEL
Chol/HDL Ratio: 2.2 ratio (ref 0.0–4.4)
Cholesterol, Total: 174 mg/dL (ref 100–199)
HDL: 80 mg/dL (ref >39)
LDL Chol Calc (NIH): 84 mg/dL (ref 0–99)
Triglycerides: 48 mg/dL (ref 0–149)
VLDL Cholesterol Cal: 10 mg/dL (ref 5–40)

## 2022-01-29 DIAGNOSIS — G4733 Obstructive sleep apnea (adult) (pediatric): Secondary | ICD-10-CM | POA: Diagnosis not present

## 2022-01-31 ENCOUNTER — Encounter: Payer: Self-pay | Admitting: *Deleted

## 2022-02-04 DIAGNOSIS — G4733 Obstructive sleep apnea (adult) (pediatric): Secondary | ICD-10-CM | POA: Diagnosis not present

## 2022-02-18 ENCOUNTER — Other Ambulatory Visit: Payer: Self-pay | Admitting: Cardiovascular Disease

## 2022-02-28 DIAGNOSIS — G4733 Obstructive sleep apnea (adult) (pediatric): Secondary | ICD-10-CM | POA: Diagnosis not present

## 2022-03-06 DIAGNOSIS — G4733 Obstructive sleep apnea (adult) (pediatric): Secondary | ICD-10-CM | POA: Diagnosis not present

## 2022-03-18 DIAGNOSIS — F908 Attention-deficit hyperactivity disorder, other type: Secondary | ICD-10-CM | POA: Diagnosis not present

## 2022-03-18 DIAGNOSIS — F3181 Bipolar II disorder: Secondary | ICD-10-CM | POA: Diagnosis not present

## 2022-03-19 DIAGNOSIS — M25561 Pain in right knee: Secondary | ICD-10-CM | POA: Diagnosis not present

## 2022-03-19 DIAGNOSIS — R269 Unspecified abnormalities of gait and mobility: Secondary | ICD-10-CM | POA: Diagnosis not present

## 2022-03-31 DIAGNOSIS — G4733 Obstructive sleep apnea (adult) (pediatric): Secondary | ICD-10-CM | POA: Diagnosis not present

## 2022-04-08 DIAGNOSIS — G4733 Obstructive sleep apnea (adult) (pediatric): Secondary | ICD-10-CM | POA: Diagnosis not present

## 2022-04-08 DIAGNOSIS — F908 Attention-deficit hyperactivity disorder, other type: Secondary | ICD-10-CM | POA: Diagnosis not present

## 2022-04-08 DIAGNOSIS — F329 Major depressive disorder, single episode, unspecified: Secondary | ICD-10-CM | POA: Diagnosis not present

## 2022-04-08 DIAGNOSIS — F3181 Bipolar II disorder: Secondary | ICD-10-CM | POA: Diagnosis not present

## 2022-04-08 DIAGNOSIS — G4730 Sleep apnea, unspecified: Secondary | ICD-10-CM | POA: Diagnosis not present

## 2022-04-09 DIAGNOSIS — M25561 Pain in right knee: Secondary | ICD-10-CM | POA: Diagnosis not present

## 2022-04-09 DIAGNOSIS — M79671 Pain in right foot: Secondary | ICD-10-CM | POA: Diagnosis not present

## 2022-04-09 DIAGNOSIS — R197 Diarrhea, unspecified: Secondary | ICD-10-CM | POA: Diagnosis not present

## 2022-04-09 DIAGNOSIS — R269 Unspecified abnormalities of gait and mobility: Secondary | ICD-10-CM | POA: Diagnosis not present

## 2022-04-09 DIAGNOSIS — R229 Localized swelling, mass and lump, unspecified: Secondary | ICD-10-CM | POA: Diagnosis not present

## 2022-04-19 ENCOUNTER — Other Ambulatory Visit: Payer: Self-pay | Admitting: Internal Medicine

## 2022-04-21 NOTE — Progress Notes (Deleted)
Cardiology Office Note:   Date:  04/21/2022  NAME:  Ashley White    MRN: [id number] DOB:  15-Feb-1951   PCP:  [name], MD  Cardiologist:  Evalina Field, MD  Electrophysiologist:  None   Referring MD: [name], MD   No chief complaint on file. ***  History of Present Illness:   Ashley White is a 72 y.o. female with a hx of Afib/flutter s/p ablation x 2 who presents for follow-up.   Problem List 1. Paroxysmal Afib/flutter -ablation for Afib/flutter 01/09/2016 -CHADSVASC=4 (age, female, stroke) -repeat ablation 05/2020 2. OSA on CPAP 3. HLD 4. Ehlers-Danlos 5. HTN  Past Medical History: Past Medical History:  Diagnosis Date   ADD (attention deficit disorder)    Arrhythmia    Cancer ([place]) 2001   left breast, treated with lumpectomy, radiation, and chemotherapy   Depression    Mitral valve prolapse    Myocarditis ([place])    at age 19   Paroxysmal atrial fibrillation ([place])    Sleep apnea    uses CPAP followed by Dr Maxwell Caul   Stroke [facility]) 2015    Past Surgical History: Past Surgical History:  Procedure Laterality Date   ATRIAL FIBRILLATION ABLATION N/A 06/20/2020   Procedure: ATRIAL FIBRILLATION ABLATION;  Surgeon: Thompson Grayer, MD;  Location: [place] [facility];  Service: Cardiovascular;  Laterality: N/A;   BREAST LUMPECTOMY  2001   malignancy removed   BREAST LUMPECTOMY WITH RADIOACTIVE SEED LOCALIZATION Right 08/09/2013   benign   BREAST SURGERY  1993   left cyst removed   BUBBLE STUDY  02/03/2020   Procedure: BUBBLE STUDY;  Surgeon: [name], MD;  Location: [place];  Service: Cardiovascular;;   CARDIOVERSION N/A 02/03/2020   Procedure: CARDIOVERSION;  Surgeon: [name], MD;  Location: [place];  Service: Cardiovascular;  Laterality: N/A;   CATARACT EXTRACTION     ELECTROPHYSIOLOGIC STUDY N/A 01/09/2016   Procedure: Atrial Fibrillation Ablation;  Surgeon: Thompson Grayer, MD;  Location: [place] [facility];  Service: Cardiovascular;   Laterality: N/A;   OOPHORECTOMY Left    right knee tibia surgery     2006   TEE WITHOUT CARDIOVERSION N/A 02/03/2020   Procedure: TRANSESOPHAGEAL ECHOCARDIOGRAM (TEE);  Surgeon: [name], MD;  Location: [facility];  Service: Cardiovascular;  Laterality: N/A;    Current Medications: No outpatient medications have been marked as taking for the 04/24/22 encounter (Appointment) with O'Neal, Cassie Freer, MD.     Allergies:    Penicillin g and Nickel   Social History: Social History   Socioeconomic History   Marital status: Married    Spouse name: Not on file   Number of children: 2   Years of education: Bachelor's   Highest education level: Not on file  Occupational History   Occupation: Retired  Tobacco Use   Smoking status: Never   Smokeless tobacco: Never  Vaping Use   Vaping Use: Never used  Substance and Sexual Activity   Alcohol use: Yes    Alcohol/week: 1.0 standard drink of alcohol    Types: 1 Standard drinks or equivalent per week    Comment: 1-2 times a week (2 beers), sometimes more   Drug use: No   Sexual activity: Yes  Other Topics Concern   Not on file  Social History Narrative   Patient is married.   Patient has a college education.   Patient is left handed.   Patient drinks 1.5 cups of caffeine daily.  Lives in [place].  Retired   Investment banker, operational of Radio broadcast assistant Strain: Not on [employer] Insecurity: Not on file  Transportation Needs: Not on file  Physical Activity: Not on file  Stress: Not on file  Social Connections: Not on file     Family History: The patient's ***family history includes Breast cancer in her paternal grandmother; Colon cancer in her father; Heart disease in her mother.  ROS:   All other ROS reviewed and negative. Pertinent positives noted in the HPI.     EKGs/Labs/Other Studies Reviewed:   The following studies were personally reviewed by me today:  EKG:  EKG is *** ordered today.  The  ekg ordered today demonstrates ***, and was personally reviewed by me.   TTE 06/22/2020  1. Left ventricular ejection fraction, by estimation, is 60 to 65%. The  left ventricle has normal function. The left ventricle has no regional  wall motion abnormalities. Left ventricular diastolic parameters are  consistent with Grade II diastolic  dysfunction (pseudonormalization). Elevated left atrial pressure. The  average left ventricular global longitudinal strain is 21.2 %. The global  longitudinal strain is normal.   2. Right ventricular systolic function is normal. The right ventricular  size is normal. There is normal pulmonary artery systolic pressure.   3. Left atrial size was mildly dilated.   4. The mitral valve is normal in structure. Mild to moderate mitral valve  regurgitation. No evidence of mitral stenosis.   5. The aortic valve is normal in structure. Aortic valve regurgitation is  not visualized. Mild aortic valve sclerosis is present, with no evidence  of aortic valve stenosis.   6. The inferior vena cava is normal in size with greater than 50%  respiratory variability, suggesting right atrial pressure of 3 mmHg.   7. Evidence of atrial level shunting detected by color flow Doppler.  There is a very small secundum atrial septal defect with left to right  shunting across the atrial septum (consistent with post-transseptal  puncture defect).    Recent Labs: 01/25/2022: BUN 19; Creatinine, Ser 0.82; Potassium 4.6; Sodium 143   Recent Lipid Panel    Component Value Date/Time   CHOL 174 01/25/2022 0944   TRIG 48 01/25/2022 0944   HDL 80 01/25/2022 0944   CHOLHDL 2.2 01/25/2022 0944   CHOLHDL 3.6 12/25/2013 0251   VLDL 15 12/25/2013 0251   LDLCALC 84 01/25/2022 0944    Physical Exam:   VS:  There were no vitals taken for this visit.   Wt Readings from Last 3 Encounters:  12/31/21 156 lb (70.8 kg)  02/28/21 168 lb 3.2 oz (76.3 kg)  09/18/20 161 lb 3.2 oz (73.1 kg)     General: Well nourished, well developed, in no acute distress Head: Atraumatic, normal size  Eyes: PEERLA, EOMI  Neck: Supple, no JVD Endocrine: No thryomegaly Cardiac: Normal S1, S2; RRR; no murmurs, rubs, or gallops Lungs: Clear to auscultation bilaterally, no wheezing, rhonchi or rales  Abd: Soft, nontender, no hepatomegaly  Ext: No edema, pulses 2+ Musculoskeletal: No deformities, BUE and BLE strength normal and equal Skin: Warm and dry, no rashes   Neuro: Alert and oriented to person, place, time, and situation, CNII-XII grossly intact, no focal deficits  Psych: Normal mood and affect   ASSESSMENT:   Kiah Campoli Woehl is a 72 y.o. female who presents for the following: No diagnosis found.  PLAN:   There are no diagnoses linked to this encounter.  {Are  you ordering a CV Procedure (e.g. stress test, cath, DCCV, TEE, etc)?   Press F2        :[id number]  Disposition: No follow-ups on file.  Medication Adjustments/Labs and Tests Ordered: Current medicines are reviewed at length with the patient today.  Concerns regarding medicines are outlined above.  No orders of the defined types were placed in this encounter.  No orders of the defined types were placed in this encounter.   There are no Patient Instructions on file for this visit.   Time Spent with Patient: I have spent a total of *** minutes with patient reviewing hospital notes, telemetry, EKGs, labs and examining the patient as well as establishing an assessment and plan that was discussed with the patient.  > 50% of time was spent in direct patient care.  Signed, Addison Naegeli. Audie Box, MD, [place]  [address], [place], [place] [address] [phone]  04/21/2022 9:46 AM

## 2022-04-22 DIAGNOSIS — F908 Attention-deficit hyperactivity disorder, other type: Secondary | ICD-10-CM | POA: Diagnosis not present

## 2022-04-22 DIAGNOSIS — F3181 Bipolar II disorder: Secondary | ICD-10-CM | POA: Diagnosis not present

## 2022-04-23 DIAGNOSIS — K080 Exfoliation of teeth due to systemic causes: Secondary | ICD-10-CM | POA: Diagnosis not present

## 2022-04-24 ENCOUNTER — Encounter: Payer: Self-pay | Admitting: Cardiovascular Disease

## 2022-04-24 ENCOUNTER — Ambulatory Visit: Payer: Medicare Other | Admitting: Cardiovascular Disease

## 2022-04-24 DIAGNOSIS — I10 Essential (primary) hypertension: Secondary | ICD-10-CM

## 2022-04-24 DIAGNOSIS — Z9889 Other specified postprocedural states: Secondary | ICD-10-CM

## 2022-04-24 DIAGNOSIS — I484 Atypical atrial flutter: Secondary | ICD-10-CM

## 2022-04-24 DIAGNOSIS — I4819 Other persistent atrial fibrillation: Secondary | ICD-10-CM

## 2022-04-29 DIAGNOSIS — F3181 Bipolar II disorder: Secondary | ICD-10-CM | POA: Diagnosis not present

## 2022-04-29 DIAGNOSIS — F908 Attention-deficit hyperactivity disorder, other type: Secondary | ICD-10-CM | POA: Diagnosis not present

## 2022-04-30 DIAGNOSIS — M25561 Pain in right knee: Secondary | ICD-10-CM | POA: Diagnosis not present

## 2022-04-30 DIAGNOSIS — R269 Unspecified abnormalities of gait and mobility: Secondary | ICD-10-CM | POA: Diagnosis not present

## 2022-05-06 DIAGNOSIS — F3181 Bipolar II disorder: Secondary | ICD-10-CM | POA: Diagnosis not present

## 2022-05-06 DIAGNOSIS — F908 Attention-deficit hyperactivity disorder, other type: Secondary | ICD-10-CM | POA: Diagnosis not present

## 2022-05-09 ENCOUNTER — Encounter (HOSPITAL_COMMUNITY): Payer: Self-pay | Admitting: *Deleted

## 2022-05-09 DIAGNOSIS — G4733 Obstructive sleep apnea (adult) (pediatric): Secondary | ICD-10-CM | POA: Diagnosis not present

## 2022-05-13 DIAGNOSIS — F908 Attention-deficit hyperactivity disorder, other type: Secondary | ICD-10-CM | POA: Diagnosis not present

## 2022-05-13 DIAGNOSIS — F3181 Bipolar II disorder: Secondary | ICD-10-CM | POA: Diagnosis not present

## 2022-05-15 DIAGNOSIS — M25561 Pain in right knee: Secondary | ICD-10-CM | POA: Diagnosis not present

## 2022-05-15 DIAGNOSIS — R269 Unspecified abnormalities of gait and mobility: Secondary | ICD-10-CM | POA: Diagnosis not present

## 2022-05-20 DIAGNOSIS — F908 Attention-deficit hyperactivity disorder, other type: Secondary | ICD-10-CM | POA: Diagnosis not present

## 2022-05-20 DIAGNOSIS — F3181 Bipolar II disorder: Secondary | ICD-10-CM | POA: Diagnosis not present

## 2022-05-21 ENCOUNTER — Other Ambulatory Visit: Payer: Self-pay

## 2022-05-21 DIAGNOSIS — Z0000 Encounter for general adult medical examination without abnormal findings: Secondary | ICD-10-CM | POA: Diagnosis not present

## 2022-05-21 DIAGNOSIS — I480 Paroxysmal atrial fibrillation: Secondary | ICD-10-CM

## 2022-05-21 DIAGNOSIS — D6869 Other thrombophilia: Secondary | ICD-10-CM | POA: Diagnosis not present

## 2022-05-21 DIAGNOSIS — F319 Bipolar disorder, unspecified: Secondary | ICD-10-CM | POA: Diagnosis not present

## 2022-05-21 DIAGNOSIS — Z23 Encounter for immunization: Secondary | ICD-10-CM | POA: Diagnosis not present

## 2022-05-21 MED ORDER — APIXABAN 5 MG PO TABS: ORAL_TABLET | ORAL | 1 refills | Status: DC

## 2022-05-21 NOTE — Telephone Encounter (Signed)
Prescription refill request for Eliquis received. Indication: afib  Last office visit: 12/31/2021, [facility] Scr: 0.82, 01/25/2022 Age: 72 yo  Weight: 70.8 kg  Refill sent.

## 2022-05-27 DIAGNOSIS — F908 Attention-deficit hyperactivity disorder, other type: Secondary | ICD-10-CM | POA: Diagnosis not present

## 2022-05-27 DIAGNOSIS — F3181 Bipolar II disorder: Secondary | ICD-10-CM | POA: Diagnosis not present

## 2022-05-28 NOTE — Progress Notes (Unsigned)
Cardiology Office Note:   Date:  05/29/2022  NAME:  Ashley White    MRN: [id number] DOB:  March 17, 1951   PCP:  [name], MD  Cardiologist:  Evalina Field, MD  Electrophysiologist:  None   Referring MD: [name], MD   Chief Complaint  Patient presents with   Follow-up        History of Present Illness:   Ashley White is a 72 y.o. female with a hx of Afib s/p ablation x 2, HTN who presents for follow-up.  She presents for follow-up.  She is doing well.  No recurrence of A-fib.  Remains on flecainide.  She reports she tried off of this but symptoms of palpitations occurred.  She will remain on this.  Blood pressure has been a bit elevated but seems to be within limits today.  She is walking up to 2 miles per day.  No chest pain or trouble breathing.  No coronary calcium.  LDL cholesterol at goal.  She has had a stroke but this was in the setting of A-fib.  She denies any major symptoms in office today.  She seems to be doing well.  Problem List 1. Paroxysmal Afib -ablation 12/2015 & 05/2020 -CHADSVASC=4 (age, female, stroke) -on flecanide -CAC = 0  2. Atrial flutter -01/31/2020 -TEE/DCCV 02/03/2020 -DCCV 03/05/2020 ([place]) 3. OSA on CPAP 4. HLD -T chol 174, HDL 80, LDL 84, TG 48 5. Ehlers-Danlos 6. 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50   Paroxysmal atrial fibrillation ([place])    Sleep apnea    uses CPAP followed by Dr Maxwell Caul   Stroke [facility]) 2015    Past Surgical History: Past Surgical History:  Procedure Laterality Date   ATRIAL FIBRILLATION ABLATION N/A 06/20/2020   Procedure: ATRIAL FIBRILLATION ABLATION;  Surgeon: Thompson Grayer, MD;  Location: [place] CV LAB;  Service: Cardiovascular;  Laterality: N/A;   BREAST LUMPECTOMY   2001   malignancy removed   BREAST LUMPECTOMY WITH RADIOACTIVE SEED LOCALIZATION Right 08/09/2013   benign   BREAST SURGERY  1993   left cyst removed   BUBBLE STUDY  02/03/2020   Procedure: BUBBLE STUDY;  Surgeon: [name], MD;  Location: [place];  Service: Cardiovascular;;   CARDIOVERSION N/A 02/03/2020   Procedure: CARDIOVERSION;  Surgeon: [name], MD;  Location: [place];  Service: Cardiovascular;  Laterality: N/A;   CATARACT EXTRACTION     ELECTROPHYSIOLOGIC STUDY N/A 01/09/2016   Procedure: Atrial Fibrillation Ablation;  Surgeon: Thompson Grayer, MD;  Location: [place] CV LAB;  Service: Cardiovascular;  Laterality: N/A;   OOPHORECTOMY Left    right knee tibia surgery     2006   TEE WITHOUT CARDIOVERSION N/A 02/03/2020   Procedure: TRANSESOPHAGEAL ECHOCARDIOGRAM (TEE);  Surgeon: [name], MD;  Location: [facility] ENDOSCOPY;  Service: Cardiovascular;  Laterality: N/A;    Current Medications: Current Meds  Medication Sig   apixaban (ELIQUIS) 5 MG TABS tablet TAKE 1 TABLET(5 MG) BY MOUTH TWICE DAILY   cetirizine (ZYRTEC) 10 MG tablet Take 10 mg by mouth daily as needed for allergies.   diltiazem (CARDIZEM) 30 MG tablet TAKE 1 TABLET BY MOUTH EVERY 4 HOURS AS NEEDED FOR HEART  RATE GREATER THAN 100   flecainide (TAMBOCOR) 100 MG tablet TAKE 1 TABLET(100 MG) BY MOUTH TWICE DAILY   fluticasone (CUTIVATE) 0.05 % cream Apply 1 application topically daily as needed (itching).    ketoconazole (NIZORAL) 2 % cream Apply 1 application topically daily as needed (Skin irritation).    lamoTRIgine (LAMICTAL) 200 MG tablet Take 200 mg by mouth at bedtime.    losartan (COZAAR) 25 MG tablet Take 1 tablet (25 mg total) by mouth in the morning and at bedtime.   modafinil (PROVIGIL) 100 MG tablet Take 150 mg by mouth daily before breakfast.   Omega-3 Fatty Acids (FISH OIL PO) Take 2,200 mg by mouth at bedtime.   rosuvastatin (CRESTOR) 20 MG tablet TAKE 1 TABLET(20 MG) BY  MOUTH DAILY   vortioxetine HBr (TRINTELLIX) 20 MG TABS tablet Take 20 mg by mouth in the morning.   [DISCONTINUED] metoprolol succinate (TOPROL-XL) 25 MG 24 hr tablet TAKE 1 TABLET(25 MG) BY MOUTH DAILY   [DISCONTINUED] metoprolol succinate (TOPROL-XL) 50 MG 24 hr tablet Take 50 mg by mouth daily. Take with or immediately following a meal.     Allergies:    Penicillin g and Nickel   Social History: Social History   Socioeconomic History   Marital status: Married    Spouse name: Not on file   Number of children: 2   Years of education: Bachelor's   Highest education level: Not on file  Occupational History   Occupation: Retired  Tobacco Use   Smoking status: Never   Smokeless tobacco: Never  Vaping Use   Vaping Use: Never used  Substance and Sexual Activity   Alcohol use: Yes    Alcohol/week: 1.0 standard drink of alcohol    Types: 1 Standard drinks or equivalent per week    Comment: 1-2 times a week (2 beers), sometimes more   Drug use: No   Sexual activity: Yes  Other Topics Concern   Not on file  Social History Narrative   Patient is married.   Patient has a college education.   Patient is left handed.   Patient drinks 1.5 cups of caffeine daily.   Lives in [place].  Retired   Investment banker, operational of Radio broadcast assistant Strain: Not on [employer] Insecurity: Not on file  Transportation Needs: Not on file  Physical Activity: Not on file  Stress: Not on file  Social Connections: Not on file     Family History: The patient's family history includes Breast cancer in her paternal grandmother; Colon cancer in her father; Heart disease in her mother.  ROS:   All other ROS reviewed and negative. Pertinent positives noted in the HPI.     EKGs/Labs/Other Studies Reviewed:   The following studies were personally reviewed by me today:  Recent Labs: 01/25/2022: BUN 19; Creatinine, Ser 0.82; Potassium 4.6; Sodium 143   Recent Lipid Panel    Component  Value Date/Time   CHOL 174 01/25/2022 0944   TRIG 48 01/25/2022 0944   HDL 80 01/25/2022 0944   CHOLHDL 2.2 01/25/2022 0944   CHOLHDL 3.6 12/25/2013 0251   VLDL 15 12/25/2013 0251   LDLCALC 84 01/25/2022 0944    Physical Exam:   VS:  BP 138/60   Pulse 60   Ht '5\' 5"'$  (1.651 m)   Wt 141 lb 3.2 oz (64 kg)   SpO2 98%   BMI 23.50 kg/m    Wt Readings from Last 3 Encounters:  05/29/22 141 lb 3.2  oz (64 kg)  12/31/21 156 lb (70.8 kg)  02/28/21 168 lb 3.2 oz (76.3 kg)    General: Well nourished, well developed, in no acute distress Head: Atraumatic, normal size  Eyes: PEERLA, EOMI  Neck: Supple, no JVD Endocrine: No thryomegaly Cardiac: Normal S1, S2; RRR; no murmurs, rubs, or gallops Lungs: Clear to auscultation bilaterally, no wheezing, rhonchi or rales  Abd: Soft, nontender, no hepatomegaly  Ext: No edema, pulses 2+ Musculoskeletal: No deformities, BUE and BLE strength normal and equal Skin: Warm and dry, no rashes   Neuro: Alert and oriented to person, place, time, and situation, CNII-XII grossly intact, no focal deficits  Psych: Normal mood and affect   ASSESSMENT:   Adrian Pruski Lukens is a 72 y.o. female who presents for the following: 1. Persistent atrial fibrillation ([place])   2. Acquired thrombophilia ([place])   3. Essential hypertension     PLAN:   1. Persistent atrial fibrillation ([place]) 2. Acquired thrombophilia ([place]) -Status post A-fib ablation x 2.  Remains on flecainide 100 mg twice daily.  Prefers to stay on this to reduce her risk of recurrence.  We will continue diltiazem 30 mg as needed.  She is on metoprolol succinate 50 mg daily.  Her blood pressure is well-controlled.  She will continue current regimen.  She is on Eliquis.  No issues.  She denies any major symptoms in office today.  She will continue to see [place] yearly.  3. Essential hypertension -Continue current medications.  BP goal less than 140/90 is acceptable.  Disposition: Return in about 1 year (around  05/30/2023).  Medication Adjustments/Labs and Tests Ordered: Current medicines are reviewed at length with the patient today.  Concerns regarding medicines are outlined above.  No orders of the defined types were placed in this encounter.  Meds ordered this encounter  Medications   metoprolol succinate (TOPROL-XL) 50 MG 24 hr tablet    Sig: Take 1 tablet (50 mg total) by mouth daily. Take with or immediately following a meal.    Dispense:  90 tablet    Refill:  3    Patient Instructions  Medication Instructions:  INCREASE Metoprolol to 50 mg daily   *If you need a refill on your cardiac medications before your next appointment, please call your pharmacy*  Follow-Up: At [facility], you and your health needs are our priority.  As part of our continuing mission to provide you with exceptional heart care, we have created designated Provider Care Teams.  These Care Teams include your primary Cardiologist (physician) and Advanced Practice Providers (APPs -  Physician Assistants and Nurse Practitioners) who all work together to provide you with the care you need, when you need it.  We recommend signing up for the patient portal called "MyChart".  Sign up information is provided on this After Visit Summary.  MyChart is used to connect with patients for Virtual Visits (Telemedicine).  Patients are able to view lab/test results, encounter notes, upcoming appointments, etc.  Non-urgent messages can be sent to your provider as well.   To learn more about what you can do with MyChart, go to [email or web address].    Your next appointment:   12 month(s)  Provider:   Evalina Field, MD      Time Spent with Patient: I have spent a total of 25 minutes with patient reviewing hospital notes, telemetry, EKGs, labs and examining the patient as well as establishing an assessment and plan that was discussed with the patient.  >  50% of time was spent in direct patient care.  Signed, Addison Naegeli. Audie Box, MD, [place]  [address] [place], [place] [address] [phone]  05/29/2022 3:30 PM

## 2022-05-29 ENCOUNTER — Encounter: Payer: Self-pay | Admitting: Cardiovascular Disease

## 2022-05-29 ENCOUNTER — Ambulatory Visit: Payer: Medicare Other | Attending: Cardiovascular Disease | Admitting: Cardiovascular Disease

## 2022-05-29 VITALS — BP 138/60 | HR 60 | Ht 65.0 in | Wt 141.2 lb

## 2022-05-29 DIAGNOSIS — I10 Essential (primary) hypertension: Secondary | ICD-10-CM

## 2022-05-29 DIAGNOSIS — D6869 Other thrombophilia: Secondary | ICD-10-CM

## 2022-05-29 DIAGNOSIS — I4819 Other persistent atrial fibrillation: Secondary | ICD-10-CM

## 2022-05-29 DIAGNOSIS — Z124 Encounter for screening for malignant neoplasm of cervix: Secondary | ICD-10-CM | POA: Diagnosis not present

## 2022-05-29 DIAGNOSIS — Z6823 Body mass index (BMI) 23.0-23.9, adult: Secondary | ICD-10-CM | POA: Diagnosis not present

## 2022-05-29 DIAGNOSIS — Z01419 Encounter for gynecological examination (general) (routine) without abnormal findings: Secondary | ICD-10-CM | POA: Diagnosis not present

## 2022-05-29 MED ORDER — METOPROLOL SUCCINATE ER 50 MG PO TB24: 50.0000 mg | ORAL_TABLET | Freq: Every day | ORAL | 3 refills | Status: DC

## 2022-05-29 NOTE — Patient Instructions (Signed)
Medication Instructions:  INCREASE Metoprolol to 50 mg daily   *If you need a refill on your cardiac medications before your next appointment, please call your pharmacy*  Follow-Up: At [facility], you and your health needs are our priority.  As part of our continuing mission to provide you with exceptional heart care, we have created designated Provider Care Teams.  These Care Teams include your primary Cardiologist (physician) and Advanced Practice Providers (APPs -  Physician Assistants and Nurse Practitioners) who all work together to provide you with the care you need, when you need it.  We recommend signing up for the patient portal called "MyChart".  Sign up information is provided on this After Visit Summary.  MyChart is used to connect with patients for Virtual Visits (Telemedicine).  Patients are able to view lab/test results, encounter notes, upcoming appointments, etc.  Non-urgent messages can be sent to your provider as well.   To learn more about what you can do with MyChart, go to [email or web address].    Your next appointment:   12 month(s)  Provider:   Evalina Field, MD

## 2022-05-30 DIAGNOSIS — M85851 Other specified disorders of bone density and structure, right thigh: Secondary | ICD-10-CM | POA: Diagnosis not present

## 2022-05-30 DIAGNOSIS — Z780 Asymptomatic menopausal state: Secondary | ICD-10-CM | POA: Diagnosis not present

## 2022-05-30 DIAGNOSIS — Z1231 Encounter for screening mammogram for malignant neoplasm of breast: Secondary | ICD-10-CM | POA: Diagnosis not present

## 2022-05-30 DIAGNOSIS — M85852 Other specified disorders of bone density and structure, left thigh: Secondary | ICD-10-CM | POA: Diagnosis not present

## 2022-06-03 DIAGNOSIS — R197 Diarrhea, unspecified: Secondary | ICD-10-CM | POA: Diagnosis not present

## 2022-06-03 DIAGNOSIS — R109 Unspecified abdominal pain: Secondary | ICD-10-CM | POA: Diagnosis not present

## 2022-06-03 DIAGNOSIS — F908 Attention-deficit hyperactivity disorder, other type: Secondary | ICD-10-CM | POA: Diagnosis not present

## 2022-06-03 DIAGNOSIS — F3181 Bipolar II disorder: Secondary | ICD-10-CM | POA: Diagnosis not present

## 2022-06-07 DIAGNOSIS — G4733 Obstructive sleep apnea (adult) (pediatric): Secondary | ICD-10-CM | POA: Diagnosis not present

## 2022-06-10 DIAGNOSIS — F908 Attention-deficit hyperactivity disorder, other type: Secondary | ICD-10-CM | POA: Diagnosis not present

## 2022-06-10 DIAGNOSIS — F3181 Bipolar II disorder: Secondary | ICD-10-CM | POA: Diagnosis not present

## 2022-06-13 DIAGNOSIS — R269 Unspecified abnormalities of gait and mobility: Secondary | ICD-10-CM | POA: Diagnosis not present

## 2022-06-13 DIAGNOSIS — M25561 Pain in right knee: Secondary | ICD-10-CM | POA: Diagnosis not present

## 2022-07-25 DIAGNOSIS — R197 Diarrhea, unspecified: Secondary | ICD-10-CM | POA: Diagnosis not present

## 2022-07-26 DIAGNOSIS — R197 Diarrhea, unspecified: Secondary | ICD-10-CM | POA: Diagnosis not present

## 2022-07-30 ENCOUNTER — Ambulatory Visit: Payer: Medicare Other | Admitting: Cardiovascular Disease

## 2022-07-30 DIAGNOSIS — R269 Unspecified abnormalities of gait and mobility: Secondary | ICD-10-CM | POA: Diagnosis not present

## 2022-07-30 DIAGNOSIS — M25561 Pain in right knee: Secondary | ICD-10-CM | POA: Diagnosis not present

## 2022-08-14 ENCOUNTER — Other Ambulatory Visit: Payer: Self-pay

## 2022-08-14 MED ORDER — LOSARTAN POTASSIUM 25 MG PO TABS: 25.0000 mg | ORAL_TABLET | Freq: Two times a day (BID) | ORAL | 3 refills | Status: DC

## 2022-08-20 DIAGNOSIS — R269 Unspecified abnormalities of gait and mobility: Secondary | ICD-10-CM | POA: Diagnosis not present

## 2022-08-20 DIAGNOSIS — M25561 Pain in right knee: Secondary | ICD-10-CM | POA: Diagnosis not present

## 2022-09-10 DIAGNOSIS — R269 Unspecified abnormalities of gait and mobility: Secondary | ICD-10-CM | POA: Diagnosis not present

## 2022-09-10 DIAGNOSIS — M25561 Pain in right knee: Secondary | ICD-10-CM | POA: Diagnosis not present

## 2022-09-17 DIAGNOSIS — M79674 Pain in right toe(s): Secondary | ICD-10-CM | POA: Diagnosis not present

## 2022-09-17 DIAGNOSIS — L237 Allergic contact dermatitis due to plants, except food: Secondary | ICD-10-CM | POA: Diagnosis not present

## 2022-09-17 DIAGNOSIS — I10 Essential (primary) hypertension: Secondary | ICD-10-CM | POA: Diagnosis not present

## 2022-09-18 ENCOUNTER — Ambulatory Visit
Admission: RE | Admit: 2022-09-18 | Discharge: 2022-09-18 | Disposition: A | Discharge status: Home / Self Care | Payer: Medicare Other | Source: Ambulatory Visit | Attending: Family Medicine | Admitting: Family Medicine

## 2022-09-18 ENCOUNTER — Other Ambulatory Visit: Payer: Self-pay | Admitting: Family Medicine

## 2022-09-18 DIAGNOSIS — M79674 Pain in right toe(s): Secondary | ICD-10-CM

## 2022-09-18 DIAGNOSIS — R197 Diarrhea, unspecified: Secondary | ICD-10-CM | POA: Diagnosis not present

## 2022-09-18 DIAGNOSIS — M19071 Primary osteoarthritis, right ankle and foot: Secondary | ICD-10-CM | POA: Diagnosis not present

## 2022-09-18 DIAGNOSIS — F319 Bipolar disorder, unspecified: Secondary | ICD-10-CM | POA: Diagnosis not present

## 2022-09-23 DIAGNOSIS — C50912 Malignant neoplasm of unspecified site of left female breast: Secondary | ICD-10-CM | POA: Diagnosis not present

## 2022-09-23 DIAGNOSIS — Z9011 Acquired absence of right breast and nipple: Secondary | ICD-10-CM | POA: Diagnosis not present

## 2022-09-24 DIAGNOSIS — M25561 Pain in right knee: Secondary | ICD-10-CM | POA: Diagnosis not present

## 2022-09-24 DIAGNOSIS — R269 Unspecified abnormalities of gait and mobility: Secondary | ICD-10-CM | POA: Diagnosis not present

## 2022-10-07 DIAGNOSIS — F329 Major depressive disorder, single episode, unspecified: Secondary | ICD-10-CM | POA: Diagnosis not present

## 2022-10-07 DIAGNOSIS — G4730 Sleep apnea, unspecified: Secondary | ICD-10-CM | POA: Diagnosis not present

## 2022-10-08 DIAGNOSIS — Z9011 Acquired absence of right breast and nipple: Secondary | ICD-10-CM | POA: Diagnosis not present

## 2022-10-08 DIAGNOSIS — C50912 Malignant neoplasm of unspecified site of left female breast: Secondary | ICD-10-CM | POA: Diagnosis not present

## 2022-10-11 DIAGNOSIS — G4733 Obstructive sleep apnea (adult) (pediatric): Secondary | ICD-10-CM | POA: Diagnosis not present

## 2022-10-11 DIAGNOSIS — Z853 Personal history of malignant neoplasm of breast: Secondary | ICD-10-CM | POA: Diagnosis not present

## 2022-10-11 DIAGNOSIS — D6869 Other thrombophilia: Secondary | ICD-10-CM | POA: Diagnosis not present

## 2022-10-15 DIAGNOSIS — C50912 Malignant neoplasm of unspecified site of left female breast: Secondary | ICD-10-CM | POA: Diagnosis not present

## 2022-10-17 ENCOUNTER — Ambulatory Visit: Payer: Medicare Other | Admitting: Podiatry

## 2022-10-17 DIAGNOSIS — M2022 Hallux rigidus, left foot: Secondary | ICD-10-CM | POA: Diagnosis not present

## 2022-10-17 DIAGNOSIS — M2042 Other hammer toe(s) (acquired), left foot: Secondary | ICD-10-CM | POA: Diagnosis not present

## 2022-10-17 DIAGNOSIS — M2021 Hallux rigidus, right foot: Secondary | ICD-10-CM | POA: Diagnosis not present

## 2022-10-17 DIAGNOSIS — M2041 Other hammer toe(s) (acquired), right foot: Secondary | ICD-10-CM

## 2022-10-22 DIAGNOSIS — R269 Unspecified abnormalities of gait and mobility: Secondary | ICD-10-CM | POA: Diagnosis not present

## 2022-10-22 DIAGNOSIS — M25561 Pain in right knee: Secondary | ICD-10-CM | POA: Diagnosis not present

## 2022-10-23 DIAGNOSIS — K8681 Exocrine pancreatic insufficiency: Secondary | ICD-10-CM | POA: Diagnosis not present

## 2022-10-23 DIAGNOSIS — R197 Diarrhea, unspecified: Secondary | ICD-10-CM | POA: Diagnosis not present

## 2022-10-23 NOTE — Progress Notes (Signed)
Subjective:   Patient ID: Ashley White, female   DOB: 73 y.o.   MRN: [id number]   HPI Chief Complaint  Patient presents with   Toe Pain    Patient came in today 2nd toe right foot, started 3 months ago, rate of pain 9  out of 10, X-Rays done 09/18/2022, callus on the left 2nd toe    72 year old female presents the office above concerns.  No recent injuries but she reports.  No open lesions.   Review of Systems  All other systems reviewed and are negative.  Past Medical History:  Diagnosis Date   ADD (attention deficit disorder)    Arrhythmia    Cancer ([facility]) 2001   left breast, treated with lumpectomy, radiation, and chemotherapy   Depression    Mitral valve prolapse    Myocarditis ([facility])    at age 58   Paroxysmal atrial fibrillation ([facility])    Sleep apnea    uses CPAP followed by Dr Earl Gala   Stroke [facility]) 2015    Past Surgical History:  Procedure Laterality Date   ATRIAL FIBRILLATION ABLATION N/A 06/20/2020   Procedure: ATRIAL FIBRILLATION ABLATION;  Surgeon: Hillis Range, MD;  [facility] INV[facility] LAB;  Service: Cardiovascular;  Laterality: N/A;   BREAST LUMPECTOMY  2001   malignancy removed   BREAST LUMPECTOMY WITH RADIOACTIVE SEED LOCALIZATION Right 08/09/2013   benign   BREAST SURGERY  1993   left cyst removed   BUBBLE STUDY  02/03/2020   Procedure: BUBBLE STUDY;  Surgeon: Parke Poisson, MD;  Location: [facility] ENDOSCOPY;  Service: Cardiovascular;;   CARDIOVERSION N/A 02/03/2020   Procedure: CARDIOVERSION;  Surgeon: Parke Poisson, MD;  Location: [facility] ENDOSCOPY;  Service: Cardiovascular;  Laterality: N/A;   CATARACT EXTRACTION     ELECTROPHYSIOLOGIC STUDY N/A 01/09/2016   Procedure: Atrial Fibrillation Ablation;  Surgeon: Hillis Range, MD;  Location: [facility] INVASIVE CV LAB;  Service: Cardiovascular;  Laterality: N/A;   OOPHORECTOMY Left    right knee tibia surgery     2006   TEE WITHOUT CARDIOVERSION N/A 02/03/2020   Procedure: TRANSESOPHAGEAL ECHOCARDIOGRAM (TEE);   Surgeon: Parke Poisson, MD;  Location: [facility] ENDOSCOPY;  Service: Cardiovascular;  Laterality: N/A;     Current Outpatient Medications:    apixaban (ELIQUIS) 5 MG TABS tablet, TAKE 1 TABLET(5 MG) BY MOUTH TWICE DAILY, Disp: 180 tablet, Rfl: 1   cetirizine (ZYRTEC) 10 MG tablet, Take 10 mg by mouth daily as needed for allergies., Disp: , Rfl:    diltiazem (CARDIZEM) 30 MG tablet, TAKE 1 TABLET BY MOUTH EVERY 4 HOURS AS NEEDED FOR HEART RATE GREATER THAN 100, Disp: 45 tablet, Rfl: 1   flecainide (TAMBOCOR) 100 MG tablet, TAKE 1 TABLET(100 MG) BY MOUTH TWICE DAILY, Disp: 180 tablet, Rfl: 3   fluticasone (CUTIVATE) 0.05 % cream, Apply 1 application topically daily as needed (itching). , Disp: , Rfl:    ketoconazole (NIZORAL) 2 % cream, Apply 1 application topically daily as needed (Skin irritation). , Disp: , Rfl:    lamoTRIgine (LAMICTAL) 200 MG tablet, Take 200 mg by mouth at bedtime. , Disp: , Rfl: 5   losartan (COZAAR) 25 MG tablet, Take 1 tablet (25 mg total) by mouth in the morning and at bedtime., Disp: 90 tablet, Rfl: 3   metoprolol succinate (TOPROL-XL) 50 MG 24 hr tablet, Take 1 tablet (50 mg total) by mouth daily. Take with or immediately following a meal., Disp: 90 tablet, Rfl: 3   modafinil (PROVIGIL) 100 MG  tablet, Take 150 mg by mouth daily before breakfast., Disp: , Rfl:    Omega-3 Fatty Acids (FISH OIL PO), Take 2,200 mg by mouth at bedtime., Disp: , Rfl:    rosuvastatin (CRESTOR) 20 MG tablet, TAKE 1 TABLET(20 MG) BY MOUTH DAILY, Disp: 90 tablet, Rfl: 3   vortioxetine HBr (TRINTELLIX) 20 MG TABS tablet, Take 20 mg by mouth in the morning., Disp: , Rfl:   Allergies  Allergen Reactions   Penicillin G Other (See Comments)    Childhood   Nickel Itching and Rash          Objective:  Physical Exam  General: AAO x3, NAD  Dermatological: Hyperkeratotic lesion right second toe without any underlying ulceration drainage or any signs of infection.  There are no open  lesions.  Vascular: Dorsalis Pedis artery and Posterior Tibial artery pedal pulses are 2/4 bilateral with immedate capillary fill time. There is no pain with calf compression, swelling, warmth, erythema.   Neruologic: Grossly intact via light touch bilateral.   Musculoskeletal: Decreased engine motion noted for some treatment.  There is crepitation with range of motion. There is deformity noted right second toe as well as about other lesser digits.     Assessment:   72 year old female with right second toe pain, digital forming, hallux rigidus     Plan:  -Treatment options discussed including all alternatives, risks, and complications -Etiology of symptoms were discussed -I have independently reviewed the x-rays from previous.  Digital palms noticeable arthritic changes present at the first MTPJ. -Sharp debridement hyperkeratotic lesion without any complication of bleeding.  Discussed with conservative as well as surgical treatment options.  Will start with conservative treatment.  Dispensed likely transfer discussed she modifications to include a wider toe box as well as a softer toe box.   Vivi Barrack DPM

## 2022-10-25 ENCOUNTER — Encounter: Payer: Self-pay | Admitting: Internal Medicine

## 2022-10-31 ENCOUNTER — Ambulatory Visit: Payer: Medicare Other | Admitting: Internal Medicine

## 2022-10-31 ENCOUNTER — Encounter: Payer: Self-pay | Admitting: Internal Medicine

## 2022-10-31 VITALS — BP 124/70 | HR 68 | Ht 65.0 in | Wt 133.0 lb

## 2022-10-31 DIAGNOSIS — R634 Abnormal weight loss: Secondary | ICD-10-CM | POA: Diagnosis not present

## 2022-10-31 DIAGNOSIS — R197 Diarrhea, unspecified: Secondary | ICD-10-CM

## 2022-10-31 DIAGNOSIS — K8689 Other specified diseases of pancreas: Secondary | ICD-10-CM | POA: Diagnosis not present

## 2022-10-31 NOTE — Progress Notes (Addendum)
HISTORY OF PRESENT ILLNESS:  Ashley White is a 71 y.o. female, with past medical history as listed below including atrial fibrillation status post prior ablation on chronic Eliquis therapy, who presents today regarding problems with chronic diarrhea and weight loss.  The patient was previously evaluated and treated by Dr. Willis Modena of [facility].  For personal reasons, she seeks care at this clinic at this time.  Unfortunately, she has no accompanying records.  By her own admission, she is a rambling historian.  Patient tells me that about 1 year ago she was traveling on a cruise ship in [place] in [place].  During that adventure she developed severe diarrhea with incontinence.  She treated with Lomotil.  Problem improved some with time, but remained a significant issue.  She lost about 20 to 25 pounds.  She was experiencing about 2 loose stools per day.  She was evaluated by Dr. Dulce Sellar.  Sounds like he did a number of studies and may have diagnosed pancreatic insufficiency.  She was placed on Zenpep 40,000 units of lipase (2 with meals).  She states this helped dramatically.  She developed more normal bowels.  More recently she has noticed somewhat looser bowels.  She has not questions regarding her condition and its treatment.  She is not known to have diabetes, IBD, pancreatic cancer, relevant GI surgery, or calcific pancreatitis.  Not clear if she has been screened for celiac disease.  The only blood work available is from January 25, 2022.  Unremarkable basic metabolic panel and lipid profile.  I also see a virtual colonoscopy report dated September 05, 2021.  This was said to be excellent quality study without polyps.  She was noted to have an incidental hiatal hernia and aortic atherosclerosis.  The pancreas was negative.  She denies significant alcohol use or family history of pancreatic disease.  REVIEW OF SYSTEMS:  All non-GI ROS reported as negative  Past Medical History:  Diagnosis Date    ADD (attention deficit disorder)    Arrhythmia    Cancer ([facility]) 2001   left breast, treated with lumpectomy, radiation, and chemotherapy   Depression    Mitral valve prolapse    Myocarditis ([facility])    at age 61   Paroxysmal atrial fibrillation ([facility])    Sleep apnea    uses CPAP followed by Dr Earl Gala   Stroke [facility]) 2015    Past Surgical History:  Procedure Laterality Date   ATRIAL FIBRILLATION ABLATION N/A 06/20/2020   Procedure: ATRIAL FIBRILLATION ABLATION;  Surgeon: Hillis Range, MD;  [facility] INVASIVE CV LAB;  Service: Cardiovascular;  Laterality: N/A;   BREAST LUMPECTOMY  2001   malignancy removed   BREAST LUMPECTOMY WITH RADIOACTIVE SEED LOCALIZATION Right 08/09/2013   benign   BREAST SURGERY  1993   left cyst removed   BUBBLE STUDY  02/03/2020   Procedure: BUBBLE STUDY;  Surgeon: Parke Poisson, MD;  Location: [facility] ENDOSCOPY;  Service: Cardiovascular;;   CARDIOVERSION N/A 02/03/2020   Procedure: CARDIOVERSION;  Surgeon: Parke Poisson, MD;  Location: [facility] ENDOSCOPY;  Service: Cardiovascular;  Laterality: N/A;   CATARACT EXTRACTION     ELECTROPHYSIOLOGIC STUDY N/A 01/09/2016   Procedure: Atrial Fibrillation Ablation;  Surgeon: Hillis Range, MD;  Location: [facility] INVASIVE CV LAB;  Service: Cardiovascular;  Laterality: N/A;   OOPHORECTOMY Left    right knee tibia surgery     2006   TEE WITHOUT CARDIOVERSION N/A 02/03/2020   Procedure: TRANSESOPHAGEAL ECHOCARDIOGRAM (TEE);  Surgeon: Parke Poisson,  MD;  Location: [facility] ENDOSCOPY;  Service: Cardiovascular;  Laterality: N/A;    Social History Hisaye Keding Hudlow  reports that she has never smoked. She has never used smokeless tobacco. She reports current alcohol use of about 1.0 standard drink of alcohol per week. She reports that she does not use drugs.  family history includes Breast cancer in her paternal grandmother; Colon cancer in her father; Heart disease in her mother.  Allergies  Allergen Reactions   Penicillin G Other (See  Comments)    Childhood   Nickel Itching and Rash       PHYSICAL EXAMINATION: Vital signs: BP 124/70   Pulse 68   Ht 5\' 5"  (1.651 m)   Wt 133 lb (60.3 kg)   BMI 22.13 kg/m   Constitutional: generally well-appearing, no acute distress Psychiatric: alert and oriented x3, cooperative Eyes: extraocular movements intact, anicteric, conjunctiva pink Mouth: oral pharynx moist, no lesions Neck: supple no lymphadenopathy Cardiovascular: heart irregular rate and rhythm, no murmur Lungs: clear to auscultation bilaterally Abdomen: soft, nontender, nondistended, no obvious ascites, no peritoneal signs, normal bowel sounds, no organomegaly Rectal: Omitted Extremities: no clubbing, cyanosis, or lower extremity edema bilaterally Skin: no lesions on visible extremities Neuro: No focal deficits.  Cranial nerves intact  ASSESSMENT:  1.  Chronic diarrhea.  Improved after pancreatic enzyme supplementation.  Query pancreatic insufficiency 2.  Onset of diarrhea was abrupt while traveling.  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 3.  History of atrial fibrillation on chronic anticoagulation 4.  Negative virtual colonoscopy June 2023 5.  Prior GI workup unknown   PLAN:  1.  Advised patient to continue with pancreatic enzyme supplementation.  She could increase the dose if needed. 2.  We have solicited for all outside records for review 3.  I would like her to follow-up in this clinic in 6 weeks after records review completed, and see how she is doing clinically. Total time 45 minutes was spent.  See the patient, obtaining comprehensive history, performing medically appropriate physical exam, counseling educating patient regarding above listed issues, soliciting outside records, and documenting clinical information in the health record  ADDENDUM: November 26, 2022  October 23, 2022.  Office visit with PCP, Dr. Wynelle Link.  Highlights from that visit stated that the patient wanted a second opinion with an  [facility].  Due to that request she was apparently discharged from their practice and was referred to this practice.  September 18, 2022.  Office visit with PCP, Dr. Wynelle Link.  The patient thought that she had a gout flare induced by pancreatic enzyme supplementation.  She was concerned over medication cost.  PCP did not feel she had a gout.  Blood work from that day revealed a normal comprehensive metabolic panel including liver tests, protein, and albumin (4.4).  September 17, 2022.  Office visit with B.  Jackquline Bosch, NP.  Complained of toe pain and poison ivy.  Normal comprehensive metabolic panel.  Normal CBC with hemoglobin 13.8.  July 25, 2022.  GI OFFICE VISIT with Dr. Dulce Sellar.  This is his first encounter with the patient.  Apparently had negative GI pathogen panel and Hemoccult negative stool.  Said to have had colonoscopy in 2008 which was negative including random colon biopsies.  Reports negative virtual colonoscopy in 2023.  May 21, 2022.  Office visit with PCP, Dr. Wynelle Link.  No relevant information.  April 09, 2022.  Office visit with PCP Dr. Wynelle Link.  Complains of diarrhea  June 26, 2021.  Seen by GI  physician assistant [place].  Elected for virtual colonoscopy.  Wilhemina Bonito. Eda Keys., M.D. [facility] Division of Gastroenterology

## 2022-10-31 NOTE — Patient Instructions (Signed)
_______________________________________________________  If your blood pressure at your visit was 140/90 or greater, please contact your primary care physician to follow up on this.  _______________________________________________________  If you are age 71 or older, your body mass index should be between 23-30. Your Body mass index is 22.13 kg/m. If this is out of the aforementioned range listed, please consider follow up with your Primary Care Provider.  If you are age 56 or younger, your body mass index should be between 19-25. Your Body mass index is 22.13 kg/m. If this is out of the aformentioned range listed, please consider follow up with your Primary Care Provider.   ________________________________________________________  The [facility] providers would like to encourage you to use [facility] to communicate with providers for non-urgent requests or questions.  Due to long hold times on the telephone, sending your provider a message by [facility] may be a faster and more efficient way to get a response.  Please allow 48 business hours for a response.  Please remember that this is for non-urgent requests.  _______________________________________________________

## 2022-11-05 DIAGNOSIS — R269 Unspecified abnormalities of gait and mobility: Secondary | ICD-10-CM | POA: Diagnosis not present

## 2022-11-05 DIAGNOSIS — M25561 Pain in right knee: Secondary | ICD-10-CM | POA: Diagnosis not present

## 2022-11-06 ENCOUNTER — Other Ambulatory Visit: Payer: Self-pay | Admitting: Cardiovascular Disease

## 2022-11-06 DIAGNOSIS — I480 Paroxysmal atrial fibrillation: Secondary | ICD-10-CM

## 2022-11-06 NOTE — Telephone Encounter (Signed)
Prescription refill request for Eliquis received. Indication: Afib  Last office visit: 05/29/22 ([place])  Scr: 0.82 (01/25/22)  Age: 72 Weight: 60.3kg  Appropriate dose. Refill sent.

## 2022-11-06 NOTE — Telephone Encounter (Signed)
Please review eliquis refill

## 2022-11-11 DIAGNOSIS — G4733 Obstructive sleep apnea (adult) (pediatric): Secondary | ICD-10-CM | POA: Diagnosis not present

## 2022-11-19 ENCOUNTER — Telehealth: Payer: Self-pay

## 2022-11-19 NOTE — Telephone Encounter (Signed)
Spoke to patient to let her know I was having difficulty obtaining her records from [facility].  She instructed me to fax the release to her PCP instead - she states they have all her records.  I faxed the release to Dr. Chase Caller office.  I will call her back if this is not successful.

## 2022-11-22 NOTE — Telephone Encounter (Signed)
Received records from [facility] but they did not include any procedures.  LM with the records department that I needed procedures as well

## 2022-11-25 NOTE — Telephone Encounter (Signed)
Received virtual colonoscopy, placed on Dr. Lamar Sprinkles desk

## 2022-12-12 DIAGNOSIS — G4733 Obstructive sleep apnea (adult) (pediatric): Secondary | ICD-10-CM | POA: Diagnosis not present

## 2022-12-17 ENCOUNTER — Ambulatory Visit: Payer: Medicare Other | Admitting: Internal Medicine

## 2023-01-01 ENCOUNTER — Encounter: Payer: Self-pay | Admitting: Internal Medicine

## 2023-01-01 ENCOUNTER — Other Ambulatory Visit: Payer: Medicare Other

## 2023-01-01 ENCOUNTER — Ambulatory Visit: Payer: Medicare Other | Admitting: Internal Medicine

## 2023-01-01 VITALS — BP 122/64 | HR 53 | Ht 65.0 in | Wt 134.0 lb

## 2023-01-01 DIAGNOSIS — R634 Abnormal weight loss: Secondary | ICD-10-CM

## 2023-01-01 DIAGNOSIS — R197 Diarrhea, unspecified: Secondary | ICD-10-CM | POA: Diagnosis not present

## 2023-01-01 DIAGNOSIS — K8689 Other specified diseases of pancreas: Secondary | ICD-10-CM

## 2023-01-01 NOTE — Progress Notes (Signed)
HISTORY OF PRESENT ILLNESS:  Ashley White is a 72 y.o. 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  See that dictation for details.  Since her last visit we were able to obtain outside records for review.  Those are documented in an addendum on her last office visit.  Blood work from June looks good including albumin of 4.4.  Other blood work normal including CBC with hemoglobin 13.8.  Patient tells me that she continues on Zenpep 80,000 units total with meals.  Currently reports 1 or 2 soft bowel movements per day.  No diarrhea.  She has no identifiable risk factors for pancreatic insufficiency.  However, she has not been tested for celiac disease, it appears.  She does have a number of questions but no new complaints.  Feeling well.  REVIEW OF SYSTEMS:  All non-GI ROS negative. Past Medical History:  Diagnosis Date   ADD (attention deficit disorder)    Arrhythmia    Cancer (HCC) 2001   left breast, treated with lumpectomy, radiation, and chemotherapy   Depression    Mitral valve prolapse    Myocarditis (HCC)    at age 51   Paroxysmal atrial fibrillation (HCC)    Sleep apnea    uses CPAP followed by Dr Earl Gala   Stroke [facility]) 2015    Past Surgical History:  Procedure Laterality Date   ATRIAL FIBRILLATION ABLATION N/A 06/20/2020   Procedure: ATRIAL FIBRILLATION ABLATION;  Surgeon: Hillis Range, MD;  Location: [facility] INVASIVE CV LAB;  Service: Cardiovascular;  Laterality: N/A;   BREAST LUMPECTOMY  2001   malignancy removed   BREAST LUMPECTOMY WITH RADIOACTIVE SEED LOCALIZATION Right 08/09/2013   benign   BREAST SURGERY  1993   left cyst removed   BUBBLE STUDY  02/03/2020   Procedure: BUBBLE STUDY;  Surgeon: Parke Poisson, MD;  Location: [facility] ENDOSCOPY;  Service: Cardiovascular;;   CARDIOVERSION N/A 02/03/2020   Procedure: CARDIOVERSION;  Surgeon: Parke Poisson, MD;  Location: [facility] ENDOSCOPY;  Service: Cardiovascular;  Laterality: N/A;   CATARACT EXTRACTION     ELECTROPHYSIOLOGIC STUDY N/A 01/09/2016   Procedure: Atrial Fibrillation Ablation;  Surgeon: Hillis Range, MD;  Location: [facility] INVASIVE CV LAB;  Service: Cardiovascular;  Laterality: N/A;   OOPHORECTOMY Left    right knee tibia surgery     2006   TEE WITHOUT CARDIOVERSION N/A 02/03/2020   Procedure: TRANSESOPHAGEAL ECHOCARDIOGRAM (TEE);  Surgeon: Parke Poisson, MD;  Location: [facility] ENDOSCOPY;  Service: Cardiovascular;  Laterality: N/A;    Social History Quentin Angst Gutt  reports that she has never smoked. She has never used smokeless tobacco. She reports current alcohol use of about 1.0 standard drink of alcohol per week. She reports that she does not use drugs.  family history includes Breast cancer in her paternal grandmother; Colon cancer in her father; Heart disease in her mother.  Allergies  Allergen Reactions   Penicillin G Other (See Comments)    Childhood   Nickel Itching and Rash       PHYSICAL EXAMINATION: Vital signs: BP 122/64   Pulse (!) 53   Ht 5\' 5"  (1.651 m)   Wt 134 lb (60.8 kg)   BMI 22.30 kg/m  General: Well-developed, well-nourished, no acute distress HEENT: Sclerae are anicteric, conjunctiva pink. Oral mucosa intact Lungs: Clear Heart: Regular Abdomen: soft, nontender, nondistended, no obvious ascites, no peritoneal signs, normal  bowel sounds. No organomegaly. Extremities: No edema Psychiatric: alert and oriented x3. Cooperative   ASSESSMENT:  1.  Chronic diarrhea.  Seemingly improved after pancreatic enzyme supplementation.  Possible etiologies include pancreatic insufficiency, postinfectious IBS exacerbating chronic IBS. 2.  Prior colonoscopy with biopsies negative. 3.  Virtual colonoscopy June 2023 was negative   PLAN:  1.  Screen for celiac disease with tissue transglutaminase antibody IgA and serum IgA level 2.  Continue Zenpep with  meals 3.  Routine office follow-up 6 months A total time of 30 minutes was spent preparing to see the patient, obtaining interval history, performing medically appropriate physical examination, counseling educating patient regarding above listed issues, answering all questions, directing medical therapy, ordering blood work, and documenting clinical information in the health record

## 2023-01-01 NOTE — Patient Instructions (Signed)
Your provider has requested that you go to the basement level for lab work before leaving today. Press "B" on the elevator. The lab is located at the first door on the left as you exit the elevator.  Please follow up in 6 months.  _______________________________________________________  If your blood pressure at your visit was 140/90 or greater, please contact your primary care physician to follow up on this.  _______________________________________________________  If you are age 22 or older, your body mass index should be between 23-30. Your Body mass index is 22.3 kg/m. If this is out of the aforementioned range listed, please consider follow up with your Primary Care Provider.  If you are age 67 or younger, your body mass index should be between 19-25. Your Body mass index is 22.3 kg/m. If this is out of the aformentioned range listed, please consider follow up with your Primary Care Provider.   ________________________________________________________  The [facility] providers would like to encourage you to use [facility] to communicate with providers for non-urgent requests or questions.  Due to long hold times on the telephone, sending your provider a message by [facility] may be a faster and more efficient way to get a response.  Please allow 48 business hours for a response.  Please remember that this is for non-urgent requests.  _______________________________________________________

## 2023-01-02 LAB — TISSUE TRANSGLUTAMINASE, IGA: (tTG) Ab, IgA: <1 U/mL

## 2023-01-02 LAB — IGA: Immunoglobulin A: 282 mg/dL (ref 70–320)

## 2023-01-15 DIAGNOSIS — F319 Bipolar disorder, unspecified: Secondary | ICD-10-CM | POA: Diagnosis not present

## 2023-01-16 DIAGNOSIS — G4733 Obstructive sleep apnea (adult) (pediatric): Secondary | ICD-10-CM | POA: Diagnosis not present

## 2023-01-22 ENCOUNTER — Ambulatory Visit: Payer: Medicare Other | Admitting: Internal Medicine

## 2023-02-11 ENCOUNTER — Telehealth: Payer: Self-pay | Admitting: Cardiovascular Disease

## 2023-02-11 DIAGNOSIS — I480 Paroxysmal atrial fibrillation: Secondary | ICD-10-CM

## 2023-02-11 MED ORDER — APIXABAN 5 MG PO TABS: 5.0000 mg | ORAL_TABLET | Freq: Two times a day (BID) | ORAL | 1 refills | Status: DC

## 2023-02-11 NOTE — Telephone Encounter (Signed)
*  STAT* If patient is at the pharmacy, call can be transferred to refill team.   1. Which medications need to be refilled? (please list name of each medication and dose if known)   ELIQUIS 5 MG TABS tablet   2. Would you like to learn more about the convenience, safety, & potential cost savings by using the [facility]?   3. Are you open to using the [facility] (Type [facility]. ).  4. Which pharmacy/location (including street and city if local pharmacy) is medication to be sent to?  [id number] - [place], [place] - [address] AT [facility] OF [address]   5. Do they need a 30 day or 90 day supply?   90 day  Patient stated she only has 2 days left of this medication.  Patient has appointment scheduled on  05/30/23.

## 2023-02-11 NOTE — Telephone Encounter (Signed)
Prescription refill request for Eliquis received. Indication: a fib Last office visit: 05/29/22 Scr: 0.74 09/17/22 care everywhere Age: 72 Weight: 60kg

## 2023-02-12 DIAGNOSIS — G4730 Sleep apnea, unspecified: Secondary | ICD-10-CM | POA: Diagnosis not present

## 2023-02-12 DIAGNOSIS — F329 Major depressive disorder, single episode, unspecified: Secondary | ICD-10-CM | POA: Diagnosis not present

## 2023-02-24 ENCOUNTER — Other Ambulatory Visit: Payer: Self-pay | Admitting: Cardiovascular Disease

## 2023-04-15 ENCOUNTER — Other Ambulatory Visit: Payer: Self-pay | Admitting: Cardiovascular Disease

## 2023-04-16 ENCOUNTER — Other Ambulatory Visit: Payer: Self-pay

## 2023-04-16 MED ORDER — FLECAINIDE ACETATE 100 MG PO TABS: 100.0000 mg | ORAL_TABLET | Freq: Two times a day (BID) | ORAL | 0 refills | Status: DC

## 2023-05-07 DIAGNOSIS — G4730 Sleep apnea, unspecified: Secondary | ICD-10-CM | POA: Diagnosis not present

## 2023-05-07 DIAGNOSIS — F329 Major depressive disorder, single episode, unspecified: Secondary | ICD-10-CM | POA: Diagnosis not present

## 2023-05-09 DIAGNOSIS — G4733 Obstructive sleep apnea (adult) (pediatric): Secondary | ICD-10-CM | POA: Diagnosis not present

## 2023-05-28 ENCOUNTER — Other Ambulatory Visit: Payer: Self-pay | Admitting: Cardiovascular Disease

## 2023-05-29 ENCOUNTER — Other Ambulatory Visit: Payer: Self-pay | Admitting: Cardiovascular Disease

## 2023-05-29 MED ORDER — LOSARTAN POTASSIUM 25 MG PO TABS: 25.0000 mg | ORAL_TABLET | Freq: Every day | ORAL | 1 refills | Status: DC

## 2023-05-30 ENCOUNTER — Ambulatory Visit: Payer: Medicare Other | Attending: Physician Assistant | Admitting: Physician Assistant

## 2023-05-30 ENCOUNTER — Encounter: Payer: Self-pay | Admitting: Physician Assistant

## 2023-05-30 VITALS — BP 136/84 | HR 60 | Ht 65.0 in | Wt 130.4 lb

## 2023-05-30 DIAGNOSIS — I10 Essential (primary) hypertension: Secondary | ICD-10-CM

## 2023-05-30 DIAGNOSIS — I4819 Other persistent atrial fibrillation: Secondary | ICD-10-CM | POA: Diagnosis not present

## 2023-05-30 DIAGNOSIS — E782 Mixed hyperlipidemia: Secondary | ICD-10-CM

## 2023-05-30 NOTE — Patient Instructions (Signed)
 Medication Instructions:  NO CHANGES *If you need a refill on your cardiac medications before your next appointment, please call your pharmacy*   Lab Work: NO LABS If you have labs (blood work) drawn today and your tests are completely normal, you will receive your results only by: MyChart Message (if you have MyChart) OR A paper copy in the mail If you have any lab test that is abnormal or we need to change your treatment, we will call you to review the results.   Testing/Procedures: NO TESTING   Follow-Up: At [facility], you and your health needs are our priority.  As part of our continuing mission to provide you with exceptional heart care, we have created designated Provider Care Teams.  These Care Teams include your primary Cardiologist (physician) and Advanced Practice Providers (APPs -  Physician Assistants and Nurse Practitioners) who all work together to provide you with the care you need, when you need it.   Your next appointment:   1 year(s)  Provider:   Reatha Harps, MD   Other Instructions

## 2023-05-30 NOTE — Progress Notes (Signed)
 Cardiology Office Note:  .   Date:  05/30/2023  ID:  Ashley White, DOB Jun 17, 1950, MRN [id number] PCP: Ashley James, MD  [facility] Providers Cardiologist:  Ashley Harps, MD Cardiology APP:  Ashley Duster, PA     History of Present Illness: .   Ashley White is a 73 y.o. female with PMH of persistent atrial fibrillation s/p ablation x 2 on flecainide, hypertension, hyperlipidemia, Ehlers-Danlos, history of CVA, and OSA on CPAP.  After her last ablation, she has not had any recurrence of atrial fibrillation.  Previously she tried to come off of the flecainide, however symptom of palpitation recurred.  Cardiac CT obtained on 06/13/2020 showed coronary calcium score of 0, no CAD in the proximal segments, no thrombus seen in the left atrial appendage, normal pulmonary vein drainage in 2 left atrium, right lower lobe 5 mm pulmonary nodule stable when compared to 2017.  Echocardiogram obtained on 06/23/2022 showed EF 60 to 65%, grade 2 DD, normal RV, mild LAE, mild to moderate MR.  She was last seen by Dr. Flora White in February 2024 at which time she was doing well patient presents today for follow-up.  Patient presents today for follow-up.  She has not had any recurrence of A-fib in the past year.  She denies any chest pain or shortness of breath.  She has no lower extremity edema, orthopnea or PND.  Overall she is doing very well.  She is however interested in considering Watchman device to further decrease her stroke risk.  I will message Dr. Flora White to see if she may be a good candidate to refer to Dr. Lalla White.  Otherwise, she can follow-up in 12 months.  ROS:   She denies chest pain, palpitations, dyspnea, pnd, orthopnea, n, v, dizziness, syncope, edema, weight gain, or early satiety. All other systems reviewed and are otherwise negative except as noted above.    Studies Reviewed: [name]   EKG Interpretation Date/Time:  Friday May 30 2023 10:49:11 EST Ventricular Rate:  60 PR  Interval:  202 QRS Duration:  96 QT Interval:  454 QTC Calculation: 454 R Axis:   88  Text Interpretation: Normal sinus rhythm Minimal voltage criteria for LVH, may be normal variant ( Sokolow-Lyon ) When compared with ECG of 19-Jul-2020 10:42, No significant change was found Confirmed by Ashley White [phone]) on 05/30/2023 11:24:30 AM    Cardiac Studies & Procedures   ______________________________________________________________________________________________     ECHOCARDIOGRAM  ECHOCARDIOGRAM COMPLETE 06/22/2020  Narrative ECHOCARDIOGRAM REPORT    Patient Name:   Ashley White   Date of Exam: 06/22/2020 Medical Rec #:  [id number]     Height:       64.0 in Accession #:    [id number]    Weight:       160.0 lb Date of Birth:  1950/09/08     BSA:          1.779 m Patient Age:    69 years      BP:           140/68 mmHg Patient Gender: F             HR:           61 bpm. Exam Location:  [address]  Procedure: 2D Echo, Cardiac Doppler, Color Doppler and Strain Analysis  Indications:    I48.91 Atrial Fibrillation  History:        Patient has prior history of Echocardiogram examinations, most recent 02/03/2020. Stroke,  Arrythmias:Atrial Fibrillation and Atrial Flutter; Risk Factors:Sleep Apnea and Family History of Coronary Artery Disease. Left Breast Cancer with Lumpectomy, Radiation and Chemotherapy Multiple Cardioversion and Ablation.  Sonographer:    Farrel Conners RDCS Referring Phys: [id number] White ALLRED  IMPRESSIONS   1. Left ventricular ejection fraction, by estimation, is 60 to 65%. The left ventricle has normal function. The left ventricle has no regional wall motion abnormalities. Left ventricular diastolic parameters are consistent with Grade II diastolic dysfunction (pseudonormalization). Elevated left atrial pressure. The average left ventricular global longitudinal strain is 21.2 %. The global longitudinal strain is normal. 2. Right ventricular systolic function is  normal. The right ventricular size is normal. There is normal pulmonary artery systolic pressure. 3. Left atrial size was mildly dilated. 4. The mitral valve is normal in structure. Mild to moderate mitral valve regurgitation. No evidence of mitral stenosis. 5. The aortic valve is normal in structure. Aortic valve regurgitation is not visualized. Mild aortic valve sclerosis is present, with no evidence of aortic valve stenosis. 6. The inferior vena cava is normal in size with greater than 50% respiratory variability, suggesting right atrial pressure of 3 mmHg. 7. Evidence of atrial level shunting detected by color flow Doppler. There is a very small secundum atrial septal defect with left to right shunting across the atrial septum (consistent with post-transseptal puncture defect).  FINDINGS Left Ventricle: Left ventricular ejection fraction, by estimation, is 60 to 65%. The left ventricle has normal function. The left ventricle has no regional wall motion abnormalities. The average left ventricular global longitudinal strain is 21.2 %. The global longitudinal strain is normal. The left ventricular internal cavity size was normal in size. There is no left ventricular hypertrophy. Left ventricular diastolic parameters are consistent with Grade II diastolic dysfunction (pseudonormalization). Elevated left atrial pressure.  Right Ventricle: The right ventricular size is normal. No increase in right ventricular wall thickness. Right ventricular systolic function is normal. There is normal pulmonary artery systolic pressure. The tricuspid regurgitant velocity is 2.53 m/s, and with an assumed right atrial pressure of 3 mmHg, the estimated right ventricular systolic pressure is 28.6 mmHg.  Left Atrium: Left atrial size was mildly dilated.  Right Atrium: Right atrial size was normal in size.  Pericardium: There is no evidence of pericardial effusion.  Mitral Valve: The mitral valve is normal in  structure. Mild to moderate mitral valve regurgitation, with centrally-directed jet. No evidence of mitral valve stenosis. MV peak gradient, 8.6 mmHg. The mean mitral valve gradient is 2.0 mmHg.  Tricuspid Valve: The tricuspid valve is normal in structure. Tricuspid valve regurgitation is mild . No evidence of tricuspid stenosis.  Aortic Valve: The aortic valve is normal in structure. Aortic valve regurgitation is not visualized. Mild aortic valve sclerosis is present, with no evidence of aortic valve stenosis.  Pulmonic Valve: The pulmonic valve was normal in structure. Pulmonic valve regurgitation is trivial. No evidence of pulmonic stenosis.  Aorta: The aortic root is normal in size and structure.  Venous: The inferior vena cava is normal in size with greater than 50% respiratory variability, suggesting right atrial pressure of 3 mmHg.  IAS/Shunts: Evidence of atrial level shunting detected by color flow Doppler. There is a small secundum atrial septal defect with predominantly left to right shunting across the atrial septum.   LEFT VENTRICLE PLAX 2D LVIDd:         4.30 cm  Diastology LVIDs:         2.10 cm  LV e' medial:  5.87 cm/s LV PW:         1.00 cm  LV E/e' medial:  22.1 LV IVS:        0.60 cm  LV e' lateral:   8.05 cm/s LVOT diam:     1.95 cm  LV E/e' lateral: 16.1 LV SV:         84 LV SV Index:   47       2D Longitudinal Strain LVOT Area:     2.99 cm 2D Strain GLS (A2C):   16.1 % 2D Strain GLS (A3C):   24.9 % 2D Strain GLS (A4C):   22.8 % 2D Strain GLS Avg:     21.2 %  RIGHT VENTRICLE RV S prime:     14.50 cm/s TAPSE (M-mode): 2.4 cm  LEFT ATRIUM             Index       RIGHT ATRIUM           Index LA diam:        3.70 cm 2.08 cm/m  RA Area:     15.70 cm LA Vol (A2C):   48.1 ml 27.03 ml/m RA Volume:   39.20 ml  22.03 ml/m LA Vol (A4C):   68.0 ml 38.22 ml/m LA Biplane Vol: 60.8 ml 34.17 ml/m AORTIC VALVE LVOT Vmax:   117.00 cm/s LVOT Vmean:  75.900  cm/s LVOT VTI:    0.282 m  AORTA Ao Root diam: 2.90 cm Ao Asc diam:  2.90 cm  MITRAL VALVE                TRICUSPID VALVE MV Area (PHT): cm          TR Peak grad:   25.6 mmHg MV Area VTI:   2.08 cm     TR Vmax:        253.00 cm/s MV Peak grad:  8.6 mmHg MV Mean grad:  2.0 mmHg     SHUNTS MV Vmax:       1.47 m/s     Systemic VTI:  0.28 m MV Vmean:      64.6 cm/s    Systemic Diam: 1.95 cm MV Decel Time: 267 msec MR Peak grad:   127.7 mmHg MR Mean grad:   86.0 mmHg MR Vmax:        565.00 cm/s MR Vmean:       439.0 cm/s MR PISA:        3.08 cm MR PISA Radius: 0.70 cm MV E velocity: 130.00 cm/s MV A velocity: 53.10 cm/s MV E/A ratio:  2.45  Mihai Croitoru MD Electronically signed by Thurmon Fair MD Signature Date/Time: 06/22/2020/2:46:03 PM    Final   TEE  ECHO TEE 02/03/2020  Narrative TRANSESOPHOGEAL ECHO REPORT    Patient Name:   REA KALAMA Date of Exam: 02/03/2020 Medical Rec #:  [id number]   Height:       66.0 in Accession #:    [id number]  Weight:       164.8 lb Date of Birth:  [birth year]   BSA:          1.842 m Patient Age:    68 years    BP:           142/99 mmHg Patient Gender: F           HR:           104 bpm. Exam Location:  Outpatient  Procedure: Transesophageal Echo, Cardiac Doppler,  Color Doppler and Saline Contrast Bubble Study  Indications:     I48.92* Unspecified atrial flutter  History:         Patient has prior history of Echocardiogram examinations, most recent 01/09/2016. Stroke, Mitral Valve Prolapse, Arrythmias:Atrial Fibrillation; Risk Factors:Sleep Apnea. Myocarditis.  Sonographer:     Elmarie Shiley Dance Referring Phys:  [phone] DONNA C CARROLL Diagnosing Phys: Weston Brass MD  PROCEDURE: TEE procedure time was 26 minutes. The transesophogeal probe was passed without difficulty through the esophogus of the patient. Imaged were obtained with the patient in a left lateral decubitus position. Local oropharyngeal anesthetic was provided  with Cetacaine. Sedation performed by different physician. The patient was monitored while under deep sedation. Anesthestetic sedation was provided intravenously by Anesthesiology: 602.22mg  of Propofol. Image quality was good. The patient's vital signs; including heart rate, blood pressure, and oxygen saturation; remained stable throughout the procedure. The patient developed no complications during the procedure. A successful direct current cardioversion was performed at 120 joules with 1 attempt.  IMPRESSIONS   1. Left ventricular ejection fraction, by estimation, is 60 to 65%. The left ventricle has normal function. 2. Right ventricular systolic function is normal. The right ventricular size is normal. There is normal pulmonary artery systolic pressure. 3. Left atrial size was moderately dilated. No left atrial/left atrial appendage thrombus was detected. The LAA emptying velocity was 51 cm/s. 4. Right atrial size was mildly dilated. 5. The mitral valve is mildly degenerative. Moderate mitral valve regurgitation. 6. Tricuspid valve regurgitation is mild to moderate. 7. The aortic valve is normal in structure. Aortic valve regurgitation is not visualized. No aortic stenosis is present. 8. Indeterminate bubble study due to suboptimal injection. No definite atrial level shunt.  Conclusion(s)/Recommendation(s): No LA/LAA thrombus identified. Successful cardioversion performed with restoration of normal sinus rhythm.  FINDINGS Left Ventricle: Left ventricular ejection fraction, by estimation, is 60 to 65%. The left ventricle has normal function. The left ventricular internal cavity size was normal in size.  Right Ventricle: The right ventricular size is normal. No increase in right ventricular wall thickness. Right ventricular systolic function is normal. There is normal pulmonary artery systolic pressure. The tricuspid regurgitant velocity is 2.23 m/s, and with an assumed right atrial pressure  of 8 mmHg, the estimated right ventricular systolic pressure is 27.9 mmHg.  Left Atrium: Left atrial size was moderately dilated. No left atrial/left atrial appendage thrombus was detected. The LAA emptying velocity was 51 cm/s.  Right Atrium: Right atrial size was mildly dilated.  Pericardium: There is no evidence of pericardial effusion.  Mitral Valve: Multiple jets of MR, appears commisural. ERO 0.2 cm2, RV 28 mL calculated by two PISA method with calculated PISA radius of 0.64 cm. The mitral valve is degenerative in appearance. Moderate mitral valve regurgitation.  Tricuspid Valve: The tricuspid valve is normal in structure. Tricuspid valve regurgitation is mild to moderate.  Aortic Valve: The aortic valve is normal in structure. Aortic valve regurgitation is not visualized. No aortic stenosis is present.  Pulmonic Valve: The pulmonic valve was normal in structure. Pulmonic valve regurgitation is mild.  Aorta: No aortic atherosclerosis noted. The aortic root and ascending aorta are structurally normal, with no evidence of dilitation.  IAS/Shunts: No atrial level shunt detected by color flow Doppler. Agitated saline contrast was given intravenously to evaluate for intracardiac shunting. Indeterminate bubble study due to suboptimal injection.   LEFT VENTRICLE PLAX 2D LVOT diam:     1.80 cm LV SV:  44 LV SV Index:   24 LVOT Area:     2.54 cm   AORTIC VALVE LVOT Vmax:   84.33 cm/s LVOT Vmean:  60.433 cm/s LVOT VTI:    0.174 m  AORTA Ao Root diam: 2.70 cm Ao Asc diam:  2.80 cm  MR Peak grad:    65.6 mmHg   TRICUSPID VALVE MR Mean grad:    44.0 mmHg   TR Peak grad:   19.9 mmHg MR Vmax:         405.00 cm/s TR Vmax:        223.00 cm/s MR Vmean:        321.0 cm/s MR PISA:         2.26 cm    SHUNTS MR PISA Eff ROA: 22 mm      Systemic VTI:  0.17 m MR PISA Radius:  0.60 cm     Systemic Diam: 1.80 cm  Weston Brass MD Electronically signed by Weston Brass  MD Signature Date/Time: 02/03/2020/11:51:20 AM    Final  MONITORS  CARDIAC EVENT MONITOR 11/25/2013       ______________________________________________________________________________________________      Risk Assessment/Calculations:    CHA2DS2-VASc Score = 5   This indicates a 7.2% annual risk of stroke. The patient's score is based upon: CHF History: 0 HTN History: 1 Diabetes History: 0 Stroke History: 2 Vascular Disease History: 0 Age Score: 1 Gender Score: 1            Physical Exam:   VS:  BP 136/84 (BP Location: Right Arm, Patient Position: Sitting, Cuff Size: Normal)   Pulse 60   Ht 5\' 5"  (1.651 m)   Wt 130 lb 6.4 oz (59.1 kg)   SpO2 100%   BMI 21.70 kg/m    Wt Readings from Last 3 Encounters:  05/30/23 130 lb 6.4 oz (59.1 kg)  01/01/23 134 lb (60.8 kg)  10/31/22 133 lb (60.3 kg)    GEN: Well nourished, well developed in no acute distress NECK: No JVD; No carotid bruits CARDIAC: RRR, no murmurs, rubs, gallops RESPIRATORY:  Clear to auscultation without rales, wheezing or rhonchi  ABDOMEN: Soft, non-tender, non-distended EXTREMITIES:  No edema; No deformity   ASSESSMENT AND PLAN: .     Atrial Fibrillation s/p ablation x2 No recurrence of atrial fibrillation since last ablation. Palpitations recur when attempting to discontinue Flecainide. Maintaining normal sinus rhythm. -Continue Flecainide and Eliquis. -Patient requested more information regarding Watchman device and is interested to consider the procedure.  Will discuss with Dr. Flora White to review if she is a candidate, potential referral to Dr. Lalla White.  Hypertension: On metoprolol succinate.  Blood pressure well-controlled  Hyperlipidemia: On rosuvastatin       Dispo: Follow-up in 12 months  Signed, Ashley White, [place]

## 2023-06-02 DIAGNOSIS — M8588 Other specified disorders of bone density and structure, other site: Secondary | ICD-10-CM | POA: Diagnosis not present

## 2023-06-02 DIAGNOSIS — Z0000 Encounter for general adult medical examination without abnormal findings: Secondary | ICD-10-CM | POA: Diagnosis not present

## 2023-06-02 DIAGNOSIS — E785 Hyperlipidemia, unspecified: Secondary | ICD-10-CM | POA: Diagnosis not present

## 2023-06-03 ENCOUNTER — Telehealth: Payer: Self-pay | Admitting: Physician Assistant

## 2023-06-03 NOTE — Telephone Encounter (Signed)
 Patient was interested in consideration of Watchman device.  I reached out to Dr. Flora Lipps who felt she is a reasonable candidate and he should be evaluated by Dr. Lalla Brothers.  We will make referral to Dr. Lalla Brothers.

## 2023-06-04 DIAGNOSIS — Z1231 Encounter for screening mammogram for malignant neoplasm of breast: Secondary | ICD-10-CM | POA: Diagnosis not present

## 2023-06-04 NOTE — Telephone Encounter (Signed)
 Left message to call back to schedule Watchman consult

## 2023-06-06 DIAGNOSIS — G4733 Obstructive sleep apnea (adult) (pediatric): Secondary | ICD-10-CM | POA: Diagnosis not present

## 2023-06-09 ENCOUNTER — Other Ambulatory Visit: Payer: Self-pay | Admitting: Cardiovascular Disease

## 2023-06-09 ENCOUNTER — Telehealth: Payer: Self-pay

## 2023-06-09 NOTE — Telephone Encounter (Signed)
 Left message to call back

## 2023-06-09 NOTE — Telephone Encounter (Signed)
 [employer] Watchman [employer] report for March 2022 cCT:

## 2023-06-10 NOTE — Telephone Encounter (Signed)
 Scheduled the patient for Watchman consult with Dr. Jimmey Ralph on 06/24/2023. She was grateful for call and agreed with plan.

## 2023-06-24 ENCOUNTER — Ambulatory Visit: Attending: Cardiology | Admitting: Cardiology

## 2023-06-24 ENCOUNTER — Encounter: Payer: Self-pay | Admitting: Cardiology

## 2023-06-24 VITALS — BP 118/82 | HR 52 | Ht 65.0 in | Wt 128.0 lb

## 2023-06-24 DIAGNOSIS — I480 Paroxysmal atrial fibrillation: Secondary | ICD-10-CM

## 2023-06-24 DIAGNOSIS — D6869 Other thrombophilia: Secondary | ICD-10-CM

## 2023-06-24 DIAGNOSIS — I10 Essential (primary) hypertension: Secondary | ICD-10-CM

## 2023-06-24 DIAGNOSIS — I4819 Other persistent atrial fibrillation: Secondary | ICD-10-CM | POA: Diagnosis not present

## 2023-06-24 DIAGNOSIS — Z79899 Other long term (current) drug therapy: Secondary | ICD-10-CM

## 2023-06-24 NOTE — Patient Instructions (Signed)
 Medication Instructions:  Your physician recommends that you continue on your current medications as directed. Please refer to the Current Medication list given to you today.  *If you need a refill on your cardiac medications before your next appointment, please call your pharmacy*  Testing/Procedures: Echocardiogram  Your physician has requested that you have an echocardiogram. Echocardiography is a painless test that uses sound waves to create images of your heart. It provides your doctor with information about the size and shape of your heart and how well your heart's chambers and valves are working. This procedure takes approximately one hour. There are no restrictions for this procedure. Please do NOT wear cologne, perfume, aftershave, or lotions (deodorant is allowed). Please arrive 15 minutes prior to your appointment time.  Watchman  Your physician has requested that you have Left atrial appendage (LAA) closure device implantation is a procedure to put a small device in the LAA of the heart. The LAA is a small sac in the wall of the heart's left upper chamber. Blood clots can form in this area. The device, Watchman closes the LAA to help prevent a blood clot and stroke.   Follow-Up: At [facility], you and your health needs are our priority.  As part of our continuing mission to provide you with exceptional heart care, we have created designated Provider Care Teams.  These Care Teams include your primary Cardiologist (physician) and Advanced Practice Providers (APPs -  Physician Assistants and Nurse Practitioners) who all work together to provide you with the care you need, when you need it.  Your next appointment:   You will be contacted by Nurse Navigator, Karsten Fells to schedule your pre-procedure visit and procedure date. If you have any questions she can be reached at [phone].

## 2023-06-24 NOTE — Progress Notes (Signed)
 Electrophysiology Office Note:   Date:  06/24/2023  ID:  Russia Scheiderer Hillmann, DOB May 26, 1950, MRN [id number]  Primary Cardiologist: Reatha Harps, MD Electrophysiologist: Nobie Putnam, MD      History of Present Illness:   Ashley White is a 73 y.o. female with h/o persistent atrial fibrillation s/p ablation x 2 on flecainide, hypertension, hyperlipidemia, Ehlers-Danlos, history of CVA, and OSA on CPAP who is being seen today for evaluation for Watchman device implant at the request of Dr. Flora Lipps.  Discussed the use of AI scribe software for clinical note transcription with the patient, who gave verbal consent to proceed.  History of Present Illness Ashley White is a 73 year old female with atrial fibrillation and prior stroke who presents for consultation regarding the Watchman device. She was referred by Dr. Guinevere Scarlet for evaluation of the Watchman device. She has a history of atrial fibrillation and a prior stroke, believed to have originated from a clot in the left atrial appendage. She has undergone two prior ablations, which she describes as having been very beneficial. She is currently taking Eliquis and has not experienced any atrial fibrillation recently. No side effects from her current medication. She is concerned about the risk of stroke long-term. She is very active and travels frequently. She had a fall during one of her trips which was very concerning to her and her husband. She is interested in the Watchman device as a means to potentially discontinue blood thinners. No new or acute complaints today.  Review of systems complete and found to be negative unless listed in HPI.   EP Information / Studies Reviewed:    EKG is not ordered today. EKG from 05/30/23 reviewed which showed sinus rhythm with PR and QRS 96ms.      Echo 06/02/20: 1. Left ventricular ejection fraction, by estimation, is 60 to 65%. The  left ventricle has normal function. The left ventricle has no regional  wall  motion abnormalities. Left ventricular diastolic parameters are  consistent with Grade II diastolic  dysfunction (pseudonormalization). Elevated left atrial pressure. The  average left ventricular global longitudinal strain is 21.2 %. The global  longitudinal strain is normal.   2. Right ventricular systolic function is normal. The right ventricular  size is normal. There is normal pulmonary artery systolic pressure.   3. Left atrial size was mildly dilated.   4. The mitral valve is normal in structure. Mild to moderate mitral valve  regurgitation. No evidence of mitral stenosis.   5. The aortic valve is normal in structure. Aortic valve regurgitation is  not visualized. Mild aortic valve sclerosis is present, with no evidence  of aortic valve stenosis.   6. The inferior vena cava is normal in size with greater than 50%  respiratory variability, suggesting right atrial pressure of 3 mmHg.   7. Evidence of atrial level shunting detected by color flow Doppler.  There is a very small secundum atrial septal defect with left to right  shunting across the atrial septum (consistent with post-transseptal  puncture defect).   CT Cardiac 06/13/20: IMPRESSION: 1. There is normal pulmonary vein drainage (3 on the right and 2 on the left) into the left atrium with ostial measurements above.   2. There is no thrombus in the left atrial appendage.   3. The esophagus runs in the left atrial midline and is not in the proximity to any of the pulmonary veins.   4. No PFO/ASD.   5. Normal coronary origin.  Right dominance.   6. CAC score of 0. No CAD in the proximal segments.  Risk Assessment/Calculations:    CHA2DS2-VASc Score = 5   This indicates a 7.2% annual risk of stroke. The patient's score is based upon: CHF History: 0 HTN History: 1 Diabetes History: 0 Stroke History: 2 Vascular Disease History: 0 Age Score: 1 Gender Score: 1             Physical Exam:   VS:  BP 118/82    Pulse (!) 52   Ht 5\' 5"  (1.651 m)   Wt 128 lb (58.1 kg)   SpO2 99%   BMI 21.30 kg/m    Wt Readings from Last 3 Encounters:  06/24/23 128 lb (58.1 kg)  05/30/23 130 lb 6.4 oz (59.1 kg)  01/01/23 134 lb (60.8 kg)     GEN: Well nourished, well developed in no acute distress NECK: No JVD CARDIAC: Bradycardic, regular rhythm.  RESPIRATORY:  Clear to auscultation without rales, wheezing or rhonchi  ABDOMEN: Soft, non-tender, non-distended EXTREMITIES:  No edema; No deformity   ASSESSMENT AND PLAN:    I have seen Ashley White in the office today who is being considered for a Watchman left atrial appendage closure device. I believe they will benefit from this procedure given their history of atrial fibrillation, CHA2DS2-VASc score of 5 and unadjusted ischemic stroke rate of 7.2% per year. Unfortunately, the patient is not felt to be a long term anticoagulation candidate secondary to patient preference and history of falls. The patient's chart has been reviewed and I feel that they would be a candidate for short term oral anticoagulation after Watchman implant.   It is my belief that after undergoing a LAA closure procedure, Ashley White will not need long term anticoagulation which eliminates anticoagulation side effects and major bleeding risk.   Procedural risks for the Watchman implant have been reviewed with the patient including a 0.5% risk of stroke, <1% risk of perforation and <1% risk of device embolization. Other risks include bleeding, vascular damage, tamponade, worsening renal function, and death. The patient understands these risk and wishes to proceed.     The published clinical data on the safety and effectiveness of WATCHMAN include but are not limited to the following: - Holmes DR, Everlene Farrier, Sick P et al. for the PROTECT AF Investigators. Percutaneous closure of the left atrial appendage versus warfarin therapy for prevention of stroke in patients with atrial fibrillation: a  randomised non-inferiority trial. Lancet 2009; 374: 534-42. Everlene Farrier, Doshi SK, Isa Rankin D et al. on behalf of the PROTECT AF Investigators. Percutaneous Left Atrial Appendage Closure for Stroke Prophylaxis in Patients With Atrial Fibrillation 2.3-Year Follow-up of the PROTECT AF (Watchman Left Atrial Appendage System for Embolic Protection in Patients With Atrial Fibrillation) Trial. Circulation 2013; 127:720-729. - Alli O, Doshi S,  Kar S, Reddy VY, Sievert H et al. Quality of Life Assessment in the Randomized PROTECT AF (Percutaneous Closure of the Left Atrial Appendage Versus Warfarin Therapy for Prevention of Stroke in Patients With Atrial Fibrillation) Trial of Patients at Risk for Stroke With Nonvalvular Atrial Fibrillation. J Am Coll Cardiol 2013; 61:1790-8. Aline August DR, Mia [place], Price M, Whisenant B, Sievert H, Doshi S, Huber K, Reddy V. Prospective randomized evaluation of the Watchman left atrial appendage Device in patients with atrial fibrillation versus long-term warfarin therapy; the PREVAIL trial. Journal of the [employer] of Cardiology, Vol. 4, No. 1, 2014, 1-11. - Kar S, Doshi  SK, Sadhu A, Horton R, Osorio J et al. Primary outcome evaluation of a next-generation left atrial appendage closure device: results from the PINNACLE FLX trial. Circulation 2021;143(18)1754-1762.    After today's visit with the patient which was dedicated solely for shared decision making visit regarding LAA closure device, the patient decided to proceed with the LAA appendage closure procedure scheduled to be done in the near future at [facility]. The patient will need an updated echocardiogram.  HAS-BLED score 3 Hypertension Yes  Abnormal renal and liver function (Dialysis, transplant, Cr >2.26 mg/dL /Cirrhosis or Bilirubin >2x Normal or AST/ALT/AP >3x Normal) No  Stroke Yes  Bleeding No  Labile INR (Unstable/high INR) No  Elderly (>65) Yes  Drugs or alcohol (>= 8 drinks/week,  anti-plt or NSAID) No   CHA2DS2-VASc Score = 5  The patient's score is based upon: CHF History: 0 HTN History: 1 Diabetes History: 0 Stroke History: 2 Vascular Disease History: 0 Age Score: 1 Gender Score: 1       ASSESSMENT AND PLAN: Persistent atrial fibrillations/p multiple ablations: High risk medication use: Flecainide. Recent ECG with PR and QRS 96ms.  The patient's CHA2DS2-VASc score is 5, indicating a 7.2% annual risk of stroke. She is predominantly in normal rhythm on flecainide following ablation x 2. Continue flecainide 100mg  twice daily and metop XL 50mg  once daily.   Secondary hypercoagulable state:[id number]} The patient is at significant risk for stroke/thromboembolism based upon her CHA2DS2-VASc Score of 5.  Continue Apixaban (Eliquis).   Hypertension: At goal today.  Recommend checking blood pressures 1-2 times per week at home and recording the values.  Recommend bringing these recordings to the primary care physician.  Signed, Nobie Putnam, MD

## 2023-06-27 DIAGNOSIS — L400 Psoriasis vulgaris: Secondary | ICD-10-CM | POA: Diagnosis not present

## 2023-06-27 DIAGNOSIS — D225 Melanocytic nevi of trunk: Secondary | ICD-10-CM | POA: Diagnosis not present

## 2023-06-27 DIAGNOSIS — Z1283 Encounter for screening for malignant neoplasm of skin: Secondary | ICD-10-CM | POA: Diagnosis not present

## 2023-06-27 DIAGNOSIS — L821 Other seborrheic keratosis: Secondary | ICD-10-CM | POA: Diagnosis not present

## 2023-07-08 ENCOUNTER — Telehealth: Payer: Self-pay | Admitting: Cardiology

## 2023-07-08 MED ORDER — LOSARTAN POTASSIUM 50 MG PO TABS: 50.0000 mg | ORAL_TABLET | Freq: Every day | ORAL | 2 refills | Status: DC

## 2023-07-08 NOTE — Telephone Encounter (Signed)
 Patient identification verified by 2 forms.   Called and spoke to patient  Patient states:  -She received 25 mg tablets. She thought it was two 25 mg tablets daily, she has been taking 2 tables daily. She is running out and wants to get 50 mg tablet sent to her pharmacy.   Reviewed ED warning signs/precautions  Patient agrees with plan, no questions at this time

## 2023-07-08 NOTE — Telephone Encounter (Signed)
 Called pt to let her know Dr. Flora Lipps was okay with sending the 50 mg tablets. Pt is thankful for the change.  Prescription sent to pharmacy.

## 2023-07-08 NOTE — Telephone Encounter (Signed)
 Pt c/o medication issue:  1. Name of Medication: losartan (COZAAR) 25 MG tablet   2. How are you currently taking this medication (dosage and times per day)? N/a  3. Are you having a reaction (difficulty breathing--STAT)? No  4. What is your medication issue? Pt would like to have the dosage changed she would like 50 MG tablet to take once daily

## 2023-07-18 ENCOUNTER — Encounter: Payer: Self-pay | Admitting: Cardiology

## 2023-07-18 ENCOUNTER — Ambulatory Visit (HOSPITAL_COMMUNITY): Attending: Cardiology

## 2023-07-18 DIAGNOSIS — I4819 Other persistent atrial fibrillation: Secondary | ICD-10-CM | POA: Insufficient documentation

## 2023-07-18 LAB — ECHOCARDIOGRAM COMPLETE
Area-P 1/2: 3.43 cm2
S' Lateral: 2.3 cm

## 2023-07-30 DIAGNOSIS — F329 Major depressive disorder, single episode, unspecified: Secondary | ICD-10-CM | POA: Diagnosis not present

## 2023-07-30 DIAGNOSIS — G4730 Sleep apnea, unspecified: Secondary | ICD-10-CM | POA: Diagnosis not present

## 2023-07-31 ENCOUNTER — Telehealth: Payer: Self-pay

## 2023-07-31 ENCOUNTER — Other Ambulatory Visit: Payer: Self-pay | Admitting: Cardiovascular Disease

## 2023-07-31 NOTE — Telephone Encounter (Signed)
-----   Message from Ardeen Kohler sent at 07/18/2023  5:44 PM EDT ----- [name] to proceed with scheduling for Watchman.

## 2023-07-31 NOTE — Telephone Encounter (Signed)
 The patient reported she had an unexpected separation from her husband and does not wish to proceed with LAAO at this time. She will call if she changes her mind. She was grateful for assistance.

## 2023-08-11 ENCOUNTER — Other Ambulatory Visit: Payer: Self-pay | Admitting: Cardiovascular Disease

## 2023-08-11 DIAGNOSIS — I480 Paroxysmal atrial fibrillation: Secondary | ICD-10-CM

## 2023-08-11 NOTE — Telephone Encounter (Signed)
 Prescription refill request for Eliquis  received. Indication: AF Last office visit: 06/24/23  Calvin Caulk MD Scr: 0.82 on 01/25/22  Epic Age: 73 Weight: 58.1kg  Based on above findings Eliquis  5mg  twice daily is the appropriate dose.  Pt is past due for CBC/BMP for Eliquis  refills.  Message sent to Butch Cashing RN to arrange. Refill approved x 1.

## 2023-08-12 DIAGNOSIS — Z6826 Body mass index (BMI) 26.0-26.9, adult: Secondary | ICD-10-CM | POA: Diagnosis not present

## 2023-08-12 DIAGNOSIS — Z124 Encounter for screening for malignant neoplasm of cervix: Secondary | ICD-10-CM | POA: Diagnosis not present

## 2023-08-12 DIAGNOSIS — Z01419 Encounter for gynecological examination (general) (routine) without abnormal findings: Secondary | ICD-10-CM | POA: Diagnosis not present

## 2023-08-13 NOTE — Telephone Encounter (Unsigned)
 Patient identification verified by 2 forms. Sims Duck, RN     Tried calling patient

## 2023-08-18 DIAGNOSIS — G4733 Obstructive sleep apnea (adult) (pediatric): Secondary | ICD-10-CM | POA: Diagnosis not present

## 2023-08-30 ENCOUNTER — Other Ambulatory Visit: Payer: Self-pay | Admitting: Cardiovascular Disease

## 2023-09-18 DIAGNOSIS — G4733 Obstructive sleep apnea (adult) (pediatric): Secondary | ICD-10-CM | POA: Diagnosis not present

## 2023-09-22 DIAGNOSIS — Z9011 Acquired absence of right breast and nipple: Secondary | ICD-10-CM | POA: Diagnosis not present

## 2023-09-22 DIAGNOSIS — C50912 Malignant neoplasm of unspecified site of left female breast: Secondary | ICD-10-CM | POA: Diagnosis not present

## 2023-09-23 DIAGNOSIS — Z9011 Acquired absence of right breast and nipple: Secondary | ICD-10-CM | POA: Diagnosis not present

## 2023-09-23 DIAGNOSIS — C50912 Malignant neoplasm of unspecified site of left female breast: Secondary | ICD-10-CM | POA: Diagnosis not present

## 2023-10-18 DIAGNOSIS — G4733 Obstructive sleep apnea (adult) (pediatric): Secondary | ICD-10-CM | POA: Diagnosis not present

## 2023-10-29 ENCOUNTER — Ambulatory Visit: Admitting: Internal Medicine

## 2023-10-29 ENCOUNTER — Encounter: Payer: Self-pay | Admitting: Internal Medicine

## 2023-10-29 VITALS — BP 126/62 | HR 77 | Ht 65.0 in | Wt 121.0 lb

## 2023-10-29 DIAGNOSIS — R634 Abnormal weight loss: Secondary | ICD-10-CM

## 2023-10-29 DIAGNOSIS — K529 Noninfective gastroenteritis and colitis, unspecified: Secondary | ICD-10-CM

## 2023-10-29 DIAGNOSIS — K8689 Other specified diseases of pancreas: Secondary | ICD-10-CM

## 2023-10-29 MED ORDER — ZENPEP 40000-126000 UNITS PO CPEP: ORAL_CAPSULE | ORAL | 11 refills | Status: AC

## 2023-10-29 NOTE — Patient Instructions (Signed)
 We have sent the following medications to your pharmacy for you to pick up at your convenience:  Zenpep   Please follow up in one year.  _______________________________________________________  If your blood pressure at your visit was 140/90 or greater, please contact your primary care physician to follow up on this.  _______________________________________________________  If you are age 72 or older, your body mass index should be between 23-30. Your Body mass index is 20.14 kg/m. If this is out of the aforementioned range listed, please consider follow up with your Primary Care Provider.  If you are age 81 or younger, your body mass index should be between 19-25. Your Body mass index is 20.14 kg/m. If this is out of the aformentioned range listed, please consider follow up with your Primary Care Provider.   ________________________________________________________  The [facility] providers would like to encourage you to use MYCHART to communicate with providers for non-urgent requests or questions.  Due to long hold times on the telephone, sending your provider a message by [facility] may be a faster and more efficient way to get a response.  Please allow 48 business hours for a response.  Please remember that this is for non-urgent requests.  _______________________________________________________  [facility] is using a team-based approach to care.  Your team is made up of your doctor and two to three APPS. Our APPS (Nurse Practitioners and Physician Assistants) work with your physician to ensure care continuity for you. They are fully qualified to address your health concerns and develop a treatment plan. They communicate directly with your gastroenterologist to care for you. Seeing the Advanced Practice Practitioners on your physician's team can help you by facilitating care more promptly, often allowing for earlier appointments, access to diagnostic testing, procedures, and other  specialty referrals.

## 2023-10-29 NOTE — Progress Notes (Signed)
 HISTORY OF PRESENT ILLNESS:  Ashley White is a 73 y.o. female who presents today for follow-up regarding management of chronic diarrhea.  She was last seen January 01, 2023.  See that dictation for details.  Her symptoms were seemingly improved after pancreatic enzyme supplementation.  This raise the possibility of pancreatic insufficiency.  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  Prior colonoscopy with biopsies was negative as well as more recent virtual colonoscopy 2023 being negative.  At the time of her last visit she was continued on Zenpep .  Testing for celiac disease was negative.  Follow-up in 6 months requested.  She presents for follow-up at this time.  She continues on pancreatic enzyme supplementation.  With this, no further issues with diarrhea.  She reports formed bowel movements daily.  No issues with abdominal pain or bleeding.  She does tell me that she is lost about 10 pounds over the past year.  She attributes this to stress, including being separated from her husband.  She does report stable weight in recent months.  She does request pancreatic enzyme supplement medication refill  REVIEW OF SYSTEMS:  All non-GI ROS negative. Past Medical History:  Diagnosis Date   ADD (attention deficit disorder)    Arrhythmia    Cancer (HCC) 2001   left breast, treated with lumpectomy, radiation, and chemotherapy   Depression    Mitral valve prolapse    Myocarditis (HCC)    at age 77   Paroxysmal atrial fibrillation (HCC)    Sleep apnea    uses CPAP followed by Dr Tammy   Stroke [facility]) 2015    Past Surgical History:  Procedure Laterality Date   ATRIAL FIBRILLATION ABLATION N/A 06/20/2020   Procedure: ATRIAL FIBRILLATION ABLATION;  Surgeon: Kelsie Agent, MD;  Location: [facility] INVASIVE CV LAB;  Service: Cardiovascular;  Laterality: N/A;   BREAST LUMPECTOMY  2001   malignancy removed   BREAST LUMPECTOMY WITH RADIOACTIVE SEED LOCALIZATION  Right 08/09/2013   benign   BREAST SURGERY  1993   left cyst removed   BUBBLE STUDY  02/03/2020   Procedure: BUBBLE STUDY;  Surgeon: Loni Soyla LABOR, MD;  Location: [facility] ENDOSCOPY;  Service: Cardiovascular;;   CARDIOVERSION N/A 02/03/2020   Procedure: CARDIOVERSION;  Surgeon: Loni Soyla LABOR, MD;  Location: [facility] ENDOSCOPY;  Service: Cardiovascular;  Laterality: N/A;   CATARACT EXTRACTION     ELECTROPHYSIOLOGIC STUDY N/A 01/09/2016   Procedure: Atrial Fibrillation Ablation;  Surgeon: Agent Kelsie, MD;  Location: [facility] INVASIVE CV LAB;  Service: Cardiovascular;  Laterality: N/A;   OOPHORECTOMY Left    right knee tibia surgery     2006   TEE WITHOUT CARDIOVERSION N/A 02/03/2020   Procedure: TRANSESOPHAGEAL ECHOCARDIOGRAM (TEE);  Surgeon: Loni Soyla LABOR, MD;  Location: [facility] ENDOSCOPY;  Service: Cardiovascular;  Laterality: N/A;    Social History Ashley White  reports that she has never smoked. She has never used smokeless tobacco. She reports current alcohol use of about 1.0 standard drink of alcohol per week. She reports that she does not use drugs.  family history includes Breast cancer in her paternal grandmother; Colon cancer in her father; Heart disease in her mother.  Allergies  Allergen Reactions   Penicillin G Other (See Comments)    Childhood   Nickel Itching and Rash       PHYSICAL EXAMINATION: Vital signs: BP 126/62   Pulse 77   Ht 5' 5 (1.651 m)   Wt 121 lb (54.9 kg)  BMI 20.14 kg/m   Constitutional: Pleasant, thin, generally well-appearing, no acute distress Psychiatric: alert and oriented x3, cooperative Eyes: extraocular movements intact, anicteric, conjunctiva pink Mouth: oral pharynx moist, no lesions Neck: supple no lymphadenopathy Cardiovascular: heart regular rate and rhythm, no murmur Lungs: clear to auscultation bilaterally Abdomen: soft, nontender, nondistended, no obvious ascites, no peritoneal signs, normal bowel sounds, no organomegaly Rectal:  Omitted Extremities: no clubbing, cyanosis, or lower extremity edema bilaterally Skin: no lesions on visible extremities Neuro: No focal deficits. No asterixis.    ASSESSMENT:   1.  Chronic diarrhea.  Durable improvement after pancreatic enzyme supplementation initiated.  Possible etiologies include true pancreatic insufficiency, versus postinfectious IBS exacerbating chronic IBS-now better. 2.  Prior colonoscopy with biopsies negative. 3.  Virtual colonoscopy June 2023 was negative 4.  Negative celiac testing 5.  Weight loss.  Situational.  No evidence for malabsorption  PLAN:   1.  Refills Zenpep .  Medication risk reviewed  2.  Continue Zenpep .  2 with meals and 1 with snacks 3.  Routine office follow-up 1 year A total time of 30 minutes was spent preparing to see the patient, obtaining interval history, performing medically appropriate physical examination, counseling educating patient regarding above listed issues, answering all questions, directing medical therapy, ordering medication, and documenting clinical information in the health record

## 2023-10-31 DIAGNOSIS — F909 Attention-deficit hyperactivity disorder, unspecified type: Secondary | ICD-10-CM | POA: Diagnosis not present

## 2023-10-31 DIAGNOSIS — F329 Major depressive disorder, single episode, unspecified: Secondary | ICD-10-CM | POA: Diagnosis not present

## 2023-10-31 DIAGNOSIS — G4730 Sleep apnea, unspecified: Secondary | ICD-10-CM | POA: Diagnosis not present

## 2023-11-13 ENCOUNTER — Other Ambulatory Visit: Payer: Self-pay | Admitting: Cardiovascular Disease

## 2023-11-13 DIAGNOSIS — I480 Paroxysmal atrial fibrillation: Secondary | ICD-10-CM

## 2023-11-13 NOTE — Telephone Encounter (Signed)
 Prescription refill request for Eliquis  received. Indication: afib  Last office visit: Kennyth 06/24/2023 Scr: 0.78, 06/02/2023 Age: 73 yo  Weight: 54.9 kg   Refill sent.

## 2023-11-19 DIAGNOSIS — G4733 Obstructive sleep apnea (adult) (pediatric): Secondary | ICD-10-CM | POA: Diagnosis not present

## 2023-12-18 DIAGNOSIS — G4730 Sleep apnea, unspecified: Secondary | ICD-10-CM | POA: Diagnosis not present

## 2023-12-18 DIAGNOSIS — F909 Attention-deficit hyperactivity disorder, unspecified type: Secondary | ICD-10-CM | POA: Diagnosis not present

## 2023-12-18 DIAGNOSIS — F329 Major depressive disorder, single episode, unspecified: Secondary | ICD-10-CM | POA: Diagnosis not present

## 2024-01-13 DIAGNOSIS — G4733 Obstructive sleep apnea (adult) (pediatric): Secondary | ICD-10-CM | POA: Diagnosis not present

## 2024-01-19 DIAGNOSIS — G4733 Obstructive sleep apnea (adult) (pediatric): Secondary | ICD-10-CM | POA: Diagnosis not present

## 2024-01-26 DIAGNOSIS — G4733 Obstructive sleep apnea (adult) (pediatric): Secondary | ICD-10-CM | POA: Diagnosis not present

## 2024-01-30 ENCOUNTER — Other Ambulatory Visit: Payer: Self-pay | Admitting: Cardiovascular Disease

## 2024-02-19 DIAGNOSIS — F329 Major depressive disorder, single episode, unspecified: Secondary | ICD-10-CM | POA: Diagnosis not present

## 2024-02-19 DIAGNOSIS — G4730 Sleep apnea, unspecified: Secondary | ICD-10-CM | POA: Diagnosis not present

## 2024-02-19 DIAGNOSIS — F909 Attention-deficit hyperactivity disorder, unspecified type: Secondary | ICD-10-CM | POA: Diagnosis not present

## 2024-04-08 ENCOUNTER — Other Ambulatory Visit: Payer: Self-pay | Admitting: Cardiovascular Disease

## 2024-04-29 ENCOUNTER — Other Ambulatory Visit: Payer: Self-pay | Admitting: Cardiovascular Disease

## 2024-05-05 IMAGING — CT CT VIRTUAL COLONOSCOPY SCREENING
4 of 13 series · 11 of 46 positions shown, 17 images · non-contrast
Comparison: 02/07/2015.

CLINICAL DATA: Family history of colon polyps. Preventive
healthcare. History of breast cancer.

EXAM:
CT VIRTUAL COLONOSCOPY SCREENING
TECHNIQUE: The patient was given a standard LoSo bowel preparation with
Gastrografin and barium for fluid and stool tagging respectively.
The quality of the bowel preparation is excellent. Automated CO2
insufflation of the colon was performed prior to image acquisition
and colonic distention is excellent. Image post processing was used
to generate a 3D endoluminal fly-through projection of the colon and
to electronically subtract stool/fluid as appropriate.

[Series 12: prone colon 1.50 br40 s3 prone thin · axial · 0.89mm/px · z∈[+1287,+1374]mm · 2 of 348 slices shown]
[im 58/348  soft-tissue]
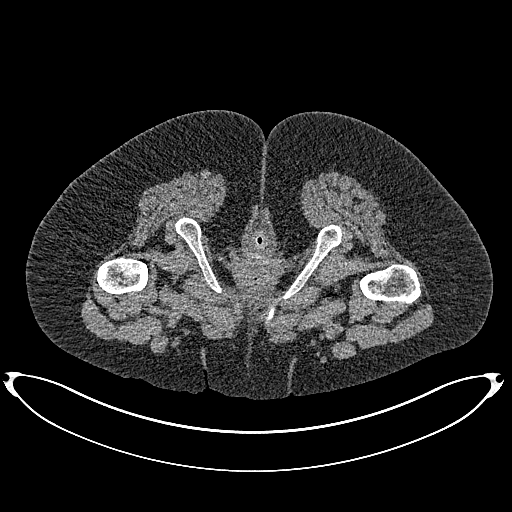
[im 116/348  soft-tissue]
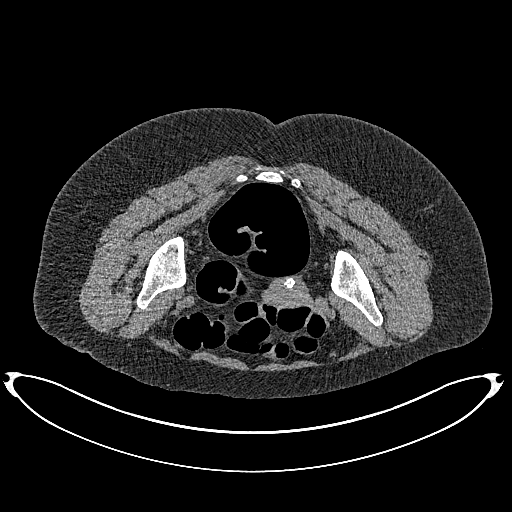

[Series 18: rt decub colon 1.50 br40 s3 rt decub thin · axial · 0.75mm/px · z∈[+1270,+1633]mm · 5 of 364 slices shown, 10 images]
[im 61/364  soft-tissue]
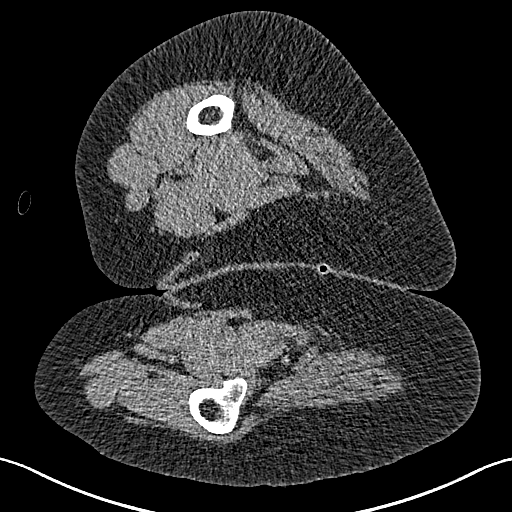
[im 61/364  bone]
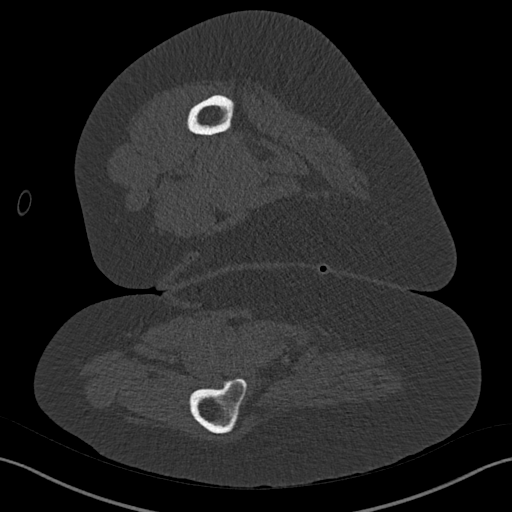
[im 122/364  soft-tissue]
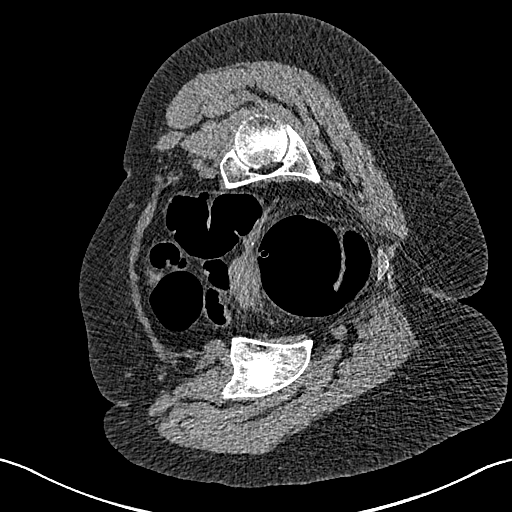
[im 122/364  lung]
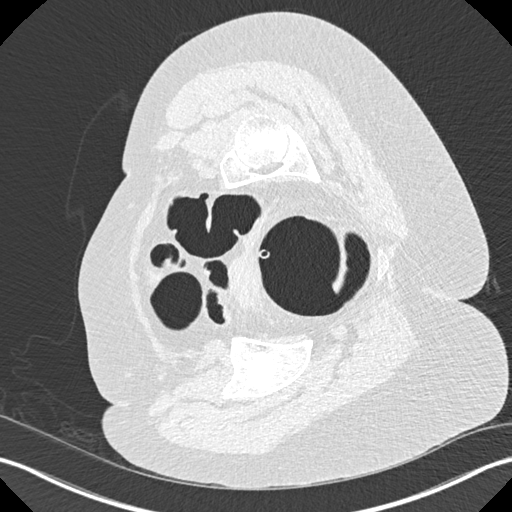
[im 182/364  soft-tissue]
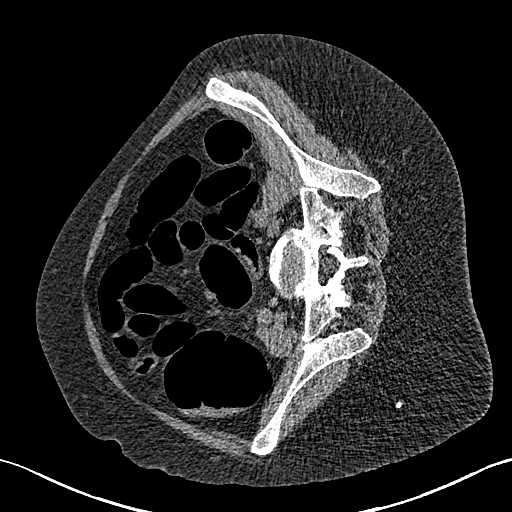
[im 182/364  lung]
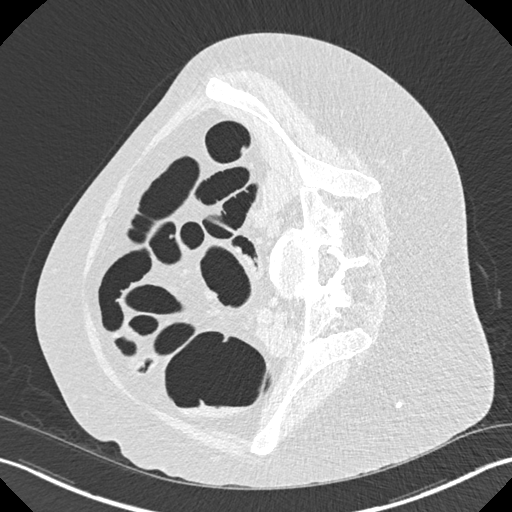
[im 243/364  soft-tissue]
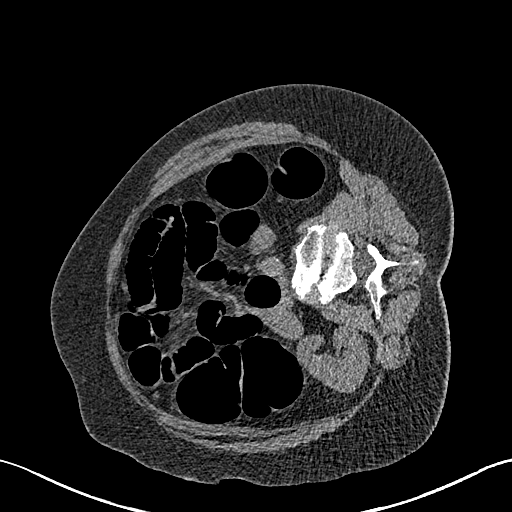
[im 243/364  lung]
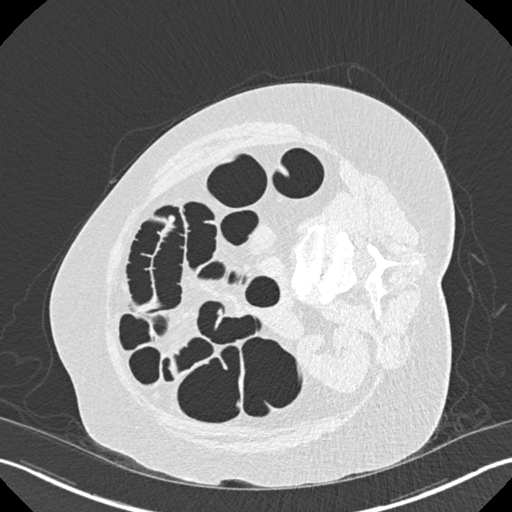
[im 303/364  soft-tissue]
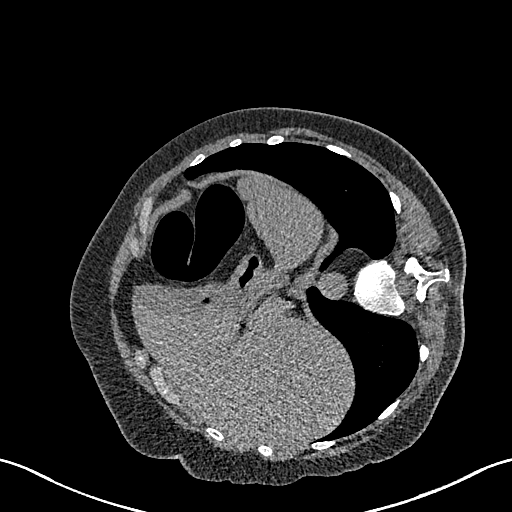
[im 303/364  lung]
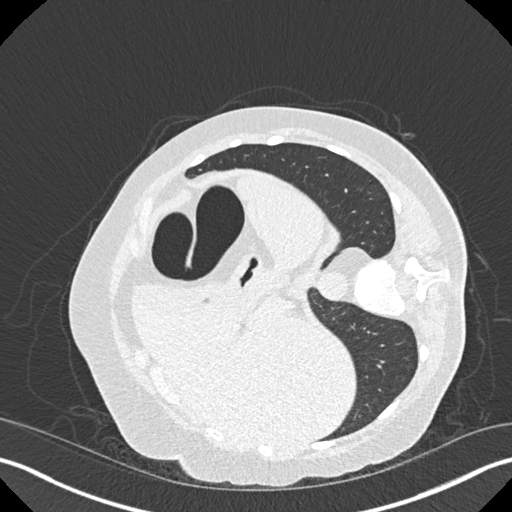

[Series 19: rt decub colon 3.00 br40 s3 rt decub 3mm · axial · 0.75mm/px · z∈[+1361,+1541]mm · 2 of 182 slices shown]
[im 61/182  soft-tissue]
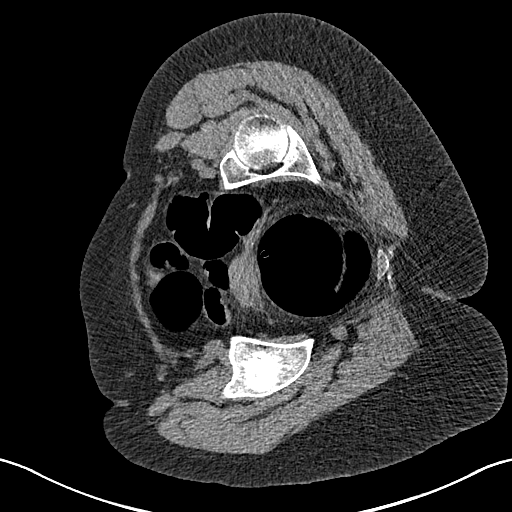
[im 121/182  soft-tissue]
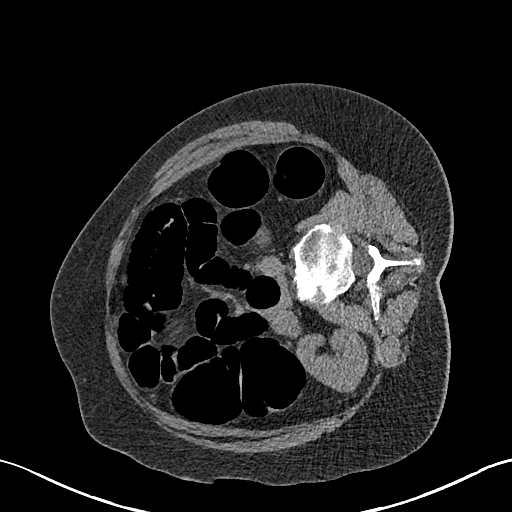

[Series 20: rt decub colon 3.00 br40 s3 cor rt decub · coronal · 0.75mm/px · 2 of 122 slices shown, 3 images]
[im 41/122  soft-tissue]
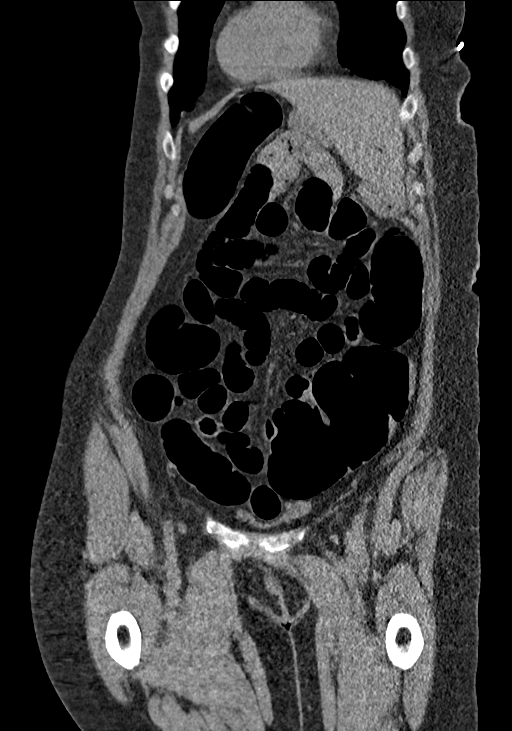
[im 41/122  bone]
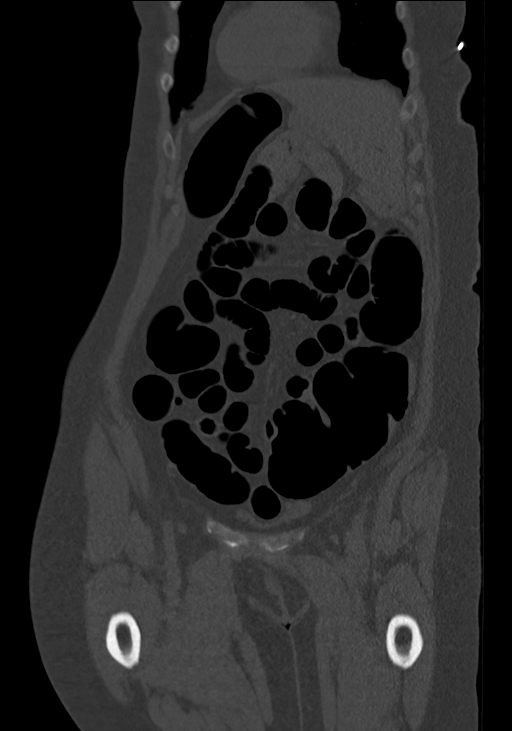
[im 81/122  soft-tissue]
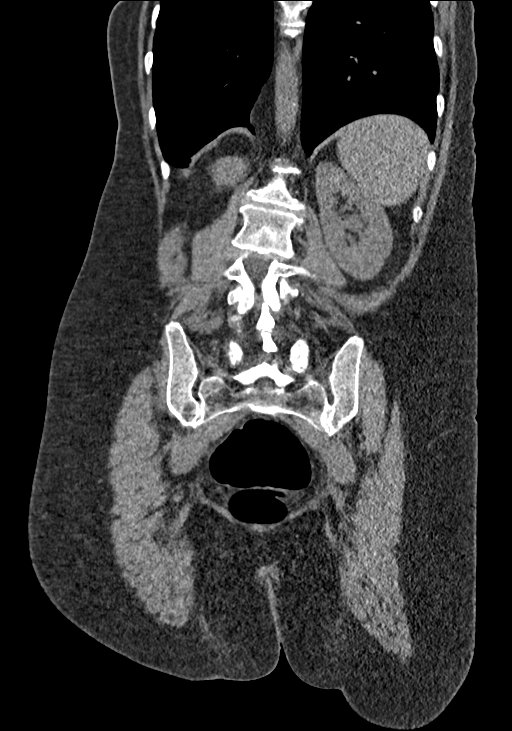

[11 of 46 positions shown; findings below may reference images not displayed]

FINDINGS: VIRTUAL COLONOSCOPY

No polyp, mass or stricture.

Virtual colonoscopy is not designed to detect diminutive polyps
(i.e., less than or equal to 5 mm), the presence or absence of which
may not affect clinical management.

CT ABDOMEN AND PELVIS WITHOUT CONTRAST

Lower chest: No acute findings. Heart size normal. No pericardial or
pleural effusion. Moderate hiatal hernia.

Hepatobiliary: Liver and gallbladder are unremarkable. No biliary
ductal dilatation.

Pancreas: Negative.

Spleen: Negative.

Adrenals/Urinary Tract: Adrenal glands and right kidney are
unremarkable. Exophytic low-attenuation lesion off the interpolar
left kidney measures 1.7 cm, unchanged from 02/07/2015. No specific
follow-up necessary. Ureters are decompressed. Bladder is low in
volume.

Stomach/Bowel: Moderate hiatal hernia. Stomach, small bowel and
appendix are otherwise unremarkable. Colon is discussed in the
virtual colonoscopy section of this report.

Vascular/Lymphatic: Minimal atherosclerotic calcification of the
aorta. No pathologically enlarged lymph nodes.

Reproductive: Calcified uterine fibroids.  No adnexal mass.

Other: No free fluid.  Mesenteries and peritoneum are unremarkable.

Musculoskeletal: Degenerative changes in the spine. Levoconvex
scoliosis. Degenerative changes in the hips. No worrisome lytic or
sclerotic lesions.
IMPRESSION: 1. Excellent quality study.  No polyp, stricture or mass.
2. Moderate hiatal hernia.
3.  Aortic atherosclerosis ([id number]).
